# Patient Record
Sex: Male | Born: 1950
Health system: Southern US, Community
[De-identification: ages and names within clinical notes are randomized; demographics above are authoritative.]

## PROBLEM LIST (undated history)

## (undated) DIAGNOSIS — I10 Essential (primary) hypertension: Secondary | ICD-10-CM

## (undated) DIAGNOSIS — N182 Chronic kidney disease, stage 2 (mild): Secondary | ICD-10-CM

## (undated) DIAGNOSIS — I255 Ischemic cardiomyopathy: Secondary | ICD-10-CM

## (undated) DIAGNOSIS — I2510 Atherosclerotic heart disease of native coronary artery without angina pectoris: Secondary | ICD-10-CM

## (undated) DIAGNOSIS — I739 Peripheral vascular disease, unspecified: Secondary | ICD-10-CM

## (undated) DIAGNOSIS — E119 Type 2 diabetes mellitus without complications: Secondary | ICD-10-CM

## (undated) DIAGNOSIS — I714 Abdominal aortic aneurysm, without rupture, unspecified: Secondary | ICD-10-CM

## (undated) DIAGNOSIS — E785 Hyperlipidemia, unspecified: Secondary | ICD-10-CM

## (undated) HISTORY — DX: Hyperlipidemia, unspecified: E78.5

## (undated) HISTORY — DX: Ischemic cardiomyopathy: I25.5

## (undated) HISTORY — DX: Abdominal aortic aneurysm, without rupture, unspecified: I71.40

## (undated) HISTORY — DX: Atherosclerotic heart disease of native coronary artery without angina pectoris: I25.10

## (undated) HISTORY — DX: Peripheral vascular disease, unspecified: I73.9

## (undated) HISTORY — DX: Essential (primary) hypertension: I10

## (undated) HISTORY — DX: Type 2 diabetes mellitus without complications: E11.9

---

## 2011-09-16 ENCOUNTER — Emergency Department: Payer: Self-pay | Admitting: Emergency Medicine

## 2015-07-20 ENCOUNTER — Telehealth: Payer: Self-pay | Admitting: Family Medicine

## 2015-07-20 NOTE — Telephone Encounter (Signed)
Last seen 08-04-14: patient is requesting refill on Metformin, Losartan, and Amlodipine. Requesting that you send to [facility] (he will be home tomorrow). Informed patient that he should schedule an appointment but he said he works out of town.

## 2015-07-21 NOTE — Telephone Encounter (Signed)
Daniel Fuentes spoke with patient and he stated that he need a Friday appointment that he will call back later

## 2015-07-21 NOTE — Telephone Encounter (Signed)
Must be seen

## 2015-08-21 ENCOUNTER — Ambulatory Visit (INDEPENDENT_AMBULATORY_CARE_PROVIDER_SITE_OTHER): Payer: BLUE CROSS/BLUE SHIELD | Admitting: Family Medicine

## 2015-08-21 ENCOUNTER — Encounter: Payer: Self-pay | Admitting: Family Medicine

## 2015-08-21 VITALS — BP 120/70 | HR 92 | Temp 98.0°F | Resp 16 | Ht 68.0 in | Wt 203.4 lb

## 2015-08-21 DIAGNOSIS — E1129 Type 2 diabetes mellitus with other diabetic kidney complication: Secondary | ICD-10-CM | POA: Diagnosis not present

## 2015-08-21 DIAGNOSIS — E1169 Type 2 diabetes mellitus with other specified complication: Secondary | ICD-10-CM | POA: Diagnosis not present

## 2015-08-21 DIAGNOSIS — I10 Essential (primary) hypertension: Secondary | ICD-10-CM

## 2015-08-21 DIAGNOSIS — Z23 Encounter for immunization: Secondary | ICD-10-CM

## 2015-08-21 DIAGNOSIS — E785 Hyperlipidemia, unspecified: Secondary | ICD-10-CM

## 2015-08-21 DIAGNOSIS — H6121 Impacted cerumen, right ear: Secondary | ICD-10-CM

## 2015-08-21 DIAGNOSIS — H918X1 Other specified hearing loss, right ear: Secondary | ICD-10-CM | POA: Diagnosis not present

## 2015-08-21 DIAGNOSIS — R809 Proteinuria, unspecified: Secondary | ICD-10-CM

## 2015-08-21 DIAGNOSIS — E1121 Type 2 diabetes mellitus with diabetic nephropathy: Secondary | ICD-10-CM | POA: Insufficient documentation

## 2015-08-21 LAB — POCT UA - MICROALBUMIN: Microalbumin Ur, POC: 100 mg/L

## 2015-08-21 LAB — POCT GLYCOSYLATED HEMOGLOBIN (HGB A1C): Hemoglobin A1C: 6.7

## 2015-08-21 LAB — GLUCOSE, POCT (MANUAL RESULT ENTRY): POC Glucose: 144 mg/dl — AB (ref 70–99)

## 2015-08-21 MED ORDER — LOSARTAN POTASSIUM-HCTZ 100-25 MG PO TABS: 1.0000 | ORAL_TABLET | Freq: Every day | ORAL | Status: DC

## 2015-08-21 MED ORDER — AMLODIPINE BESYLATE 10 MG PO TABS: 10.0000 mg | ORAL_TABLET | Freq: Every day | ORAL | Status: DC

## 2015-08-21 MED ORDER — METFORMIN HCL 1000 MG PO TABS: 1000.0000 mg | ORAL_TABLET | Freq: Two times a day (BID) | ORAL | Status: DC

## 2015-08-21 NOTE — Progress Notes (Signed)
Name: Daniel Fuentes   MRN: [id number]    DOB: 1951/09/10   Date:08/21/2015       Progress Note  Subjective  Chief Complaint  Chief Complaint  Patient presents with  . Hypertension    6 month recheck  . Diabetes  . Hyperlipidemia    HPI  Diabetes  Patient presents for follow-up of diabetes which is present for over 5 years. Is currently on a regimen of metformin 1000 mg daily . Patient states some compliance with their diet and exercise. There's been no hypoglycemic episodes and there is no polyuria polydipsia polyphagia. His average fasting glucoses been in the low around -- with a high around - . There is no end organ disease.  Last diabetic eye exam was one year ago.   Last visit with dietitian was ordered in 1 year ago. . Last microalbumin was obtained today and is 100 .   Hyperlipidemia  Patient has a history of hyperlipidemia for over 5 years.  Current medical regimen consist of  .  Com simvastatin 20 mg daily at bedtimepliaGood .  Diet and exercise are currently followeintermittently Risk factors for cardiovascular disease include hyperlidiabetes and hypertension  There have been no side effects from the medication.    Hypertension   Patient presents for follow-up of hypertension. It has been present for oveover 5ars.  Patient states that there is compliance with medical regimen which consistlosartan HCT 100-25 once daily and amlodipine 10 mg daily . There is no end organ disease. Cardiac risk factors include hypertension hyperlipidemia and diabetes.  Exercise regimen consistminimal walking low-salt .  Diet consist of some salt restriction  Cerumen impaction  Patient complains of bilateral ear fullness. He is using earwax softener on the left. He has noted a decrease in his hearing. This been present over the past several months.    Past Medical History  Diagnosis Date  . Hyperlipidemia   . Hypertension   . Diabetes mellitus without complication [facility])     Social History   Substance Use Topics  . Smoking status: Current Some Day Smoker  . Smokeless tobacco: Never Used  . Alcohol Use: No     Current outpatient prescriptions:  .  amLODipine (NORVASC) 10 MG tablet, Take 1 tablet (10 mg total) by mouth daily., Disp: 30 tablet, Rfl: 5 .  losartan-hydrochlorothiazide (HYZAAR) 100-25 MG tablet, Take 1 tablet by mouth daily., Disp: 30 tablet, Rfl: 5 .  metFORMIN (GLUCOPHAGE) 1000 MG tablet, Take 1 tablet (1,000 mg total) by mouth 2 (two) times daily with a meal., Disp: 60 tablet, Rfl: 5  No Known Allergies  Review of Systems  Constitutional: Negative for fever, chills and weight loss.  HENT: Positive for hearing loss. Negative for congestion, sore throat and tinnitus.   Eyes: Negative for blurred vision, double vision and redness.  Respiratory: Negative for cough, hemoptysis and shortness of breath.   Cardiovascular: Negative for chest pain, palpitations, orthopnea, claudication and leg swelling.  Gastrointestinal: Negative for heartburn, nausea, vomiting, diarrhea, constipation and blood in stool.  Genitourinary: Negative for dysuria, urgency, frequency and hematuria.  Musculoskeletal: Negative for myalgias, back pain, joint pain, falls and neck pain.  Skin: Negative for itching.  Neurological: Negative for dizziness, tingling, tremors, focal weakness, seizures, loss of consciousness, weakness and headaches.  Endo/Heme/Allergies: Does not bruise/bleed easily.  Psychiatric/Behavioral: Negative for depression and substance abuse. The patient is not nervous/anxious and does not have insomnia.      Objective  Filed Vitals:   08/21/15 [id number]  BP: 120/70  Pulse: 92  Temp: 98 F (36.7 C)  TempSrc: Oral  Resp: 16  Height: 5\' 8"  (1.727 m)  Weight: 203 lb 6.4 oz (92.262 kg)  SpO2: 98%     Physical Exam  Constitutional: He is oriented to person, place, and time.  Obese and in no acute distress  HENT:  Head: Normocephalic.  Bilateral cerumen impaction   Eyes: EOM are normal. Pupils are equal, round, and reactive to light.  Neck: Normal range of motion. Neck supple. No thyromegaly present.  Cardiovascular: Normal rate, regular rhythm, normal heart sounds and intact distal pulses.   No murmur heard. Pulmonary/Chest: Effort normal and breath sounds normal. No respiratory distress. He has no wheezes.  Abdominal: Soft. Bowel sounds are normal.  Musculoskeletal: Normal range of motion. He exhibits no edema.  Lymphadenopathy:    He has no cervical adenopathy.  Neurological: He is alert and oriented to person, place, and time. No cranial nerve deficit. Gait normal. Coordination normal.  Skin: Skin is warm and dry. No rash noted.  Psychiatric: Affect and judgment normal.      Assessment & Plan   1. Type 2 diabetes mellitus with other specified complication (HCC) Well-controlled - POCT HgB A1C - POCT Glucose (CBG) - POCT UA - Microalbumin - PSA  2. Need for influenza vaccination Given today - Flu vaccine HIGH DOSE PF (Fluzone High dose)  3. Diabetes mellitus with microalbuminuria (HCC) Continue on and aggressive hypertension and diabetes control  4. Essential hypertension Well-controlled - Comprehensive metabolic panel  5. Hyperlipidemia Repeat labs - Lipid panel - TSH  6. Hearing loss of right ear due to cerumen impaction Ear lavage

## 2015-08-22 LAB — COMPREHENSIVE METABOLIC PANEL
ALT: 21 IU/L (ref 0–44)
AST: 43 IU/L — AB (ref 0–40)
Albumin/Globulin Ratio: 1.9 (ref 1.1–2.5)
Albumin: 4.7 g/dL (ref 3.6–4.8)
Alkaline Phosphatase: 77 IU/L (ref 39–117)
BUN/Creatinine Ratio: 15 (ref 10–22)
BUN: 18 mg/dL (ref 8–27)
Bilirubin Total: 0.9 mg/dL (ref 0.0–1.2)
CALCIUM: 10.1 mg/dL (ref 8.6–10.2)
CO2: 25 mmol/L (ref 18–29)
Chloride: 97 mmol/L (ref 97–106)
Creatinine, Ser: 1.21 mg/dL (ref 0.76–1.27)
GFR, EST AFRICAN AMERICAN: 72 mL/min/{1.73_m2} (ref >59)
GFR, EST NON AFRICAN AMERICAN: 62 mL/min/{1.73_m2} (ref >59)
GLUCOSE: 133 mg/dL — AB (ref 65–99)
Globulin, Total: 2.5 g/dL (ref 1.5–4.5)
Potassium: 4.1 mmol/L (ref 3.5–5.2)
Sodium: 138 mmol/L (ref 136–144)
TOTAL PROTEIN: 7.2 g/dL (ref 6.0–8.5)

## 2015-08-22 LAB — LIPID PANEL
CHOL/HDL RATIO: 4.4 ratio (ref 0.0–5.0)
Cholesterol, Total: 231 mg/dL — ABNORMAL HIGH (ref 100–199)
HDL: 52 mg/dL (ref >39)
LDL Calculated: 156 mg/dL — ABNORMAL HIGH (ref 0–99)
TRIGLYCERIDES: 117 mg/dL (ref 0–149)
VLDL Cholesterol Cal: 23 mg/dL (ref 5–40)

## 2015-08-22 LAB — PSA: Prostate Specific Ag, Serum: 3.3 ng/mL (ref 0.0–4.0)

## 2015-08-22 LAB — TSH: TSH: 0.983 u[IU]/mL (ref 0.450–4.500)

## 2015-08-28 ENCOUNTER — Other Ambulatory Visit: Payer: Self-pay

## 2015-08-28 MED ORDER — LOSARTAN POTASSIUM-HCTZ 100-25 MG PO TABS: 1.0000 | ORAL_TABLET | Freq: Every day | ORAL | Status: DC

## 2015-08-28 MED ORDER — METFORMIN HCL 1000 MG PO TABS: 1000.0000 mg | ORAL_TABLET | Freq: Two times a day (BID) | ORAL | Status: DC

## 2015-08-30 ENCOUNTER — Telehealth: Payer: Self-pay | Admitting: Emergency Medicine

## 2015-08-30 NOTE — Telephone Encounter (Signed)
Patient notified of lab results. Had been without medication

## 2015-09-07 ENCOUNTER — Telehealth: Payer: Self-pay | Admitting: Family Medicine

## 2015-09-07 NOTE — Telephone Encounter (Signed)
Pt would like a call back

## 2015-09-13 ENCOUNTER — Telehealth: Payer: Self-pay | Admitting: Family Medicine

## 2015-09-13 NOTE — Telephone Encounter (Signed)
Pt would like a call back

## 2015-09-14 MED ORDER — SILDENAFIL CITRATE 100 MG PO TABS: 50.0000 mg | ORAL_TABLET | Freq: Every day | ORAL | Status: DC | PRN

## 2015-09-14 NOTE — Telephone Encounter (Signed)
Patient called and would like a script sent to Pharmacy for Viagra. Script sent per Dr. Rutherford Nail. Patient notified

## 2015-10-01 HISTORY — PX: BRAIN SURGERY: SHX531

## 2015-11-06 ENCOUNTER — Encounter: Payer: Self-pay | Admitting: Family Medicine

## 2015-12-22 ENCOUNTER — Ambulatory Visit: Payer: BLUE CROSS/BLUE SHIELD | Admitting: Family Medicine

## 2016-03-21 ENCOUNTER — Ambulatory Visit (INDEPENDENT_AMBULATORY_CARE_PROVIDER_SITE_OTHER): Payer: BLUE CROSS/BLUE SHIELD | Admitting: Family Medicine

## 2016-03-21 ENCOUNTER — Encounter: Payer: Self-pay | Admitting: Family Medicine

## 2016-03-21 VITALS — BP 128/78 | HR 78 | Temp 98.5°F | Resp 18 | Ht 68.0 in | Wt 197.3 lb

## 2016-03-21 DIAGNOSIS — J309 Allergic rhinitis, unspecified: Secondary | ICD-10-CM | POA: Diagnosis not present

## 2016-03-21 DIAGNOSIS — I10 Essential (primary) hypertension: Secondary | ICD-10-CM | POA: Diagnosis not present

## 2016-03-21 DIAGNOSIS — E66811 Obesity, class 1: Secondary | ICD-10-CM | POA: Insufficient documentation

## 2016-03-21 DIAGNOSIS — Z720 Tobacco use: Secondary | ICD-10-CM | POA: Diagnosis not present

## 2016-03-21 DIAGNOSIS — F17200 Nicotine dependence, unspecified, uncomplicated: Secondary | ICD-10-CM

## 2016-03-21 DIAGNOSIS — E119 Type 2 diabetes mellitus without complications: Secondary | ICD-10-CM | POA: Diagnosis not present

## 2016-03-21 DIAGNOSIS — E669 Obesity, unspecified: Secondary | ICD-10-CM

## 2016-03-21 LAB — LIPID PANEL
CHOL/HDL RATIO: 4.4 ratio (ref <=5.0)
Cholesterol: 200 mg/dL (ref 125–200)
HDL: 45 mg/dL (ref >=40)
LDL CALC: 135 mg/dL — AB (ref <130)
Triglycerides: 102 mg/dL (ref <150)
VLDL: 20 mg/dL (ref <30)

## 2016-03-21 LAB — COMPREHENSIVE METABOLIC PANEL
ALT: 13 U/L (ref 9–46)
AST: 36 U/L — ABNORMAL HIGH (ref 10–35)
Albumin: 4.2 g/dL (ref 3.6–5.1)
Alkaline Phosphatase: 57 U/L (ref 40–115)
BUN: 23 mg/dL (ref 7–25)
CHLORIDE: 104 mmol/L (ref 98–110)
CO2: 25 mmol/L (ref 20–31)
Calcium: 9.2 mg/dL (ref 8.6–10.3)
Creat: 1.42 mg/dL — ABNORMAL HIGH (ref 0.70–1.25)
GLUCOSE: 109 mg/dL — AB (ref 65–99)
POTASSIUM: 4.2 mmol/L (ref 3.5–5.3)
Sodium: 138 mmol/L (ref 135–146)
Total Bilirubin: 0.8 mg/dL (ref 0.2–1.2)
Total Protein: 6.7 g/dL (ref 6.1–8.1)

## 2016-03-21 LAB — GLUCOSE, POCT (MANUAL RESULT ENTRY): POC Glucose: 115 mg/dl — AB (ref 70–99)

## 2016-03-21 LAB — VITAMIN B12: Vitamin B-12: 1059 pg/mL (ref 200–1100)

## 2016-03-21 LAB — POCT GLYCOSYLATED HEMOGLOBIN (HGB A1C): Hemoglobin A1C: 6.7

## 2016-03-21 MED ORDER — AMLODIPINE BESYLATE 10 MG PO TABS: 10.0000 mg | ORAL_TABLET | Freq: Every day | ORAL | Status: DC

## 2016-03-21 MED ORDER — LOSARTAN POTASSIUM-HCTZ 100-25 MG PO TABS
1.0000 | ORAL_TABLET | Freq: Every day | ORAL | Status: DC
Start: 2016-03-21 — End: 2017-04-10

## 2016-03-21 MED ORDER — METFORMIN HCL 1000 MG PO TABS: 1000.0000 mg | ORAL_TABLET | Freq: Two times a day (BID) | ORAL | Status: DC

## 2016-03-22 LAB — MICROALBUMIN / CREATININE URINE RATIO
Creatinine, Urine: 278 mg/dL (ref 20–370)
MICROALB UR: 1.9 mg/dL — AB
MICROALB/CREAT RATIO: 7 ug/mg{creat} (ref <30)

## 2016-03-22 LAB — VITAMIN D 25 HYDROXY (VIT D DEFICIENCY, FRACTURES): Vit D, 25-Hydroxy: 43 ng/mL (ref 30–100)

## 2016-03-23 DIAGNOSIS — F17200 Nicotine dependence, unspecified, uncomplicated: Secondary | ICD-10-CM | POA: Insufficient documentation

## 2016-03-23 NOTE — Progress Notes (Signed)
Date:  03/21/2016   Name:  Daniel Fuentes   DOB:  [birth year]   MRN:  [id number]  PCP:  Ashok Norris, MD    Chief Complaint: Diabetes and Hypertension   History of Present Illness:  This is a 65 y.o. male with chest congestion and NP cough past 3-4 weeks, no other URI sxs, Robitussin helps, improving. HTN on amlodipine and Hyzaar, DM on metformin, ED on Viagra, helps. Declines statin use due to myalgias in past. Smoker, understands need to quit.  Review of Systems:  Review of Systems  Constitutional: Negative for fever.  Respiratory: Negative for shortness of breath.   Cardiovascular: Negative for chest pain and leg swelling.  Endocrine: Negative for polyuria.  Genitourinary: Negative for difficulty urinating.  Neurological: Negative for syncope and light-headedness.    Patient Active Problem List   Diagnosis Date Noted  . Smoker 03/23/2016  . Obesity, Class I, BMI 30-34.9 03/21/2016  . Allergic rhinitis 03/21/2016  . Controlled diabetes mellitus with nephropathy ([place]) 08/21/2015  . Hypertension 08/21/2015    Prior to Admission medications   Medication Sig Start Date End Date Taking? Authorizing Provider  amLODipine (NORVASC) 10 MG tablet Take 1 tablet (10 mg total) by mouth daily. 03/21/16   Adline Potter, MD  losartan-hydrochlorothiazide (HYZAAR) 100-25 MG tablet Take 1 tablet by mouth daily. 03/21/16   Adline Potter, MD  metFORMIN (GLUCOPHAGE) 1000 MG tablet Take 1 tablet (1,000 mg total) by mouth 2 (two) times daily with a meal. 03/21/16   Adline Potter, MD  sildenafil (VIAGRA) 100 MG tablet Take 0.5-1 tablets (50-100 mg total) by mouth daily as needed for erectile dysfunction. 09/14/15   Ashok Norris, MD    No Known Allergies  Past Surgical History  Procedure Laterality Date  . No past surgeries      Social History  Substance Use Topics  . Smoking status: Current Some Day Smoker  . Smokeless tobacco: Never Used  . Alcohol Use: No    No family history on  file.  Medication list has been reviewed and updated.  Physical Examination: BP 128/78 mmHg  Pulse 78  Temp(Src) 98.5 F (36.9 C)  Resp 18  Ht 5\' 8"  (1.727 m)  Wt 197 lb 5 oz (89.5 kg)  BMI 30.01 kg/m2  SpO2 97%  Physical Exam  Constitutional: He appears well-developed and well-nourished.  HENT:  Mouth/Throat: Oropharynx is clear and moist.  Cardiovascular: Normal rate, regular rhythm and normal heart sounds.   Pulmonary/Chest: Effort normal and breath sounds normal.  Musculoskeletal: He exhibits no edema.  Lymphadenopathy:    He has no cervical adenopathy.  Neurological: He is alert.  Skin: Skin is warm and dry.  Psychiatric: He has a normal mood and affect. His behavior is normal.  Nursing note and vitals reviewed.   Assessment and Plan:  1. Controlled type 2 diabetes mellitus without complication, without long-term current use of insulin (HCC) A1c 6.7% today, cont metformin - POCT HgB A1C - POCT Glucose (CBG) - Lipid Profile - B12 - Urine Microalbumin w/creat. ratio  2. Essential hypertension Well controlled on amlodipine/Hyzaar, refill - Comprehensive metabolic panel  3. Allergic rhinitis, unspecified allergic rhinitis type Consider OTC antihistamine  4. Obesity, Class I, BMI 30-34.9 Exercise/weight loss discussed - Vitamin D (25 hydroxy)  5. Smoker Strongly advised cessation  Return in about 3 months (around 06/21/2016).  Satira Anis. [facility]  03/23/2016

## 2016-03-27 ENCOUNTER — Telehealth: Payer: Self-pay | Admitting: Family Medicine

## 2016-03-27 NOTE — Telephone Encounter (Signed)
Pt asking that you call him. Said that you were to set him up for an appt but he has not heard anything yet. Also said he has not heard about labs. Said needs to speak with you. Said that you had called him

## 2016-04-29 ENCOUNTER — Encounter: Payer: Self-pay | Admitting: Emergency Medicine

## 2016-04-29 ENCOUNTER — Emergency Department: Payer: BLUE CROSS/BLUE SHIELD

## 2016-04-29 ENCOUNTER — Emergency Department
Admission: EM | Admit: 2016-04-29 | Discharge: 2016-04-29 | Payer: BLUE CROSS/BLUE SHIELD | Attending: Emergency Medicine | Admitting: Emergency Medicine

## 2016-04-29 DIAGNOSIS — S065X9A Traumatic subdural hemorrhage with loss of consciousness of unspecified duration, initial encounter: Secondary | ICD-10-CM | POA: Diagnosis not present

## 2016-04-29 DIAGNOSIS — E1121 Type 2 diabetes mellitus with diabetic nephropathy: Secondary | ICD-10-CM | POA: Insufficient documentation

## 2016-04-29 DIAGNOSIS — I6202 Nontraumatic subacute subdural hemorrhage: Secondary | ICD-10-CM | POA: Diagnosis not present

## 2016-04-29 DIAGNOSIS — I6200 Nontraumatic subdural hemorrhage, unspecified: Secondary | ICD-10-CM | POA: Diagnosis not present

## 2016-04-29 DIAGNOSIS — E119 Type 2 diabetes mellitus without complications: Secondary | ICD-10-CM | POA: Diagnosis not present

## 2016-04-29 DIAGNOSIS — S065X0A Traumatic subdural hemorrhage without loss of consciousness, initial encounter: Secondary | ICD-10-CM | POA: Diagnosis not present

## 2016-04-29 DIAGNOSIS — Y999 Unspecified external cause status: Secondary | ICD-10-CM | POA: Insufficient documentation

## 2016-04-29 DIAGNOSIS — G8191 Hemiplegia, unspecified affecting right dominant side: Secondary | ICD-10-CM | POA: Diagnosis not present

## 2016-04-29 DIAGNOSIS — G9389 Other specified disorders of brain: Secondary | ICD-10-CM | POA: Diagnosis not present

## 2016-04-29 DIAGNOSIS — Z87891 Personal history of nicotine dependence: Secondary | ICD-10-CM | POA: Diagnosis not present

## 2016-04-29 DIAGNOSIS — R531 Weakness: Secondary | ICD-10-CM | POA: Diagnosis not present

## 2016-04-29 DIAGNOSIS — W228XXA Striking against or struck by other objects, initial encounter: Secondary | ICD-10-CM | POA: Diagnosis not present

## 2016-04-29 DIAGNOSIS — I4581 Long QT syndrome: Secondary | ICD-10-CM | POA: Diagnosis not present

## 2016-04-29 DIAGNOSIS — Z7984 Long term (current) use of oral hypoglycemic drugs: Secondary | ICD-10-CM | POA: Insufficient documentation

## 2016-04-29 DIAGNOSIS — S0990XA Unspecified injury of head, initial encounter: Secondary | ICD-10-CM | POA: Diagnosis not present

## 2016-04-29 DIAGNOSIS — I10 Essential (primary) hypertension: Secondary | ICD-10-CM | POA: Diagnosis not present

## 2016-04-29 DIAGNOSIS — F17200 Nicotine dependence, unspecified, uncomplicated: Secondary | ICD-10-CM | POA: Insufficient documentation

## 2016-04-29 DIAGNOSIS — G935 Compression of brain: Secondary | ICD-10-CM | POA: Diagnosis not present

## 2016-04-29 DIAGNOSIS — Y929 Unspecified place or not applicable: Secondary | ICD-10-CM | POA: Insufficient documentation

## 2016-04-29 DIAGNOSIS — R200 Anesthesia of skin: Secondary | ICD-10-CM | POA: Diagnosis present

## 2016-04-29 DIAGNOSIS — Y939 Activity, unspecified: Secondary | ICD-10-CM | POA: Insufficient documentation

## 2016-04-29 DIAGNOSIS — Z6828 Body mass index (BMI) 28.0-28.9, adult: Secondary | ICD-10-CM | POA: Diagnosis not present

## 2016-04-29 DIAGNOSIS — Z79899 Other long term (current) drug therapy: Secondary | ICD-10-CM | POA: Diagnosis not present

## 2016-04-29 LAB — CBC
HCT: 39.1 % — ABNORMAL LOW (ref 40.0–52.0)
Hemoglobin: 13.8 g/dL (ref 13.0–18.0)
MCH: 30.9 pg (ref 26.0–34.0)
MCHC: 35.2 g/dL (ref 32.0–36.0)
MCV: 87.7 fL (ref 80.0–100.0)
PLATELETS: 305 10*3/uL (ref 150–440)
RBC: 4.46 MIL/uL (ref 4.40–5.90)
RDW: 14.7 % — AB (ref 11.5–14.5)
WBC: 5.8 10*3/uL (ref 3.8–10.6)

## 2016-04-29 LAB — COMPREHENSIVE METABOLIC PANEL
ALK PHOS: 65 U/L (ref 38–126)
ALT: 15 U/L — ABNORMAL LOW (ref 17–63)
ANION GAP: 5 (ref 5–15)
AST: 40 U/L (ref 15–41)
Albumin: 4.3 g/dL (ref 3.5–5.0)
BUN: 17 mg/dL (ref 6–20)
CALCIUM: 9.4 mg/dL (ref 8.9–10.3)
CO2: 25 mmol/L (ref 22–32)
Chloride: 108 mmol/L (ref 101–111)
Creatinine, Ser: 1.11 mg/dL (ref 0.61–1.24)
Glucose, Bld: 103 mg/dL — ABNORMAL HIGH (ref 65–99)
Potassium: 3.6 mmol/L (ref 3.5–5.1)
SODIUM: 138 mmol/L (ref 135–145)
TOTAL PROTEIN: 7.3 g/dL (ref 6.5–8.1)
Total Bilirubin: 1 mg/dL (ref 0.3–1.2)

## 2016-04-29 LAB — DIFFERENTIAL
BASOS PCT: 1 %
Basophils Absolute: 0 10*3/uL (ref 0–0.1)
EOS PCT: 1 %
Eosinophils Absolute: 0 10*3/uL (ref 0–0.7)
LYMPHS PCT: 29 %
Lymphs Abs: 1.7 10*3/uL (ref 1.0–3.6)
MONO ABS: 0.5 10*3/uL (ref 0.2–1.0)
MONOS PCT: 9 %
Neutro Abs: 3.6 10*3/uL (ref 1.4–6.5)
Neutrophils Relative %: 60 %

## 2016-04-29 LAB — PROTIME-INR
INR: 0.99
PROTHROMBIN TIME: 13.1 s (ref 11.4–15.2)

## 2016-04-29 LAB — TROPONIN I

## 2016-04-29 LAB — APTT: aPTT: 42 seconds — ABNORMAL HIGH (ref 24–36)

## 2016-04-29 NOTE — ED Notes (Signed)
Waiting on transport to [facility]. Alert and oriented. Reminded again cannot eat. Moving all extremities.

## 2016-04-29 NOTE — ED Triage Notes (Signed)
States difficulty with speech, speech noted slightly slow, R leg weakness x 1 week. Smile symmetrical. Grips equal and denies sensation of weakness when gripping. Denies headache.

## 2016-04-29 NOTE — ED Provider Notes (Signed)
[facility] Emergency Department Provider Note    ____________________________________________   I have reviewed the triage vital signs and the nursing notes.   HISTORY  Chief Complaint Weakness   History limited by: Not Limited   HPI Daniel Fuentes is a 65 y.o. male who presents to the emergency department today because of one week of increasing right-sided numbness. The patient thinks that he might of hit his head against a car door frame roughly 1 week ago when the symptoms started. He did not lose consciousness at that time. He has had some dizziness associated with the numbness for the past week.He is also had some slowness of his speech. He denies any headaches.    Past Medical History:  Diagnosis Date  . Diabetes mellitus without complication ([place])   . Hyperlipidemia   . Hypertension     Patient Active Problem List   Diagnosis Date Noted  . Smoker 03/23/2016  . Obesity, Class I, BMI 30-34.9 03/21/2016  . Allergic rhinitis 03/21/2016  . Controlled diabetes mellitus with nephropathy ([place]) 08/21/2015  . Hypertension 08/21/2015    Past Surgical History:  Procedure Laterality Date  . NO PAST SURGERIES      Prior to Admission medications   Medication Sig Start Date End Date Taking? Authorizing Provider  amLODipine (NORVASC) 10 MG tablet Take 1 tablet (10 mg total) by mouth daily. 03/21/16   Adline Potter, MD  losartan-hydrochlorothiazide (HYZAAR) 100-25 MG tablet Take 1 tablet by mouth daily. 03/21/16   Adline Potter, MD  metFORMIN (GLUCOPHAGE) 1000 MG tablet Take 1 tablet (1,000 mg total) by mouth 2 (two) times daily with a meal. 03/21/16   Adline Potter, MD  sildenafil (VIAGRA) 100 MG tablet Take 0.5-1 tablets (50-100 mg total) by mouth daily as needed for erectile dysfunction. 09/14/15   Ashok Norris, MD    Allergies Review of patient's allergies indicates no known allergies.  No family history on file.  Social History Social  History  Substance Use Topics  . Smoking status: Current Some Day Smoker  . Smokeless tobacco: Never Used  . Alcohol use No    Review of Systems  Constitutional: Negative for fever. Cardiovascular: Negative for chest pain. Respiratory: Negative for shortness of breath. Gastrointestinal: Negative for abdominal pain, vomiting and diarrhea. Neurological: Negative for headaches. Positive for numbness of the right side and slow speech.  10-point ROS otherwise negative.  ____________________________________________   PHYSICAL EXAM:  VITAL SIGNS: ED Triage Vitals [04/29/16 1346]  Enc Vitals Group     BP 139/81     Pulse Rate 69     Resp 20     Temp 98.6 F (37 C)     Temp Source Oral     SpO2 94 %     Weight 205 lb (93 kg)     Height 5\' 9"  (1.753 m)   Constitutional: Alert and oriented. Well appearing and in no distress. Eyes: Conjunctivae are normal. PERRL. Normal extraocular movements. ENT   Head: Normocephalic and atraumatic.   Nose: No congestion/rhinnorhea.   Mouth/Throat: Mucous membranes are moist.   Neck: No stridor. Hematological/Lymphatic/Immunilogical: No cervical lymphadenopathy. Cardiovascular: Normal rate, regular rhythm.  No murmurs, rubs, or gallops. Respiratory: Normal respiratory effort without tachypnea nor retractions. Breath sounds are clear and equal bilaterally. No wheezes/rales/rhonchi. Gastrointestinal: Soft and nontender. No distention.  Genitourinary: Deferred Musculoskeletal: Normal range of motion in all extremities. No joint effusions.  No lower extremity tenderness nor edema. Neurologic:  Speech appears slightly slow. Face symmetric. Strength  5/5 in upper and lower extremities. Subjective numbness to right side. No gross focal neurologic deficits are appreciated.  Skin:  Skin is warm, dry and intact. No rash noted. Psychiatric: Mood and affect are normal. Speech and behavior are normal. Patient exhibits appropriate insight and  judgment.  ____________________________________________    LABS (pertinent positives/negatives)  Labs Reviewed  APTT - Abnormal; Notable for the following:       Result Value   aPTT 42 (*)    All other components within normal limits  CBC - Abnormal; Notable for the following:    HCT 39.1 (*)    RDW 14.7 (*)    All other components within normal limits  COMPREHENSIVE METABOLIC PANEL - Abnormal; Notable for the following:    Glucose, Bld 103 (*)    ALT 15 (*)    All other components within normal limits  PROTIME-INR  DIFFERENTIAL  TROPONIN I     ____________________________________________   EKG  None  ____________________________________________    RADIOLOGY  CT head IMPRESSION: Large acute on subacute left subdural hemorrhage with associated 0.9 cm left right midline shift.  I, Nance Pear, personally discussed these images and results by phone with the on-call radiologist and used this discussion as part of my medical decision making.   ____________________________________________   PROCEDURES  .Critical Care Performed by: Nance Pear Authorized by: Nance Pear   Critical care provider statement:    Critical care time (minutes):  30   Critical care time was exclusive of:  Separately billable procedures and treating other patients   Critical care was necessary to treat or prevent imminent or life-threatening deterioration of the following conditions:  CNS failure or compromise   Critical care was time spent personally by me on the following activities:  Development of treatment plan with patient or surrogate, discussions with consultants, examination of patient, obtaining history from patient or surrogate, ordering and review of radiographic studies and re-evaluation of patient's condition     ____________________________________________   INITIAL IMPRESSION / ASSESSMENT AND PLAN / ED COURSE  Pertinent labs & imaging results that  were available during my care of the patient were reviewed by me and considered in my medical decision making (see chart for details).  Patient was admitted to the emergency department today because of concerns for some right-sided weakness, difficulty with speech. Head CT was concerning for subdural hemorrhage. At this point think it is possible is related to when the patient hit his head on the door. Given that the patient does have midline shift and septal hematoma will need to be transferred to a facility with neurosurgery capability. The patient requested [facility]. I did discuss with [facility] neurosurgery to is willing to accept the patient in transfer. ____________________________________________   FINAL CLINICAL IMPRESSION(S) / ED DIAGNOSES  Final diagnoses:  Subdural hemorrhage ([place])  Weakness     Note: This dictation was prepared with Dragon dictation. Any transcriptional errors that result from this process are unintentional    Nance Pear, MD 04/29/16 2126

## 2016-04-29 NOTE — ED Notes (Signed)
Pt hit call bell.  RN at bedside. Pt has pulled monitor off and urinated on floor/self despite urinal at bedside. Instructed pt not to get up and if he needs something to use call bell and wait for nurse. Urinal remains at bedside

## 2016-04-29 NOTE — ED Notes (Signed)
Pt remains alert and oriented. NAD. No new sx. No changes in current sx. Will continue to monitor.

## 2016-04-29 NOTE — ED Notes (Signed)
Remains to wait for transfer bed.

## 2016-04-29 NOTE — ED Notes (Signed)
Informed pt still cannot eat. No new symptoms. Still moving all extremities

## 2016-04-29 NOTE — ED Notes (Signed)
[facility] here for transport

## 2016-04-29 NOTE — ED Notes (Addendum)
Report given to [place] RN in neuro ICU at [facility]. Pt will go to [phone]

## 2016-04-30 DIAGNOSIS — I6202 Nontraumatic subacute subdural hemorrhage: Secondary | ICD-10-CM | POA: Diagnosis not present

## 2016-04-30 DIAGNOSIS — S065X0A Traumatic subdural hemorrhage without loss of consciousness, initial encounter: Secondary | ICD-10-CM | POA: Diagnosis not present

## 2016-04-30 DIAGNOSIS — Z87828 Personal history of other (healed) physical injury and trauma: Secondary | ICD-10-CM | POA: Insufficient documentation

## 2016-05-01 DIAGNOSIS — S065X0A Traumatic subdural hemorrhage without loss of consciousness, initial encounter: Secondary | ICD-10-CM | POA: Diagnosis not present

## 2016-05-01 DIAGNOSIS — I6200 Nontraumatic subdural hemorrhage, unspecified: Secondary | ICD-10-CM | POA: Diagnosis not present

## 2016-05-01 DIAGNOSIS — Z6828 Body mass index (BMI) 28.0-28.9, adult: Secondary | ICD-10-CM | POA: Diagnosis not present

## 2016-05-02 DIAGNOSIS — G935 Compression of brain: Secondary | ICD-10-CM | POA: Diagnosis not present

## 2016-05-02 DIAGNOSIS — I6200 Nontraumatic subdural hemorrhage, unspecified: Secondary | ICD-10-CM | POA: Diagnosis not present

## 2016-05-02 DIAGNOSIS — E119 Type 2 diabetes mellitus without complications: Secondary | ICD-10-CM | POA: Diagnosis not present

## 2016-05-02 DIAGNOSIS — S065X0A Traumatic subdural hemorrhage without loss of consciousness, initial encounter: Secondary | ICD-10-CM | POA: Diagnosis not present

## 2016-05-02 DIAGNOSIS — I10 Essential (primary) hypertension: Secondary | ICD-10-CM | POA: Diagnosis not present

## 2016-05-02 DIAGNOSIS — G9389 Other specified disorders of brain: Secondary | ICD-10-CM | POA: Diagnosis not present

## 2016-05-10 DIAGNOSIS — R29898 Other symptoms and signs involving the musculoskeletal system: Secondary | ICD-10-CM | POA: Diagnosis not present

## 2016-05-13 DIAGNOSIS — I6200 Nontraumatic subdural hemorrhage, unspecified: Secondary | ICD-10-CM | POA: Diagnosis not present

## 2016-06-24 DIAGNOSIS — I6203 Nontraumatic chronic subdural hemorrhage: Secondary | ICD-10-CM | POA: Diagnosis not present

## 2016-06-24 DIAGNOSIS — I6200 Nontraumatic subdural hemorrhage, unspecified: Secondary | ICD-10-CM | POA: Diagnosis not present

## 2016-08-01 ENCOUNTER — Telehealth: Payer: Self-pay | Admitting: Family Medicine

## 2016-08-01 NOTE — Telephone Encounter (Signed)
Dr Rutherford Nail pt has scheduled appointment for December. He is asking for a 90 day supply on losartan and amlodipine and metformin. Asking that you send to [email or web address]

## 2016-08-02 ENCOUNTER — Other Ambulatory Visit: Payer: Self-pay | Admitting: Family Medicine

## 2016-08-02 NOTE — Telephone Encounter (Signed)
Dr. Vicente Masson sent all three rx to his pharmacy in 06 for 90 days and 3 refills

## 2016-08-02 NOTE — Telephone Encounter (Signed)
Patient notified and called [facility] to go ahead and fill the prescribes he had one file.

## 2016-09-02 ENCOUNTER — Ambulatory Visit: Payer: BLUE CROSS/BLUE SHIELD | Admitting: Family Medicine

## 2016-09-04 ENCOUNTER — Encounter: Payer: Self-pay | Admitting: Family Medicine

## 2016-09-04 ENCOUNTER — Ambulatory Visit (INDEPENDENT_AMBULATORY_CARE_PROVIDER_SITE_OTHER): Payer: BLUE CROSS/BLUE SHIELD | Admitting: Family Medicine

## 2016-09-04 VITALS — BP 137/84 | HR 74 | Temp 98.1°F | Resp 16 | Ht 69.0 in | Wt 201.2 lb

## 2016-09-04 DIAGNOSIS — B353 Tinea pedis: Secondary | ICD-10-CM

## 2016-09-04 DIAGNOSIS — H6123 Impacted cerumen, bilateral: Secondary | ICD-10-CM | POA: Diagnosis not present

## 2016-09-04 DIAGNOSIS — I10 Essential (primary) hypertension: Secondary | ICD-10-CM

## 2016-09-04 DIAGNOSIS — E1169 Type 2 diabetes mellitus with other specified complication: Secondary | ICD-10-CM

## 2016-09-04 DIAGNOSIS — Z23 Encounter for immunization: Secondary | ICD-10-CM

## 2016-09-04 DIAGNOSIS — Z87820 Personal history of traumatic brain injury: Secondary | ICD-10-CM

## 2016-09-04 DIAGNOSIS — E1121 Type 2 diabetes mellitus with diabetic nephropathy: Secondary | ICD-10-CM

## 2016-09-04 DIAGNOSIS — E785 Hyperlipidemia, unspecified: Secondary | ICD-10-CM | POA: Diagnosis not present

## 2016-09-04 LAB — POCT GLYCOSYLATED HEMOGLOBIN (HGB A1C): HEMOGLOBIN A1C: 7.2

## 2016-09-04 MED ORDER — ASPIRIN EC 81 MG PO TBEC: 81.0000 mg | DELAYED_RELEASE_TABLET | Freq: Every day | ORAL | 0 refills | Status: DC

## 2016-09-04 MED ORDER — TERBINAFINE HCL 1 % EX CREA: 1.0000 "application " | TOPICAL_CREAM | Freq: Two times a day (BID) | CUTANEOUS | 0 refills | Status: DC

## 2016-09-04 NOTE — Progress Notes (Signed)
Name: Daniel Fuentes   MRN: [id number]    DOB: Dec 08, 1950   Date:09/04/2016       Progress Note  Subjective  Chief Complaint  Chief Complaint  Patient presents with  . Medication Refill    6 month F/U  . Diabetes    Patient does not check his sugars at home  . Hypertension    Denies any symptoms  . Ear Fullness    Bilateral ear fullness but worst on the left side.     HPI   DMII: he has microalbuminuria and also dyslipidemia, can't tolerate statin therapy and wants to hold off on starting Zetia. He is on ARB. He denies polyphagia but has polydipsia or polyuria. He is not checking glucose at home. He states he has been splurging on sweets during the holidays. He states eye exam is up to date  HTN: taking bp medication, denies chest pain or palpitation. He is not taking aspirin daily   History of sub-dural hematoma: hit his head on the car door frame in August 2017, developed slurred speech a few days later went to [facility] and was found to have a subdural hematoma that had to be drained. He had physical therapy ( one session ) to improve his balance and he has been doing well since.   Ear fullness: he states he has recurrent wax build up and has noticed some hearing loss from the left side and would like to have an ear lavage.   Patient Active Problem List   Diagnosis Date Noted  . History of recent traumatic injury of head 04/30/2016  . Smoker 03/23/2016  . Obesity, Class I, BMI 30-34.9 03/21/2016  . Allergic rhinitis 03/21/2016  . Controlled diabetes mellitus with nephropathy ([place]) 08/21/2015  . Hypertension 08/21/2015    Past Surgical History:  Procedure Laterality Date  . BRAIN SURGERY  2017   Subdural Hematoma  . NO PAST SURGERIES      Family History  Problem Relation Age of Onset  . Heart failure Mother   . Diabetes Mother   . Hypertension Mother   . Hyperlipidemia Mother   . Cancer Father     Brain  . Diabetes Sister   . Heart disease Brother     Heart Attack     Social History   Social History  . Marital status: Married    Spouse name: N/A  . Number of children: N/A  . Years of education: N/A   Occupational History  . Not on file.   Social History Main Topics  . Smoking status: Current Some Day Smoker    Years: 50.00    Types: Pipe    Start date: 09/04/1966  . Smokeless tobacco: Never Used  . Alcohol use 1.8 oz/week    3 Shots of liquor per week  . Drug use: No  . Sexual activity: Yes    Partners: Female   Other Topics Concern  . 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  amLODipine (NORVASC) 10 MG tablet, Take 1 tablet (10 mg total) by mouth daily., Disp: 90 tablet, Rfl: 3 .  B Complex Vitamins (VITAMIN-B COMPLEX) TABS, Take by mouth., Disp: , Rfl:  .  Cholecalciferol (VITAMIN D3) 2000 units capsule, Take by  mouth., Disp: , Rfl:  .  DOCOSAHEXAENOIC ACID PO, Take by mouth., Disp: , Rfl:  .  losartan-hydrochlorothiazide (HYZAAR) 100-25 MG tablet, Take 1 tablet by mouth daily., Disp: 90 tablet, Rfl: 3 .  metFORMIN (GLUCOPHAGE) 1000 MG tablet, Take 1 tablet (1,000 mg total) by mouth 2 (two) times daily with a meal., Disp: 180 tablet, Rfl: 3 .  Multiple Vitamins-Minerals (MULTIVITAMIN WITH MINERALS) tablet, Take by mouth., Disp: , Rfl:  .  sildenafil (VIAGRA) 100 MG tablet, Take 0.5-1 tablets (50-100 mg total) by mouth daily as needed for erectile dysfunction., Disp: 6 tablet, Rfl: 11 .  vitamin C (ASCORBIC ACID) 500 MG tablet, Take 500 mg by mouth., Disp: , Rfl:  .  terbinafine (LAMISIL AT) 1 % cream, Apply 1 application topically 2 (two) times daily., Disp: 30 g, Rfl: 0  No Known Allergies   ROS  Constitutional: Negative for fever or significant  weight change.  Respiratory: Negative for cough and shortness of breath.   Cardiovascular:  Negative for chest pain or palpitations.  Gastrointestinal: Negative for abdominal pain, no bowel changes.  Musculoskeletal: Negative for gait problem or joint swelling.  Skin: Negative for rash.  Neurological: Negative for dizziness or headache.  No other specific complaints in a complete review of systems (except as listed in HPI above).  Objective  Vitals:   09/04/16 1457  BP: 137/84  Pulse: 74  Resp: 16  Temp: 98.1 F (36.7 C)  TempSrc: Oral  SpO2: 97%  Weight: 201 lb 3.2 oz (91.3 kg)  Height: 5\' 9"  (1.753 m)    Body mass index is 29.71 kg/m.  Physical Exam  Constitutional: Patient appears well-developed and well-nourished. Obese  No distress.  HEENT: head atraumatic, normocephalic, pupils equal and reactive to light,  neck supple, throat within normal limits, ear wax bilaterally, left worse than right  Cardiovascular: Normal rate, regular rhythm and normal heart sounds.  No murmur heard. No BLE edema. Pulmonary/Chest: Effort normal and breath sounds normal. No respiratory distress. Abdominal: Soft.  There is no tenderness. Psychiatric: Patient has a normal mood and affect. behavior is normal. Judgment and thought content normal.  Recent Results (from the past 2160 hour(s))  POCT HgB A1C     Status: Abnormal   Collection Time: 09/04/16  3:08 PM  Result Value Ref Range   Hemoglobin A1C 7.2     Diabetic Foot Exam: Diabetic Foot Exam - Simple   Simple Foot Form Diabetic Foot exam was performed with the following findings:  Yes 09/04/2016  3:53 PM  Visual Inspection See comments:  Yes Sensation Testing Intact to touch and monofilament testing bilaterally:  Yes Pulse Check Posterior Tibialis and Dorsalis pulse intact bilaterally:  Yes Comments Thick and brittle toenails, maceration on toe webs      PHQ2/9: Depression screen [facility] 2/9 09/04/2016 08/21/2015  Decreased Interest 0 0  Down, Depressed, Hopeless 0 0  PHQ - 2 Score 0 0     Fall Risk: Fall Risk   09/04/2016 08/21/2015  Falls in the past year? Yes No  Number falls in past yr: 1 -  Injury with Fall? Yes -    Functional Status Survey: Is the patient deaf or have difficulty hearing?: No Does the patient have difficulty seeing, even when wearing glasses/contacts?: No Does the patient have difficulty concentrating, remembering, or making decisions?: No Does the patient have difficulty walking or climbing stairs?: No Does the patient have difficulty dressing or bathing?: No Does the patient have difficulty doing errands alone  such as visiting a doctor's office or shopping?: No    Assessment & Plan  1. Controlled diabetes mellitus with nephropathy (HCC)  - POCT HgB A1C  2. Needs flu shot  - Flu vaccine HIGH DOSE PF (Fluzone High dose)  3. Essential hypertension  Continue medication, bp is at goal   4. Dyslipidemia associated with type 2 diabetes mellitus (HCC)  LDL is not at goal - but he refuses to take statin  Discussed Zetia, but he wants to continue oatmeal shakes and recheck level  5. History of recent traumatic injury of head  Sub-dural hematoma - doing well   6. Tinea pedis of both feet  - terbinafine (LAMISIL AT) 1 % cream; Apply 1 application topically 2 (two) times daily.  Dispense: 30 g; Refill: 0  7. Need for pneumococcal vaccination  - Pneumococcal polysaccharide vaccine 23-valent greater than or equal to 2yo subcutaneous/IM   8. Bilateral impacted cerumen  - Ear Lavage Verbal consent given Possible side effects discussed with patient Ears were  lavaged with warm water and peroxide  Patient tolerated procedure well No complications

## 2016-09-04 NOTE — Progress Notes (Signed)
7

## 2016-09-05 ENCOUNTER — Other Ambulatory Visit: Payer: Self-pay

## 2016-09-05 NOTE — Telephone Encounter (Signed)
Pt stated that he forgot to ask for refills during his visit yesterday

## 2016-11-11 ENCOUNTER — Other Ambulatory Visit: Payer: Self-pay

## 2016-11-11 MED ORDER — SILDENAFIL CITRATE 100 MG PO TABS: 50.0000 mg | ORAL_TABLET | Freq: Every day | ORAL | 11 refills | Status: DC | PRN

## 2016-11-11 NOTE — Telephone Encounter (Signed)
Patient requesting refill of Viagra to [facility].

## 2016-11-19 ENCOUNTER — Other Ambulatory Visit: Payer: Self-pay

## 2016-11-19 NOTE — Progress Notes (Unsigned)
viagra

## 2016-11-21 ENCOUNTER — Other Ambulatory Visit: Payer: Self-pay | Admitting: Family Medicine

## 2016-11-21 MED ORDER — SILDENAFIL CITRATE 100 MG PO TABS: 50.0000 mg | ORAL_TABLET | Freq: Every day | ORAL | 3 refills | Status: DC | PRN

## 2016-11-21 NOTE — Telephone Encounter (Signed)
Pt needs refill on Viagra to be sent to [facility].

## 2016-12-11 ENCOUNTER — Other Ambulatory Visit: Payer: Self-pay

## 2016-12-12 MED ORDER — SILDENAFIL CITRATE 100 MG PO TABS: 100.0000 mg | ORAL_TABLET | Freq: Every day | ORAL | 0 refills | Status: DC | PRN

## 2017-01-06 ENCOUNTER — Ambulatory Visit (INDEPENDENT_AMBULATORY_CARE_PROVIDER_SITE_OTHER): Payer: BLUE CROSS/BLUE SHIELD | Admitting: Family Medicine

## 2017-01-06 ENCOUNTER — Encounter: Payer: Self-pay | Admitting: Family Medicine

## 2017-01-06 VITALS — BP 130/80 | HR 88 | Temp 98.2°F | Resp 16 | Ht 69.0 in | Wt 199.4 lb

## 2017-01-06 DIAGNOSIS — E1121 Type 2 diabetes mellitus with diabetic nephropathy: Secondary | ICD-10-CM

## 2017-01-06 DIAGNOSIS — I10 Essential (primary) hypertension: Secondary | ICD-10-CM

## 2017-01-06 DIAGNOSIS — E785 Hyperlipidemia, unspecified: Secondary | ICD-10-CM

## 2017-01-06 DIAGNOSIS — E1169 Type 2 diabetes mellitus with other specified complication: Secondary | ICD-10-CM

## 2017-01-06 DIAGNOSIS — Z87828 Personal history of other (healed) physical injury and trauma: Secondary | ICD-10-CM

## 2017-01-06 LAB — POCT GLYCOSYLATED HEMOGLOBIN (HGB A1C): Hemoglobin A1C: 6.8

## 2017-01-06 NOTE — Progress Notes (Signed)
Name: Daniel Fuentes   MRN: [id number]    DOB: Apr 05, 1951   Date:01/06/2017       Progress Note  Subjective  Chief Complaint  Chief Complaint  Patient presents with  . Diabetes    4 month follow up pt not checking blood sugar at home  . Hypertension    HPI  DMII: he has microalbuminuria and also dyslipidemia, can't tolerate statin therapy and wants to hold off on starting Zetia. He is on ARB. He denies polyphagia but has polydipsia or polyuria. He has nocturia ( seen by Urologist in the past - does not want to take medication for it)  He is not checking glucose at home. He is due for an eye exam. He is drinking green/oatmeal smoothies for breakfast. He has lost a couple of lbs since last visit. He is no longer obese, overweight now.   HTN: taking bp medication, denies chest pain or palpitation. He is not taking aspirin daily   History of sub-dural hematoma: hit his head on the car door frame in August 2017, developed slurred speech a few days later went to [facility] and was found to have a subdural hematoma that had to be drained. He had physical therapy ( one session ) to improve his balance and he has been doing well since. He states he is 100% back to normal now.   Smoking: he is smoking one cigarette a few times a week, explained importance of quitting.     Patient Active Problem List   Diagnosis Date Noted  . History of traumatic head injury 04/30/2016  . Smoker 03/23/2016  . Obesity, Class I, BMI 30-34.9 03/21/2016  . Allergic rhinitis 03/21/2016  . Controlled diabetes mellitus with nephropathy ([place]) 08/21/2015  . Hypertension 08/21/2015    Past Surgical History:  Procedure Laterality Date  . BRAIN SURGERY  2017   Subdural Hematoma  . NO PAST SURGERIES      Family History  Problem Relation Age of Onset  . Heart failure Mother   . Diabetes Mother   . Hypertension Mother   . Hyperlipidemia Mother   . Cancer Father     Brain  . Diabetes Sister   . Heart disease Brother      Heart Attack    Social History   Social History  . Marital status: Married    Spouse name: N/A  . Number of children: N/A  . Years of education: N/A   Occupational History  . Not on file.   Social History Main Topics  . Smoking status: Current Some Day Smoker    Years: 50.00    Types: Pipe, Cigarettes    Start date: 09/04/1966  . Smokeless tobacco: Never Used  . Alcohol use 1.8 oz/week    3 Shots of liquor per week  . Drug use: No  . Sexual activity: Yes    Partners: Female   Other Topics Concern  . 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  amLODipine (NORVASC) 10 MG tablet, Take 1 tablet (10 mg total) by mouth daily., Disp: 90 tablet, Rfl: 3 .  aspirin EC 81 MG tablet, Take 1 tablet (81 mg total) by mouth daily., Disp: 30 tablet, Rfl: 0 .  B Complex Vitamins (  VITAMIN-B COMPLEX) TABS, Take by mouth., Disp: , Rfl:  .  Cholecalciferol (VITAMIN D3) 2000 units capsule, Take by mouth., Disp: , Rfl:  .  DOCOSAHEXAENOIC ACID PO, Take by mouth., Disp: , Rfl:  .  losartan-hydrochlorothiazide (HYZAAR) 100-25 MG tablet, Take 1 tablet by mouth daily., Disp: 90 tablet, Rfl: 3 .  metFORMIN (GLUCOPHAGE) 1000 MG tablet, Take 1 tablet (1,000 mg total) by mouth 2 (two) times daily with a meal., Disp: 180 tablet, Rfl: 3 .  Multiple Vitamins-Minerals (MULTIVITAMIN WITH MINERALS) tablet, Take by mouth., Disp: , Rfl:  .  sildenafil (VIAGRA) 100 MG tablet, Take 1 tablet (100 mg total) by mouth daily as needed for erectile dysfunction., Disp: 8 tablet, Rfl: 0 .  vitamin C (ASCORBIC ACID) 500 MG tablet, Take 500 mg by mouth., Disp: , Rfl:   Allergies  Allergen Reactions  . Simvastatin Other (See Comments)    Muscle aches     ROS  Constitutional: Negative for fever or weight change.  Respiratory:  Negative for cough and shortness of breath.   Cardiovascular: Negative for chest pain or palpitations.  Gastrointestinal: Negative for abdominal pain, no bowel changes.  Musculoskeletal: Negative for gait problem or joint swelling.  Skin: Negative for rash.  Neurological: Negative for dizziness or headache.  No other specific complaints in a complete review of systems (except as listed in HPI above).  Objective  Vitals:   01/06/17 0809  BP: 130/80  Pulse: 88  Resp: 16  Temp: 98.2 F (36.8 C)  SpO2: 98%  Weight: 199 lb 7 oz (90.5 kg)  Height: 5\' 9"  (1.753 m)    Body mass index is 29.45 kg/m.  Physical Exam  Constitutional: Patient appears well-developed and well-nourished.  No distress.  HEENT: head atraumatic, normocephalic, pupils equal and reactive to light, ears mild wax on left side, neck supple, throat within normal limits Cardiovascular: Normal rate, regular rhythm and normal heart sounds.  No murmur heard. No BLE edema. Pulmonary/Chest: Effort normal and breath sounds normal. No respiratory distress. Abdominal: Soft.  There is no tenderness. Psychiatric: Patient has a normal mood and affect. behavior is normal. Judgment and thought content normal.  Recent Results (from the past 2160 hour(s))  POCT HgB A1C     Status: Normal   Collection Time: 01/06/17  8:14 AM  Result Value Ref Range   Hemoglobin A1C 6.8      PHQ2/9: Depression screen [facility] 2/9 01/06/2017 09/04/2016 08/21/2015  Decreased Interest 0 0 0  Down, Depressed, Hopeless 0 0 0  PHQ - 2 Score 0 0 0     Fall Risk: Fall Risk  01/06/2017 09/04/2016 08/21/2015  Falls in the past year? Yes Yes No  Number falls in past yr: 1 1 -  Injury with Fall? Yes Yes -  Follow up Falls evaluation completed - -     Functional Status Survey: Is the patient deaf or have difficulty hearing?: No Does the patient have difficulty seeing, even when wearing glasses/contacts?: No Does the patient have difficulty concentrating,  remembering, or making decisions?: No Does the patient have difficulty walking or climbing stairs?: No Does the patient have difficulty dressing or bathing?: No Does the patient have difficulty doing errands alone such as visiting a doctor's office or shopping?: No    Assessment & Plan  1. Controlled diabetes mellitus with nephropathy (HCC)  - POCT HgB A1C  2. Essential hypertension  Well controlled with medication  3. Dyslipidemia associated with type 2 diabetes mellitus ([place])  He  refuses medication, but has changed diet, we will recheck labs on his next visit  4. History of traumatic head injury  Doing well.

## 2017-04-04 ENCOUNTER — Telehealth: Payer: Self-pay | Admitting: Family Medicine

## 2017-04-09 ENCOUNTER — Other Ambulatory Visit: Payer: Self-pay | Admitting: Family Medicine

## 2017-04-10 ENCOUNTER — Telehealth: Payer: Self-pay

## 2017-04-10 NOTE — Telephone Encounter (Signed)
Patient requesting refill of Losartan/HCTZ to [facility].

## 2017-04-11 MED ORDER — LOSARTAN POTASSIUM-HCTZ 100-25 MG PO TABS: 1.0000 | ORAL_TABLET | Freq: Every day | ORAL | 0 refills | Status: DC

## 2017-04-11 MED ORDER — AMLODIPINE BESYLATE 10 MG PO TABS: 10.0000 mg | ORAL_TABLET | Freq: Every day | ORAL | 0 refills | Status: DC

## 2017-04-11 MED ORDER — METFORMIN HCL 1000 MG PO TABS: 1000.0000 mg | ORAL_TABLET | Freq: Two times a day (BID) | ORAL | 0 refills | Status: DC

## 2017-04-14 NOTE — Telephone Encounter (Signed)
Already have appt scheduled for 05/26/17

## 2017-05-26 ENCOUNTER — Other Ambulatory Visit: Payer: Self-pay

## 2017-05-26 ENCOUNTER — Ambulatory Visit (INDEPENDENT_AMBULATORY_CARE_PROVIDER_SITE_OTHER): Payer: BLUE CROSS/BLUE SHIELD | Admitting: Family Medicine

## 2017-05-26 ENCOUNTER — Encounter: Payer: Self-pay | Admitting: Family Medicine

## 2017-05-26 VITALS — BP 132/68 | HR 70 | Temp 98.4°F | Resp 16 | Ht 69.0 in | Wt 195.3 lb

## 2017-05-26 DIAGNOSIS — E1169 Type 2 diabetes mellitus with other specified complication: Secondary | ICD-10-CM

## 2017-05-26 DIAGNOSIS — F17200 Nicotine dependence, unspecified, uncomplicated: Secondary | ICD-10-CM | POA: Diagnosis not present

## 2017-05-26 DIAGNOSIS — I10 Essential (primary) hypertension: Secondary | ICD-10-CM

## 2017-05-26 DIAGNOSIS — N529 Male erectile dysfunction, unspecified: Secondary | ICD-10-CM

## 2017-05-26 DIAGNOSIS — E1121 Type 2 diabetes mellitus with diabetic nephropathy: Secondary | ICD-10-CM | POA: Diagnosis not present

## 2017-05-26 DIAGNOSIS — Z23 Encounter for immunization: Secondary | ICD-10-CM

## 2017-05-26 DIAGNOSIS — Z87828 Personal history of other (healed) physical injury and trauma: Secondary | ICD-10-CM | POA: Diagnosis not present

## 2017-05-26 DIAGNOSIS — R351 Nocturia: Secondary | ICD-10-CM

## 2017-05-26 DIAGNOSIS — E669 Obesity, unspecified: Secondary | ICD-10-CM | POA: Diagnosis not present

## 2017-05-26 DIAGNOSIS — N401 Enlarged prostate with lower urinary tract symptoms: Secondary | ICD-10-CM

## 2017-05-26 DIAGNOSIS — E785 Hyperlipidemia, unspecified: Secondary | ICD-10-CM

## 2017-05-26 DIAGNOSIS — Z125 Encounter for screening for malignant neoplasm of prostate: Secondary | ICD-10-CM

## 2017-05-26 LAB — POCT GLYCOSYLATED HEMOGLOBIN (HGB A1C): Hemoglobin A1C: 6.3

## 2017-05-26 LAB — POCT UA - MICROALBUMIN: Microalbumin Ur, POC: 50 mg/L

## 2017-05-26 MED ORDER — SILDENAFIL CITRATE 100 MG PO TABS: 100.0000 mg | ORAL_TABLET | Freq: Every day | ORAL | 5 refills | Status: DC | PRN

## 2017-05-26 MED ORDER — METFORMIN HCL 1000 MG PO TABS: 1000.0000 mg | ORAL_TABLET | Freq: Every day | ORAL | 1 refills | Status: DC

## 2017-05-26 MED ORDER — LOSARTAN POTASSIUM-HCTZ 100-25 MG PO TABS: 1.0000 | ORAL_TABLET | Freq: Every day | ORAL | 1 refills | Status: DC

## 2017-05-26 MED ORDER — TAMSULOSIN HCL 0.4 MG PO CAPS: 0.4000 mg | ORAL_CAPSULE | Freq: Every day | ORAL | 1 refills | Status: DC

## 2017-05-26 MED ORDER — AMLODIPINE BESYLATE 10 MG PO TABS: 10.0000 mg | ORAL_TABLET | Freq: Every day | ORAL | 1 refills | Status: DC

## 2017-05-26 NOTE — Patient Instructions (Signed)

## 2017-05-26 NOTE — Progress Notes (Signed)
Name: Daniel Fuentes   MRN: [id number]    DOB: [birth year]   Date:05/26/2017       Progress Note  Subjective  Chief Complaint  Chief Complaint  Patient presents with  . Medication Refill    4 month F/U, would like his prescribes for 3 month quantities  . Diabetes    Wife keeps check of his BS and told him his sugar has been good  . Hypertension    Denies any symptoms  . Erectile Dysfunction    Would like to try [place], since he is able to get the 100 mg for $5 a pill at this pharmacy.     HPI  DMII: he has microalbuminuria, ED  and also dyslipidemia, can't tolerate statin therapy and wants to hold off on starting Zetia. He is on ARB. He denies polyphagia but has polydipsia or polyuria. He has nocturia ( seen by Urologist in the past , he wants to try medication now.  He is not checking glucose at home. He is due for an eye exam.  He has lost another 4 lbs since last visit. He is no longer obese, overweight now.  IPSS Questionnaire (AUA-7): Over the past month.   1)  How often have you had a sensation of not emptying your bladder completely after you finish urinating?  0 - Not at all  2)  How often have you had to urinate again less than two hours after you finished urinating? 1 - Less than 1 time in 5  3)  How often have you found you stopped and started again several times when you urinated?  0 - Not at all  4) How difficult have you found it to postpone urination?  4 - More than half the time  5) How often have you had a weak urinary stream?  1 - Less than 1 time in 5  6) How often have you had to push or strain to begin urination?  0 - Not at all  7) How many times did you most typically get up to urinate from the time you went to bed until the time you got up in the morning?  5 - 5+ times  Total score:  0-7 mildly symptomatic   8-19 moderately symptomatic   20-35 severely symptomatic      HTN: taking bp medication,bp is at goal,  denies chest pain or palpitation. He is  not taking aspirin daily   History of sub-dural hematoma: hit his head on the car door frame in August 2017, developed slurred speech a few days later went to [facility] and was found to have a subdural hematoma that had to be drained. He had physical therapy ( one session ) to improve his balance and he has been doing well since. He states he is 100% back to normal now. However his speech seems slurred.   Smoking: he is smoking a few cigarette a few times a week, explained importance of quitting.  Discussed low dose CT scan and he would like to have it done   Patient Active Problem List   Diagnosis Date Noted  . History of traumatic head injury 04/30/2016  . Smoker 03/23/2016  . Allergic rhinitis 03/21/2016  . Controlled diabetes mellitus with nephropathy ([place]) 08/21/2015  . Hypertension 08/21/2015    Past Surgical History:  Procedure Laterality Date  . BRAIN SURGERY  2017   Subdural Hematoma    Family History  Problem Relation Age of Onset  . Heart  failure Mother   . Diabetes Mother   . Hypertension Mother   . Hyperlipidemia Mother   . Cancer Father        Brain  . Diabetes Sister   . Heart disease Brother        Heart Attack    Social History   Social History  . Marital status: Married    Spouse name: N/A  . Number of children: N/A  . Years of education: N/A   Occupational History  . Not on file.   Social History Main Topics  . Smoking status: Current Some Day Smoker    Years: 50.00    Types: Pipe, Cigarettes    Start date: 09/04/1966  . Smokeless tobacco: Never Used     Comment: smoking a few ciagrettes a day now   . Alcohol use 1.8 oz/week    3 Shots of liquor per week  . Drug use: No  . Sexual activity: Yes    Partners: Female   Other Topics Concern  . 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  amLODipine (NORVASC) 10 MG tablet, Take 1 tablet (10 mg total) by mouth daily., Disp: 90 tablet, Rfl: 1 .  Cholecalciferol (VITAMIN D3) 2000 units capsule, Take by mouth., Disp: , Rfl:  .  DOCOSAHEXAENOIC ACID PO, Take by mouth., Disp: , Rfl:  .  losartan-hydrochlorothiazide (HYZAAR) 100-25 MG tablet, Take 1 tablet by mouth daily., Disp: 90 tablet, Rfl: 1 .  metFORMIN (GLUCOPHAGE) 1000 MG tablet, Take 1 tablet (1,000 mg total) by mouth daily with breakfast., Disp: 90 tablet, Rfl: 1 .  Multiple Vitamins-Minerals (MULTIVITAMIN WITH MINERALS) tablet, Take by mouth., Disp: , Rfl:  .  sildenafil (VIAGRA) 100 MG tablet, Take 1 tablet (100 mg total) by mouth daily as needed for erectile dysfunction., Disp: 10 tablet, Rfl: 5 .  vitamin C (ASCORBIC ACID) 500 MG tablet, Take 500 mg by mouth., Disp: , Rfl:  .  aspirin EC 81 MG tablet, Take 1 tablet (81 mg total) by mouth daily. (Patient not taking: Reported on 05/26/2017), Disp: 30 tablet, Rfl: 0 .  B Complex Vitamins (VITAMIN-B COMPLEX) TABS, Take by mouth., Disp: , Rfl:   Allergies  Allergen Reactions  . Simvastatin Other (See Comments)    Muscle aches     ROS  Constitutional: Negative for fever or significant weight change.  Respiratory: Negative for cough and shortness of breath.   Cardiovascular: Negative for chest pain or palpitations.  Gastrointestinal: Negative for abdominal pain, no bowel changes.  Musculoskeletal: Negative for gait problem or joint swelling.  Skin: Negative for rash.  Neurological: Negative for dizziness or headache.  No other specific complaints in a complete review of systems (except as listed in HPI above).  Objective  Vitals:   05/26/17 1422  BP: 132/68  Pulse: 70  Resp: 16  Temp: 98.4 F (36.9 C)  TempSrc: Oral  SpO2: 99%  Weight: 195 lb 4.8 oz (88.6 kg)  Height: 5\' 9"  (1.753 m)    Body mass index is 28.84 kg/m.  Physical Exam  Constitutional: Patient appears  well-developed and well-nourished. Overweight.  No distress.  HEENT: head atraumatic, normocephalic, pupils equal and reactive to light,neck supple, throat within normal limits Cardiovascular: Normal rate,  regular rhythm and normal heart sounds.  No murmur heard. No BLE edema. Pulmonary/Chest: Effort normal and breath sounds normal. No respiratory distress. Abdominal: Soft.  There is no tenderness. Psychiatric: Patient has a normal mood and affect. behavior is normal. Judgment and thought content normal.   Recent Results (from the past 2160 hour(s))  POCT UA - Microalbumin     Status: None   Collection Time: 05/26/17  2:27 PM  Result Value Ref Range   Microalbumin Ur, POC 50 mg/L   Creatinine, POC  mg/dL   Albumin/Creatinine Ratio, Urine, POC    POCT HgB A1C     Status: None   Collection Time: 05/26/17  2:28 PM  Result Value Ref Range   Hemoglobin A1C 6.3      PHQ2/9: Depression screen [facility] 2/9 01/06/2017 09/04/2016 08/21/2015  Decreased Interest 0 0 0  Down, Depressed, Hopeless 0 0 0  PHQ - 2 Score 0 0 0     Fall Risk: Fall Risk  05/26/2017 01/06/2017 09/04/2016 08/21/2015  Falls in the past year? No Yes Yes No  Number falls in past yr: - 1 1 -  Injury with Fall? - Yes Yes -  Follow up - Falls evaluation completed - -     Functional Status Survey: Is the patient deaf or have difficulty hearing?: No Does the patient have difficulty seeing, even when wearing glasses/contacts?: No Does the patient have difficulty concentrating, remembering, or making decisions?: No Does the patient have difficulty walking or climbing stairs?: No Does the patient have difficulty dressing or bathing?: No Does the patient have difficulty doing errands alone such as visiting a doctor's office or shopping?: No   Assessment & Plan  1. Controlled diabetes mellitus with nephropathy (HCC)  - POCT HgB A1C - POCT UA - Microalbumin - metFORMIN (GLUCOPHAGE) 1000 MG tablet; Take 1 tablet (1,000 mg  total) by mouth daily with breakfast.  Dispense: 90 tablet; Refill: 1  2. Essential hypertension  - amLODipine (NORVASC) 10 MG tablet; Take 1 tablet (10 mg total) by mouth daily.  Dispense: 90 tablet; Refill: 1 - losartan-hydrochlorothiazide (HYZAAR) 100-25 MG tablet; Take 1 tablet by mouth daily.  Dispense: 90 tablet; Refill: 1 - COMPLETE METABOLIC PANEL WITH GFR  3. Dyslipidemia associated with type 2 diabetes mellitus (HCC)  - Lipid panel  4. History of traumatic head injury  Still has some slurred speech   5. Needs flu shot  Refused flu shot  6. Obesity, Class I, BMI 30-34.9  Discussed with the patient the risk posed by an increased BMI. Discussed importance of portion control, calorie counting and at least 150 minutes of physical activity weekly. Avoid sweet beverages and drink more water. Eat at least 6 servings of fruit and vegetables daily   7. Prostate cancer screening  - PSA  8. ED (erectile dysfunction) of organic origin  - sildenafil (VIAGRA) 100 MG tablet; Take 1 tablet (100 mg total) by mouth daily as needed for erectile dysfunction.  Dispense: 10 tablet; Refill: 5  9. Smoker  - CT CHEST LUNG CA SCREEN LOW DOSE W/O CM; Future  10. Benign prostatic hyperplasia with nocturia  - tamsulosin (FLOMAX) 0.4 MG CAPS capsule; Take 1 capsule (0.4 mg total) by mouth daily.  Dispense: 90 capsule; Refill: 1

## 2017-05-27 ENCOUNTER — Encounter: Payer: Self-pay | Admitting: Family Medicine

## 2017-05-27 ENCOUNTER — Ambulatory Visit (INDEPENDENT_AMBULATORY_CARE_PROVIDER_SITE_OTHER): Payer: BLUE CROSS/BLUE SHIELD | Admitting: Family Medicine

## 2017-05-27 VITALS — BP 118/82 | HR 94 | Temp 98.8°F | Resp 16 | Ht 68.0 in | Wt 193.2 lb

## 2017-05-27 DIAGNOSIS — Z1211 Encounter for screening for malignant neoplasm of colon: Secondary | ICD-10-CM

## 2017-05-27 DIAGNOSIS — H6123 Impacted cerumen, bilateral: Secondary | ICD-10-CM

## 2017-05-27 DIAGNOSIS — I10 Essential (primary) hypertension: Secondary | ICD-10-CM | POA: Diagnosis not present

## 2017-05-27 DIAGNOSIS — E785 Hyperlipidemia, unspecified: Secondary | ICD-10-CM | POA: Diagnosis not present

## 2017-05-27 DIAGNOSIS — Z125 Encounter for screening for malignant neoplasm of prostate: Secondary | ICD-10-CM | POA: Diagnosis not present

## 2017-05-27 DIAGNOSIS — F17200 Nicotine dependence, unspecified, uncomplicated: Secondary | ICD-10-CM

## 2017-05-27 DIAGNOSIS — Z1212 Encounter for screening for malignant neoplasm of rectum: Secondary | ICD-10-CM

## 2017-05-27 DIAGNOSIS — Z0000 Encounter for general adult medical examination without abnormal findings: Secondary | ICD-10-CM

## 2017-05-27 DIAGNOSIS — Z136 Encounter for screening for cardiovascular disorders: Secondary | ICD-10-CM

## 2017-05-27 DIAGNOSIS — E1169 Type 2 diabetes mellitus with other specified complication: Secondary | ICD-10-CM | POA: Diagnosis not present

## 2017-05-27 LAB — COMPLETE METABOLIC PANEL WITH GFR
ALBUMIN: 4.3 g/dL (ref 3.6–5.1)
ALK PHOS: 64 U/L (ref 40–115)
ALT: 17 U/L (ref 9–46)
AST: 49 U/L — AB (ref 10–35)
BILIRUBIN TOTAL: 0.8 mg/dL (ref 0.2–1.2)
BUN: 14 mg/dL (ref 7–25)
CO2: 24 mmol/L (ref 20–32)
Calcium: 8.8 mg/dL (ref 8.6–10.3)
Chloride: 110 mmol/L (ref 98–110)
Creat: 1.14 mg/dL (ref 0.70–1.25)
GFR, Est African American: 76 mL/min (ref >=60)
GFR, Est Non African American: 66 mL/min (ref >=60)
GLUCOSE: 150 mg/dL — AB (ref 65–99)
Potassium: 3.9 mmol/L (ref 3.5–5.3)
SODIUM: 143 mmol/L (ref 135–146)
TOTAL PROTEIN: 6.5 g/dL (ref 6.1–8.1)

## 2017-05-27 LAB — LIPID PANEL
Cholesterol: 187 mg/dL (ref <200)
HDL: 40 mg/dL — ABNORMAL LOW (ref >40)
LDL Cholesterol: 125 mg/dL — ABNORMAL HIGH (ref <100)
Total CHOL/HDL Ratio: 4.7 Ratio (ref <5.0)
Triglycerides: 112 mg/dL (ref <150)
VLDL: 22 mg/dL (ref <30)

## 2017-05-27 NOTE — Patient Instructions (Signed)
Preventive Care 68 Years and Older, Male Preventive care refers to lifestyle choices and visits with your health care provider that can promote health and wellness. What does preventive care include?  A yearly physical exam. This is also called an annual well check.  Dental exams once or twice a year.  Routine eye exams. Ask your health care provider how often you should have your eyes checked.  Personal lifestyle choices, including: ? Daily care of your teeth and gums. ? Regular physical activity. ? Eating a healthy diet. ? Avoiding tobacco and drug use. ? Limiting alcohol use. ? Practicing safe sex. ? Taking low doses of aspirin every day. ? Taking vitamin and mineral supplements as recommended by your health care provider. What happens during an annual well check? The services and screenings done by your health care provider during your annual well check will depend on your age, overall health, lifestyle risk factors, and family history of disease. Counseling Your health care provider may ask you questions about your:  Alcohol use.  Tobacco use.  Drug use.  Emotional well-being.  Home and relationship well-being.  Sexual activity.  Eating habits.  History of falls.  Memory and ability to understand (cognition).  Work and work environment.  Screening You may have the following tests or measurements:  Height, weight, and BMI.  Blood pressure.  Lipid and cholesterol levels. These may be checked every 5 years, or more frequently if you are over 50 years old.  Skin check.  Lung cancer screening. You may have this screening every year starting at age 55 if you have a 30-pack-year history of smoking and currently smoke or have quit within the past 15 years.  Fecal occult blood test (FOBT) of the stool. You may have this test every year starting at age 50.  Flexible sigmoidoscopy or colonoscopy. You may have a sigmoidoscopy every 5 years or a colonoscopy every 10  years starting at age 50.  Prostate cancer screening. Recommendations will vary depending on your family history and other risks.  Hepatitis C blood test.  Hepatitis B blood test.  Sexually transmitted disease (STD) testing.  Diabetes screening. This is done by checking your blood sugar (glucose) after you have not eaten for a while (fasting). You may have this done every 1-3 years.  Abdominal aortic aneurysm (AAA) screening. You may need this if you are a current or former smoker.  Osteoporosis. You may be screened starting at age 70 if you are at high risk.  Talk with your health care provider about your test results, treatment options, and if necessary, the need for more tests. Vaccines Your health care provider may recommend certain vaccines, such as:  Influenza vaccine. This is recommended every year.  Tetanus, diphtheria, and acellular pertussis (Tdap, Td) vaccine. You may need a Td booster every 10 years.  Varicella vaccine. You may need this if you have not been vaccinated.  Zoster vaccine. You may need this after age 60.  Measles, mumps, and rubella (MMR) vaccine. You may need at least one dose of MMR if you were born in 1957 or later. You may also need a second dose.  Pneumococcal 13-valent conjugate (PCV13) vaccine. One dose is recommended after age 65.  Pneumococcal polysaccharide (PPSV23) vaccine. One dose is recommended after age 65.  Meningococcal vaccine. You may need this if you have certain conditions.  Hepatitis A vaccine. You may need this if you have certain conditions or if you travel or work in places where you   may be exposed to hepatitis A.  Hepatitis B vaccine. You may need this if you have certain conditions or if you travel or work in places where you may be exposed to hepatitis B.  Haemophilus influenzae type b (Hib) vaccine. You may need this if you have certain risk factors.  Talk to your health care provider about which screenings and vaccines  you need and how often you need them. This information is not intended to replace advice given to you by your health care provider. Make sure you discuss any questions you have with your health care provider. Document Released: 10/13/2015 Document Revised: 06/05/2016 Document Reviewed: 07/18/2015 Elsevier Interactive Patient Education  2017 [employer].

## 2017-05-27 NOTE — Progress Notes (Signed)
Patient: Daniel Fuentes, Male    DOB: [birth year], 66 y.o.   MRN: [id number]  Visit Date: 05/27/2017  Today's Provider: Loistine Chance, MD   Chief Complaint  Patient presents with  . Annual Exam    Subjective:   Daniel Fuentes is a 66 y.o. male who presents today for his Subsequent Annual Wellness Visit.  Patient/Caregiver input:  Patient provides history himself, no family present.  HPI  Ear pressure - patient requests to have ears flushed because they have been feeling full; has had cerumen impaction in the past.  Review of Systems Constitutional: Negative for fever or weight change.  Respiratory: Negative for cough and shortness of breath.   Cardiovascular: Negative for chest pain or palpitations.  Gastrointestinal: Negative for abdominal pain, no bowel changes.  Musculoskeletal: Negative for gait problem or joint swelling.  Skin: Negative for rash or concerning lesions.   Neurological: Negative for dizziness or headache.  No other specific complaints in a complete review of systems (except as listed in HPI above).  Past Medical History:  Diagnosis Date  . Diabetes mellitus without complication ([place])   . Hyperlipidemia   . Hypertension     Past Surgical History:  Procedure Laterality Date  . BRAIN SURGERY  2017   Subdural Hematoma    Family History  Problem Relation Age of Onset  . Heart failure Mother   . Diabetes Mother   . Hypertension Mother   . Hyperlipidemia Mother   . Cancer Father        Brain  . Diabetes Sister   . Heart disease Brother        Heart Attack    Social History   Social History  . Marital status: Married    Spouse name: N/A  . Number of children: N/A  . Years of education: N/A   Occupational History  . lawyer    Social History Main Topics  . Smoking status: Current Some Day Smoker    Packs/day: 0.25    Years: 51.00    Types: Pipe, Cigarettes    Start date: 09/04/1966  . Smokeless tobacco: Never Used     Comment: smoking a  few ciagrettes a day now   . Alcohol use 3.0 oz/week    5 Shots of liquor per week  . Drug use: No  . Sexual activity: Yes    Partners: Female   Other Topics Concern  . 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 amLODipine (NORVASC) 10 MG tablet Take 1 tablet (10 mg total) by mouth daily.   . B Complex Vitamins (VITAMIN-B COMPLEX) TABS Take by mouth. 09/04/2016: Received from: [facility]: Take 1 tablet by mouth daily.  . Cholecalciferol (VITAMIN D3) 2000 units capsule Take by mouth. 09/04/2016: Received from: [facility]: Take by mouth.  . DOCOSAHEXAENOIC ACID PO Take by mouth. 09/04/2016: Received from: [facility]: Take 1 g by mouth daily.  [place] losartan-hydrochlorothiazide (HYZAAR) 100-25 MG tablet Take 1 tablet by mouth daily.   . metFORMIN (GLUCOPHAGE) 1000 MG tablet Take 1 tablet (1,000 mg total) by mouth daily with breakfast.   . Multiple Vitamins-Minerals (MULTIVITAMIN WITH MINERALS) tablet Take by  mouth. 09/04/2016: Received from: [facility]: Take 1 tablet by mouth daily.  . sildenafil (VIAGRA) 100 MG tablet Take 1 tablet (100 mg total) by mouth daily as needed for erectile dysfunction.   . tamsulosin (FLOMAX) 0.4 MG CAPS capsule Take 1 capsule (0.4 mg total) by mouth daily.   . vitamin C (ASCORBIC ACID) 500 MG tablet Take 500 mg by mouth. 09/04/2016: Received from: [facility]: Take 500 mg by mouth daily.  . [DISCONTINUED] aspirin EC 81 MG tablet Take 1 tablet (81 mg total) by mouth daily. (Patient not taking: Reported on 05/27/2017)    No facility-administered encounter medications on file as of 05/27/2017.     Allergies  Allergen Reactions  . Simvastatin Other (See Comments)     Muscle aches    Care Team Updated in EHR: Yes - Only sees Dr. Ancil Boozer  Last Vision Exam: Needs exam - plans to schedule soon. Wears corrective lenses: Yes - glasses only Last Dental Exam: Sees the dentist twice a year; wears partial dentures Last Hearing Exam: Has never had; Wears Hearing Aids: No  Functional Ability / Safety Screening 1.  Was the timed Get Up and Go test longer than 30 seconds?  no 2.  Does the patient need help with the phone, transportation, shopping,      preparing meals, housework, laundry, medications, or managing money?  no 3.  Does the patient's home have:  loose throw rugs in the hallway?   no      Grab bars in the bathroom? yes      Handrails on the stairs?   yes      Poor lighting?   no 4.  Has the patient noticed any hearing difficulties?   no  Diet Recall and Exercise Regimen: Gardens mostly, does not do any other regular outdoor activities or exercise. Wife does most of the cooking - mostly organic, fries some foods but only when he asks for fried things.   Fall Risk: No recent falls. See screening under Objective Information  Depression Screen: Mother died last weekend - has not gone back to work yet, but plans to go back in a day or two. See screening under Objective Information.  Advanced Directives: A voluntary discussion about advance care planning including the explanation and discussion of advance directives was extensively discussed with the patient. Explanation about the health care proxy and Living will was reviewed. During this discussion, the patient was able to identify a health care proxy as Cathy A. Freda Munro and plans to fill out the paperwork required.  Does patient have a HCPOA?    no If yes, name and contact information:  Does patient have a living will or MOST form?  no  Cancer Screenings:  Skin: No new or concerning lesions on the skin Lung: Low Dose CT Chest recommended if Age 14-80 years, 30 pack-year currently smoking OR have quit  w/in 15years. Patient does qualify.  Lifestyle risk factor issued reviewed: Diet, exercise, weight management, advised patient smoking is not healthy, nutrition/diet.  This was ordered for patient yesterday Prostate: No prostate cancer family history or personal history; pt believes he has an enlarged prostate - PSA ordered yesterday and pt was prescribed flomax Colon: Due now for re-screening - we will order referral to GI today. He was placed on a Q3 year plan in 2014 by GI.  Additional Screenings:  Hepatitis B/HIV/Syphillis: Declines Hepatitis C Screening: Has had performed, negative in 2015 AAA Screen: Men  age 44 to 66 years if ever smoked recommended to get a one time AAA ultrasound screening exam. Patient does qualify.  He will call his insurance to inquire about coverage, and we will order today  Objective:   Vitals: BP 118/82 (BP Location: Right Arm, Patient Position: Sitting, Cuff Size: Large)   Pulse 94   Temp 98.8 F (37.1 C) (Oral)   Resp 16   Ht 5\' 8"  (1.727 m)   Wt 193 lb 3.2 oz (87.6 kg)   SpO2 95%   BMI 29.38 kg/m  Body mass index is 29.38 kg/m.  Lab Results  Component Value Date   CHOL 200 03/21/2016   CHOL 231 (H) 08/21/2015   Lab Results  Component Value Date   HDL 45 03/21/2016   HDL 52 08/21/2015   Lab Results  Component Value Date   LDLCALC 135 (H) 03/21/2016   LDLCALC 156 (H) 08/21/2015   Lab Results  Component Value Date   TRIG 102 03/21/2016   TRIG 117 08/21/2015   Lab Results  Component Value Date   CHOLHDL 4.4 03/21/2016   CHOLHDL 4.4 08/21/2015   No results found for: LDLDIRECT  No exam data present  Physical Exam Constitutional: Patient appears well-developed and well-nourished. No distress.  HENT: Head: Normocephalic and atraumatic. Ears: B TMs ok, no erythema or effusion; Nose: Nose normal. Mouth/Throat: Oropharynx is clear and moist. No oropharyngeal exudate.  Eyes: Conjunctivae and EOM are normal. Pupils are equal, round, and  reactive to light. No scleral icterus.  Neck: Normal range of motion. Neck supple. No JVD present. No thyromegaly present.  Cardiovascular: Normal rate, regular rhythm and normal heart sounds.  No murmur heard. No BLE edema. Pulmonary/Chest: Effort normal and breath sounds normal. No respiratory distress. Abdominal: Soft. Bowel sounds are normal, no distension. There is no tenderness. no masses MALE GENITALIA: Normal descended testes bilaterally, no masses palpated, no hernias, no lesions, no discharge RECTAL: Prostate symmetrically enlarge (hx BPH)  - no nodules palpated, no tenderness or bogginess. No rectal masses or hemorrhoids.  Musculoskeletal: Normal range of motion, no joint effusions. No gross deformities Neurological: he is alert and oriented to person, place, and time. No cranial nerve deficit. Coordination, balance, strength, speech and gait are normal.  Skin: Skin is warm and dry. No rash noted. No erythema.  Psychiatric: Patient has a normal mood and affect. behavior is normal. Judgment and thought content normal.  Cognitive Testing - 6-CIT  Correct? Score   What year is it? yes 0 Yes = 0    No = 4  What month is it? yes 0 Yes = 0    No = 3  Remember:     Pia Mau, [place], [place]     What time is it? yes 0 Yes = 0    No = 3  Count backwards from 20 to 1 yes 0 Correct = 0    1 error = 2   More than 1 error = 4  Say the months of the year in reverse. yes 0 Correct = 0    1 error = 2   More than 1 error = 4  What address did I ask you to remember? yes 0 Correct = 0  1 error = 2    2 error = 4    3 error = 6    4 error = 8    All wrong = 10       TOTAL SCORE  0/28   Interpretation:  Normal  Normal (0-7) Abnormal (8-28)    Fall Risk: Fall Risk  05/26/2017 01/06/2017 09/04/2016 08/21/2015  Falls in the past year? No Yes Yes No  Number falls in past yr: - 1 1 -  Injury with Fall? - Yes Yes -  Follow up - Falls evaluation completed - -    Depression Screen Depression  screen [facility] 2/9 01/06/2017 09/04/2016 08/21/2015  Decreased Interest 0 0 0  Down, Depressed, Hopeless 0 0 0  PHQ - 2 Score 0 0 0    Recent Results (from the past 2160 hour(s))  POCT UA - Microalbumin     Status: None   Collection Time: 05/26/17  2:27 PM  Result Value Ref Range   Microalbumin Ur, POC 50 mg/L   Creatinine, POC  mg/dL   Albumin/Creatinine Ratio, Urine, POC    POCT HgB A1C     Status: None   Collection Time: 05/26/17  2:28 PM  Result Value Ref Range   Hemoglobin A1C 6.3    Assessment & Plan:     1. Medicare annual wellness visit, subsequent Stable - goal is to increase exercise over the next year - will try to walk more.  2. Screening for colorectal cancer - Ambulatory referral to Gastroenterology  3. Smoker - Not ready to quit yet - US ABDOMINAL AORTA SCREENING AAA; Future  4. Screening for AAA (abdominal aortic aneurysm) - US ABDOMINAL AORTA SCREENING AAA; Future - Recommend patient take 81mg  ASA daily, but he has stopped and does not want to start taking again.  5. Bilateral impacted cerumen - Ear Lavage  Exercise Activities and Dietary recommendations Goals    . Increase physical activity          Goal is to increase exercise to walk 20-30 minutes every other day.     - Patient is not ready to quit smoking at this time. - Discussed health benefits of physical activity, and encouraged him to engage in regular exercise appropriate for his age and condition.   Immunization History  Administered Date(s) Administered  . Influenza, High Dose Seasonal PF 08/21/2015, 09/04/2016  . Pneumococcal Conjugate-13 08/04/2014  . Pneumococcal Polysaccharide-23 08/06/2012, 09/04/2016  . Tdap 12/07/2012    Health Maintenance  Topic Date Due  . OPHTHALMOLOGY EXAM  03/02/1959  . COLONOSCOPY  04/01/2016  . INFLUENZA VACCINE  04/30/2017  . FOOT EXAM  09/04/2017  . HEMOGLOBIN A1C  11/26/2017  . TETANUS/TDAP  12/08/2022  . Hepatitis C Screening  Completed  . PNA  vac Low Risk Adult  Completed    No orders of the defined types were placed in this encounter.   Current Outpatient Prescriptions:  .  amLODipine (NORVASC) 10 MG tablet, Take 1 tablet (10 mg total) by mouth daily., Disp: 90 tablet, Rfl: 1 .  B Complex Vitamins (VITAMIN-B COMPLEX) TABS, Take by mouth., Disp: , Rfl:  .  Cholecalciferol (VITAMIN D3) 2000 units capsule, Take by mouth., Disp: , Rfl:  .  DOCOSAHEXAENOIC ACID PO, Take by mouth., Disp: , Rfl:  .  losartan-hydrochlorothiazide (HYZAAR) 100-25 MG tablet, Take 1 tablet by mouth daily., Disp: 90 tablet, Rfl: 1 .  metFORMIN (GLUCOPHAGE) 1000 MG tablet, Take 1 tablet (1,000 mg total) by mouth daily with breakfast., Disp: 90 tablet, Rfl: 1 .  Multiple Vitamins-Minerals (MULTIVITAMIN WITH MINERALS) tablet, Take by mouth., Disp: , Rfl:  .  sildenafil (VIAGRA) 100 MG tablet, Take 1 tablet (100 mg total) by mouth daily as needed for  erectile dysfunction., Disp: 10 tablet, Rfl: 5 .  tamsulosin (FLOMAX) 0.4 MG CAPS capsule, Take 1 capsule (0.4 mg total) by mouth daily., Disp: 90 capsule, Rfl: 1 .  vitamin C (ASCORBIC ACID) 500 MG tablet, Take 500 mg by mouth., Disp: , Rfl:  Medications Discontinued During This Encounter  Medication Reason  . aspirin EC 81 MG tablet Patient Discharge    I have personally reviewed and addressed the Medicare Annual Wellness health risk assessment questionnaire and have noted the following in the patient's chart:  A.         Medical and social history & family history B.         Use of alcohol, tobacco or illicit drugs  C.         Current medications and supplements D.         Functional and Cognitive ability and status E.         Nutritional status F.         Physical activity G.        Advance directives H.         List of other physicians I.          Hospitalizations, surgeries, and ER visits in previous 12 months J.         [place] such as hearing and vision if needed, cognitive and  depression L.         Referrals and appointments - none  In addition, I have reviewed and discussed with patient certain preventive protocols, quality metrics, and best practice recommendations. A written personalized care plan for preventive services as well as general preventive health recommendations were provided to patient.  Return in about 1 year (around 05/27/2018) for [employer].

## 2017-05-28 LAB — PSA: PSA: 3.5 ng/mL (ref <=4.0)

## 2017-06-05 ENCOUNTER — Telehealth: Payer: Self-pay

## 2017-06-05 ENCOUNTER — Telehealth: Payer: Self-pay | Admitting: *Deleted

## 2017-06-05 DIAGNOSIS — Z122 Encounter for screening for malignant neoplasm of respiratory organs: Secondary | ICD-10-CM

## 2017-06-05 DIAGNOSIS — Z87891 Personal history of nicotine dependence: Secondary | ICD-10-CM

## 2017-06-05 NOTE — Telephone Encounter (Signed)
Received referral for low dose lung cancer screening CT scan. Message left at phone number listed in EMR for patient to call me back to facilitate scheduling scan.  

## 2017-06-05 NOTE — Telephone Encounter (Signed)
Patient was informed via voicemail that he has been scheduled to have his [place] on Tuesday, September 11, 208 @ 8:00am at the [facility]. Patient was instructed to arrive 15 mins early and to give [place] a call if he had any questions.

## 2017-06-05 NOTE — Telephone Encounter (Signed)
Received referral for initial lung cancer screening scan. Contacted patient and obtained smoking history,(current, 37.5 pack year) as well as answering questions related to screening process. Patient denies signs of lung cancer such as weight loss or hemoptysis. Patient denies comorbidity that would prevent curative treatment if lung cancer were found. Patient is scheduled for shared decision making visit and CT scan on 06/11/17.

## 2017-06-10 ENCOUNTER — Other Ambulatory Visit: Payer: Self-pay | Admitting: Family Medicine

## 2017-06-10 ENCOUNTER — Ambulatory Visit
Admission: RE | Admit: 2017-06-10 | Discharge: 2017-06-10 | Disposition: A | Discharge status: Home / Self Care | Payer: BLUE CROSS/BLUE SHIELD | Source: Ambulatory Visit | Attending: Family Medicine | Admitting: Family Medicine

## 2017-06-10 DIAGNOSIS — I714 Abdominal aortic aneurysm, without rupture, unspecified: Secondary | ICD-10-CM

## 2017-06-10 DIAGNOSIS — F17200 Nicotine dependence, unspecified, uncomplicated: Secondary | ICD-10-CM | POA: Diagnosis not present

## 2017-06-10 DIAGNOSIS — Z136 Encounter for screening for cardiovascular disorders: Secondary | ICD-10-CM | POA: Diagnosis not present

## 2017-06-10 DIAGNOSIS — I513 Intracardiac thrombosis, not elsewhere classified: Secondary | ICD-10-CM | POA: Insufficient documentation

## 2017-06-10 DIAGNOSIS — Z87891 Personal history of nicotine dependence: Secondary | ICD-10-CM | POA: Diagnosis not present

## 2017-06-10 MED ORDER — ASPIRIN EC 81 MG PO TBEC: 81.0000 mg | DELAYED_RELEASE_TABLET | Freq: Every day | ORAL | 0 refills | Status: AC

## 2017-06-10 MED ORDER — PITAVASTATIN CALCIUM 2 MG PO TABS: 2.0000 mg | ORAL_TABLET | Freq: Every day | ORAL | 1 refills | Status: DC

## 2017-06-11 ENCOUNTER — Encounter: Payer: Self-pay | Admitting: Nurse Practitioner

## 2017-06-11 ENCOUNTER — Inpatient Hospital Stay: Payer: BLUE CROSS/BLUE SHIELD | Attending: Nurse Practitioner | Admitting: Nurse Practitioner

## 2017-06-11 ENCOUNTER — Ambulatory Visit
Admission: RE | Admit: 2017-06-11 | Discharge: 2017-06-11 | Disposition: A | Discharge status: Home / Self Care | Payer: BLUE CROSS/BLUE SHIELD | Source: Ambulatory Visit | Attending: Nurse Practitioner | Admitting: Nurse Practitioner

## 2017-06-11 DIAGNOSIS — F17210 Nicotine dependence, cigarettes, uncomplicated: Secondary | ICD-10-CM | POA: Diagnosis not present

## 2017-06-11 DIAGNOSIS — Z87891 Personal history of nicotine dependence: Secondary | ICD-10-CM

## 2017-06-11 DIAGNOSIS — J439 Emphysema, unspecified: Secondary | ICD-10-CM | POA: Insufficient documentation

## 2017-06-11 DIAGNOSIS — I2510 Atherosclerotic heart disease of native coronary artery without angina pectoris: Secondary | ICD-10-CM | POA: Insufficient documentation

## 2017-06-11 DIAGNOSIS — Z122 Encounter for screening for malignant neoplasm of respiratory organs: Secondary | ICD-10-CM

## 2017-06-12 DIAGNOSIS — Z87891 Personal history of nicotine dependence: Secondary | ICD-10-CM | POA: Insufficient documentation

## 2017-06-12 NOTE — Progress Notes (Signed)
In accordance with CMS guidelines, patient has met eligibility criteria including age, absence of signs or symptoms of lung cancer.  Social History  Substance Use Topics  . Smoking status: Current Some Day Smoker    Packs/day: 0.75    Years: 50.00    Types: Pipe, Cigarettes    Start date: 09/04/1966  . Smokeless tobacco: Never Used     Comment: smoking a few ciagrettes a day now   . Alcohol use 3.0 oz/week    5 Shots of liquor per week     A shared decision-making session was conducted prior to the performance of CT scan. This includes one or more decision aids, includes benefits and harms of screening, follow-up diagnostic testing, over-diagnosis, false positive rate, and total radiation exposure.  Counseling on the importance of adherence to annual lung cancer LDCT screening, impact of co-morbidities, and ability or willingness to undergo diagnosis and treatment is imperative for compliance of the program.  Counseling on the importance of continued smoking cessation for former smokers; the importance of smoking cessation for current smokers, and information about tobacco cessation interventions have been given to patient including [place] and 1800 quit [place] programs.  Written order for lung cancer screening with LDCT has been given to the patient and any and all questions have been answered to the best of my abilities.   Yearly follow up will be coordinated by Burgess Estelle, Thoracic Navigator.  Beckey Rutter, NP 06/12/17 8:49 AM

## 2017-06-13 ENCOUNTER — Encounter: Payer: Self-pay | Admitting: *Deleted

## 2017-06-13 ENCOUNTER — Encounter: Payer: Self-pay | Admitting: Family Medicine

## 2017-06-13 DIAGNOSIS — I2584 Coronary atherosclerosis due to calcified coronary lesion: Secondary | ICD-10-CM

## 2017-06-13 DIAGNOSIS — I2510 Atherosclerotic heart disease of native coronary artery without angina pectoris: Secondary | ICD-10-CM | POA: Insufficient documentation

## 2017-06-13 DIAGNOSIS — J439 Emphysema, unspecified: Secondary | ICD-10-CM | POA: Insufficient documentation

## 2017-06-16 ENCOUNTER — Telehealth: Payer: Self-pay | Admitting: Gastroenterology

## 2017-06-16 ENCOUNTER — Ambulatory Visit: Payer: BLUE CROSS/BLUE SHIELD

## 2017-06-16 NOTE — Telephone Encounter (Signed)
Patient left voice message that we call him to schedule.

## 2017-06-17 NOTE — Telephone Encounter (Signed)
Returned patient's call to schedule colonoscopy.   Unable to leave voicemail. Box full.

## 2017-06-20 ENCOUNTER — Encounter: Payer: Self-pay | Admitting: Family Medicine

## 2017-06-20 ENCOUNTER — Ambulatory Visit (INDEPENDENT_AMBULATORY_CARE_PROVIDER_SITE_OTHER): Payer: BLUE CROSS/BLUE SHIELD | Admitting: Family Medicine

## 2017-06-20 VITALS — BP 112/62 | HR 106 | Temp 98.1°F | Resp 18 | Ht 69.0 in | Wt 199.0 lb

## 2017-06-20 DIAGNOSIS — Z23 Encounter for immunization: Secondary | ICD-10-CM

## 2017-06-20 DIAGNOSIS — I2510 Atherosclerotic heart disease of native coronary artery without angina pectoris: Secondary | ICD-10-CM

## 2017-06-20 DIAGNOSIS — I2584 Coronary atherosclerosis due to calcified coronary lesion: Secondary | ICD-10-CM | POA: Diagnosis not present

## 2017-06-20 DIAGNOSIS — J438 Other emphysema: Secondary | ICD-10-CM | POA: Diagnosis not present

## 2017-06-20 NOTE — Progress Notes (Signed)
Name: Daniel Fuentes   MRN: [id number]    DOB: June 25, 1951   Date:06/20/2017       Progress Note  Subjective  Chief Complaint  Chief Complaint  Patient presents with  . Results    CT Scan    HPI  Mr. Rajan went for a CT lung for lung cancer screening and results were positive for emphysema, but he also was found to have coronary artery calcification. He has occasional cough, a few times a week, no wheezing or SOB, he also denies decrease in exercise tolerance ( goes up a flight of stairs daily) without problems, and never experienced chest pain. We discussed results and he agrees in having a spirometry and we will refer him to cardiologist for further evaluation  Patient Active Problem List   Diagnosis Date Noted  . Coronary artery calcification of native artery 06/13/2017  . Emphysema lung ([place]) 06/13/2017  . Personal history of tobacco use, presenting hazards to health 06/12/2017  . Aneurysm, abdominal aortic ([place]) 06/10/2017  . History of traumatic head injury 04/30/2016  . Smoker 03/23/2016  . Allergic rhinitis 03/21/2016  . Controlled diabetes mellitus with nephropathy ([place]) 08/21/2015  . Hypertension 08/21/2015    Past Surgical History:  Procedure Laterality Date  . BRAIN SURGERY  2017   Subdural Hematoma    Family History  Problem Relation Age of Onset  . Heart failure Mother   . Diabetes Mother   . Hypertension Mother   . Hyperlipidemia Mother   . Cancer Father        Brain  . Diabetes Sister   . Heart disease Brother        Heart Attack    Social History   Social History  . Marital status: Married    Spouse name: N/A  . Number of children: N/A  . Years of education: N/A   Occupational History  . lawyer    Social History Main Topics  . Smoking status: Current Some Day Smoker    Packs/day: 0.75    Years: 50.00    Types: Pipe, Cigarettes    Start date: 09/04/1966  . Smokeless tobacco: Never Used     Comment: smoking a few ciagrettes a day now   .  Alcohol use 3.0 oz/week    5 Shots of liquor per week  . Drug use: No  . Sexual activity: Yes    Partners: Female   Other Topics Concern  . 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  amLODipine (NORVASC) 10 MG tablet, Take 1 tablet (10 mg total) by mouth daily., Disp: 90 tablet, Rfl: 1 .  aspirin EC 81 MG tablet, Take 1 tablet (81 mg total) by mouth daily., Disp: 30 tablet, Rfl: 0 .  B Complex Vitamins (VITAMIN-B COMPLEX) TABS, Take by mouth., Disp: , Rfl:  .  Cholecalciferol (VITAMIN D3) 2000 units capsule, Take by mouth., Disp: , Rfl:  .  DOCOSAHEXAENOIC ACID PO, Take by mouth., Disp: , Rfl:  .  losartan-hydrochlorothiazide (HYZAAR) 100-25 MG tablet, Take 1 tablet by mouth daily., Disp: 90 tablet, Rfl: 1 .  metFORMIN (GLUCOPHAGE) 1000 MG tablet, Take 1 tablet (1,000 mg total) by mouth daily with breakfast., Disp: 90 tablet, Rfl: 1 .  Multiple Vitamins-Minerals (  MULTIVITAMIN WITH MINERALS) tablet, Take by mouth., Disp: , Rfl:  .  Pitavastatin Calcium (LIVALO) 2 MG TABS, Take 1 tablet (2 mg total) by mouth daily., Disp: 90 tablet, Rfl: 1 .  sildenafil (VIAGRA) 100 MG tablet, Take 1 tablet (100 mg total) by mouth daily as needed for erectile dysfunction., Disp: 10 tablet, Rfl: 5 .  tamsulosin (FLOMAX) 0.4 MG CAPS capsule, Take 1 capsule (0.4 mg total) by mouth daily., Disp: 90 capsule, Rfl: 1 .  vitamin C (ASCORBIC ACID) 500 MG tablet, Take 500 mg by mouth., Disp: , Rfl:   Allergies  Allergen Reactions  . Simvastatin Other (See Comments)    Muscle aches     ROS  Constitutional: Negative for fever or weight change.  Respiratory: Positive  for  Intermittent cough ( at most a few times a week)  Denies  shortness of breath.   Cardiovascular: Negative for chest pain or palpitations.  Gastrointestinal:  Negative for abdominal pain, no bowel changes.  Musculoskeletal: Negative for gait problem or joint swelling.  Skin: Negative for rash.  Neurological: Negative for dizziness or headache.  No other specific complaints in a complete review of systems (except as listed in HPI above).   Objective  Vitals:   06/20/17 1450  BP: 112/62  Pulse: (!) 106  Resp: 18  Temp: 98.1 F (36.7 C)  TempSrc: Oral  SpO2: 98%  Weight: 199 lb (90.3 kg)  Height: 5\' 9"  (1.753 m)    Body mass index is 29.39 kg/m.  Physical Exam  Constitutional: Patient appears well-developed and well-nourished. No distress.  HEENT: head atraumatic, normocephalic, pupils equal and reactive to light,  neck supple, throat within normal limits Cardiovascular: Normal rate, regular rhythm and normal heart sounds.  No murmur heard. No BLE edema. Pulmonary/Chest: Effort normal and breath sounds normal. No respiratory distress. Abdominal: Soft.  There is no tenderness. Psychiatric: Patient has a normal mood and affect. behavior is normal. Judgment and thought content normal.  Recent Results (from the past 2160 hour(s))  POCT UA - Microalbumin     Status: None   Collection Time: 05/26/17  2:27 PM  Result Value Ref Range   Microalbumin Ur, POC 50 mg/L   Creatinine, POC  mg/dL   Albumin/Creatinine Ratio, Urine, POC    POCT HgB A1C     Status: None   Collection Time: 05/26/17  2:28 PM  Result Value Ref Range   Hemoglobin A1C 6.3   COMPLETE METABOLIC PANEL WITH GFR     Status: Abnormal   Collection Time: 05/27/17  8:19 AM  Result Value Ref Range   Sodium 143 135 - 146 mmol/L   Potassium 3.9 3.5 - 5.3 mmol/L   Chloride 110 98 - 110 mmol/L   CO2 24 20 - 32 mmol/L    Comment: ** Please note change in reference range(s). **      Glucose, Bld 150 (H) 65 - 99 mg/dL   BUN 14 7 - 25 mg/dL   Creat 1.14 0.70 - 1.25 mg/dL    Comment:   For patients > or = 68 years of age: The upper reference limit for Creatinine is  approximately 13% higher for people identified as African-American.      Total Bilirubin 0.8 0.2 - 1.2 mg/dL   Alkaline Phosphatase 64 40 - 115 U/L   AST 49 (H) 10 - 35 U/L   ALT 17 9 - 46 U/L   Total Protein 6.5 6.1 - 8.1 g/dL   Albumin 4.3  3.6 - 5.1 g/dL   Calcium 8.8 8.6 - 10.3 mg/dL   GFR, Est African American 76 >=60 mL/min   GFR, Est Non African American 66 >=60 mL/min  Lipid panel     Status: Abnormal   Collection Time: 05/27/17  8:19 AM  Result Value Ref Range   Cholesterol 187 <200 mg/dL   Triglycerides 112 <150 mg/dL   HDL 40 (L) >40 mg/dL   Total CHOL/HDL Ratio 4.7 <5.0 Ratio   VLDL 22 <30 mg/dL   LDL Cholesterol 125 (H) <100 mg/dL  PSA     Status: None   Collection Time: 05/27/17  8:19 AM  Result Value Ref Range   PSA 3.5 <=4.0 ng/mL    Comment:   The total PSA value from this assay system is standardized against the WHO standard. The test result will be approximately 20% lower when compared to the equimolar-standardized total PSA (Beckman Coulter). Comparison of serial PSA results should be interpreted with this fact in mind.   This test was performed using the Siemens chemiluminescent method. Values obtained from different assay methods cannot be used interchangeably. PSA levels, regardless of value, should not be interpreted as absolute evidence of the presence or absence of disease.         PHQ2/9: Depression screen [facility] 2/9 01/06/2017 09/04/2016 08/21/2015  Decreased Interest 0 0 0  Down, Depressed, Hopeless 0 0 0  PHQ - 2 Score 0 0 0     Fall Risk: Fall Risk  05/26/2017 01/06/2017 09/04/2016 08/21/2015  Falls in the past year? No Yes Yes No  Number falls in past yr: - 1 1 -  Injury with Fall? - Yes Yes -  Follow up - Falls evaluation completed - -     Assessment & Plan  1. Other emphysema ([place])  - Spirometry: Pre & Post Eval - normal and patient has very mild symptoms we decided not to start medication but explained importance of quitting  smoking  2. Needs flu shot  - Flu vaccine HIGH DOSE PF  3. Calcification of coronary artery  Explained importance of taking aspirin and statin therapy daily to decrease risk of cardiovascular death and also refer to cardiologist.  He prefers not starting statin therapy at this time because of myalgia caused by simvastatin  -referral cardiologist

## 2017-06-23 ENCOUNTER — Telehealth: Payer: Self-pay | Admitting: Gastroenterology

## 2017-06-23 NOTE — Telephone Encounter (Signed)
Patient called and ready to schedule a colonoscopy.

## 2017-07-02 ENCOUNTER — Telehealth: Payer: Self-pay | Admitting: Family Medicine

## 2017-07-02 NOTE — Telephone Encounter (Signed)
Documentation revewied

## 2017-07-02 NOTE — Telephone Encounter (Signed)
Patient stated he did not get any cholesterol medication. Was having joint pain from medication. Will try to it on his own. HIs wife is a Marine scientist and she did not recommend for him to take it.

## 2017-07-02 NOTE — Telephone Encounter (Signed)
Spoke to [place], Statin was denied.

## 2017-07-02 NOTE — Telephone Encounter (Signed)
I received a denial of a prior Auth for this patient for [place]. In the denial, it states that he may be approved if he has had a side effect to statin therapy in the past.  I thought that I had submitted documentation that he had significant muscle aches with Simvastatin in the past.  Could you please contact [employer] to gather additional information/dispute this denial?  Thanks!

## 2017-07-07 ENCOUNTER — Encounter: Payer: BLUE CROSS/BLUE SHIELD | Admitting: Family Medicine

## 2017-09-08 ENCOUNTER — Ambulatory Visit: Payer: BLUE CROSS/BLUE SHIELD | Admitting: Cardiovascular Disease

## 2017-09-10 ENCOUNTER — Encounter: Payer: Self-pay | Admitting: Internal Medicine

## 2017-09-10 ENCOUNTER — Ambulatory Visit (INDEPENDENT_AMBULATORY_CARE_PROVIDER_SITE_OTHER): Payer: BLUE CROSS/BLUE SHIELD | Admitting: Internal Medicine

## 2017-09-10 VITALS — BP 124/74 | HR 65 | Ht 69.0 in | Wt 201.2 lb

## 2017-09-10 DIAGNOSIS — I2510 Atherosclerotic heart disease of native coronary artery without angina pectoris: Secondary | ICD-10-CM

## 2017-09-10 DIAGNOSIS — I517 Cardiomegaly: Secondary | ICD-10-CM | POA: Diagnosis not present

## 2017-09-10 DIAGNOSIS — I2584 Coronary atherosclerosis due to calcified coronary lesion: Secondary | ICD-10-CM | POA: Diagnosis not present

## 2017-09-10 DIAGNOSIS — E782 Mixed hyperlipidemia: Secondary | ICD-10-CM | POA: Diagnosis not present

## 2017-09-10 DIAGNOSIS — I313 Pericardial effusion (noninflammatory): Secondary | ICD-10-CM

## 2017-09-10 DIAGNOSIS — I10 Essential (primary) hypertension: Secondary | ICD-10-CM

## 2017-09-10 DIAGNOSIS — I3139 Other pericardial effusion (noninflammatory): Secondary | ICD-10-CM

## 2017-09-10 NOTE — Patient Instructions (Addendum)
Medication Instructions:  Your physician recommends that you continue on your current medications as directed. Please refer to the Current Medication list given to you today.   Labwork: none  Testing/Procedures: Your physician has requested that you have an echocardiogram. Echocardiography is a painless test that uses sound waves to create images of your heart. It provides your doctor with information about the size and shape of your heart and how well your heart's chambers and valves are working. This procedure takes approximately one hour. There are no restrictions for this procedure.    [place]  Your caregiver has ordered a Stress Test with nuclear imaging. The purpose of this test is to evaluate the blood supply to your heart muscle. This procedure is referred to as a "Non-Invasive Stress Test." This is because other than having an IV started in your vein, nothing is inserted or "invades" your body. Cardiac stress tests are done to find areas of poor blood flow to the heart by determining the extent of coronary artery disease (CAD). Some patients exercise on a treadmill, which naturally increases the blood flow to your heart, while others who are  unable to walk on a treadmill due to physical limitations have a pharmacologic/chemical stress agent called Lexiscan . This medicine will mimic walking on a treadmill by temporarily increasing your coronary blood flow.   Please note: these test may take anywhere between 2-4 hours to complete  PLEASE REPORT TO [facility] AT THE FIRST DESK WILL DIRECT YOU WHERE TO GO  Date of Procedure:___12/18/18_________  Arrival Time for Procedure:_______08:45 am_________  Instructions regarding medication:   _XX_ : Hold diabetes medication (METFORMIN) morning of procedure    PLEASE NOTIFY THE OFFICE AT LEAST 24 HOURS IN ADVANCE IF YOU ARE UNABLE TO KEEP YOUR APPOINTMENT.  [phone] AND  PLEASE NOTIFY  NUCLEAR MEDICINE AT [facility] AT LEAST 24 HOURS IN ADVANCE IF YOU ARE UNABLE TO KEEP YOUR APPOINTMENT. [phone]  How to prepare for your Myoview test:  1. Do not eat or drink after midnight 2. No caffeine for 24 hours prior to test 3. No smoking 24 hours prior to test. 4. Your medication may be taken with water.  If your doctor stopped a medication because of this test, do not take that medication. 5. Ladies, please do not wear dresses.  Skirts or pants are appropriate. Please wear a short sleeve shirt. 6. No perfume, cologne or lotion. 7. Wear comfortable walking shoes. No heels!    Follow-Up: Your physician recommends that you schedule a follow-up appointment in: AS NEEDED BASIS.    If you need a refill on your cardiac medications before your next appointment, please call your pharmacy.   Echocardiogram An echocardiogram, or echocardiography, uses sound waves (ultrasound) to produce an image of your heart. The echocardiogram is simple, painless, obtained within a short period of time, and offers valuable information to your health care provider. The images from an echocardiogram can provide information such as:  Evidence of coronary artery disease (CAD).  Heart size.  Heart muscle function.  Heart valve function.  Aneurysm detection.  Evidence of a past heart attack.  Fluid buildup around the heart.  Heart muscle thickening.  Assess heart valve function.  Tell a health care provider about:  Any allergies you have.  All medicines you are taking, including vitamins, herbs, eye drops, creams, and over-the-counter medicines.  Any problems you or family members have had with anesthetic medicines.  Any blood disorders  you have.  Any surgeries you have had.  Any medical conditions you have.  Whether you are pregnant or may be pregnant. What happens before the procedure? No special preparation is needed. Eat and drink normally. What happens during the  procedure?  In order to produce an image of your heart, gel will be applied to your chest and a wand-like tool (transducer) will be moved over your chest. The gel will help transmit the sound waves from the transducer. The sound waves will harmlessly bounce off your heart to allow the heart images to be captured in real-time motion. These images will then be recorded.  You may need an IV to receive a medicine that improves the quality of the pictures. What happens after the procedure? You may return to your normal schedule including diet, activities, and medicines, unless your health care provider tells you otherwise. This information is not intended to replace advice given to you by your health care provider. Make sure you discuss any questions you have with your health care provider. Document Released: 09/13/2000 Document Revised: 05/04/2016 Document Reviewed: 05/24/2013 Elsevier Interactive Patient Education  2017 [employer].    Cardiac Nuclear Scan A cardiac nuclear scan is a test that measures blood flow to the heart when a person is resting and when he or she is exercising. The test looks for problems such as:  Not enough blood reaching a portion of the heart.  The heart muscle not working normally.  You may need this test if:  You have heart disease.  You have had abnormal lab results.  You have had heart surgery or angioplasty.  You have chest pain.  You have shortness of breath.  In this test, a radioactive dye (tracer) is injected into your bloodstream. After the tracer has traveled to your heart, an imaging device is used to measure how much of the tracer is absorbed by or distributed to various areas of your heart. This procedure is usually done at a hospital and takes 2-4 hours. Tell a health care provider about:  Any allergies you have.  All medicines you are taking, including vitamins, herbs, eye drops, creams, and over-the-counter medicines.  Any problems you  or family members have had with the use of anesthetic medicines.  Any blood disorders you have.  Any surgeries you have had.  Any medical conditions you have.  Whether you are pregnant or may be pregnant. What are the risks? Generally, this is a safe procedure. However, problems may occur, including:  Serious chest pain and heart attack. This is only a risk if the stress portion of the test is done.  Rapid heartbeat.  Sensation of warmth in your chest. This usually passes quickly.  What happens before the procedure?  Ask your health care provider about changing or stopping your regular medicines. This is especially important if you are taking diabetes medicines or blood thinners.  Remove your jewelry on the day of the procedure. What happens during the procedure?  An IV tube will be inserted into one of your veins.  Your health care provider will inject a small amount of radioactive tracer through the tube.  You will wait for 20-40 minutes while the tracer travels through your bloodstream.  Your heart activity will be monitored with an electrocardiogram (ECG).  You will lie down on an exam table.  Images of your heart will be taken for about 15-20 minutes.  You may be asked to exercise on a treadmill or stationary bike. While  you exercise, your heart's activity will be monitored with an ECG, and your blood pressure will be checked. If you are unable to exercise, you may be given a medicine to increase blood flow to parts of your heart.  When blood flow to your heart has peaked, a tracer will again be injected through the IV tube.  After 20-40 minutes, you will get back on the exam table and have more images taken of your heart.  When the procedure is over, your IV tube will be removed. The procedure may vary among health care providers and hospitals. Depending on the type of tracer used, scans may need to be repeated 3-4 hours later. What happens after the  procedure?  Unless your health care provider tells you otherwise, you may return to your normal schedule, including diet, activities, and medicines.  Unless your health care provider tells you otherwise, you may increase your fluid intake. This will help flush the contrast dye from your body. Drink enough fluid to keep your urine clear or pale yellow.  It is up to you to get your test results. Ask your health care provider, or the department that is doing the test, when your results will be ready. Summary  A cardiac nuclear scan measures the blood flow to the heart when a person is resting and when he or she is exercising.  You may need this test if you are at risk for heart disease.  Tell your health care provider if you are pregnant.  Unless your health care provider tells you otherwise, increase your fluid intake. This will help flush the contrast dye from your body. Drink enough fluid to keep your urine clear or pale yellow. This information is not intended to replace advice given to you by your health care provider. Make sure you discuss any questions you have with your health care provider. Document Released: 10/11/2004 Document Revised: 09/18/2016 Document Reviewed: 08/25/2013 Elsevier Interactive Patient Education  2017 [employer].

## 2017-09-10 NOTE — Progress Notes (Signed)
New Outpatient Visit Date: 09/10/2017  Referring Provider: Steele Sizer, MD [address] [place], [place] [address]  Chief Complaint: Coronary artery calcification  HPI:  Mr. Scott is a 66 y.o. male who is being seen today for the evaluation of incidentally noted coronary artery calcification at the request of Dr. Ancil Boozer. He has a history of hypertension, hyperlipidemia, type 2 diabetes mellitus, and subdural hematoma following head trauma. Mr. Schewe underwent lung cancer screening CT of the chest in September, which revealed coronary artery calcification as well as possible focal pericardial fluid collection versus pericardial thickening. Mr. Walston denies a history of previous cardiac disease and trauma to the chest. He has not experienced any chest pain, shortness of breath, palpitations, lightheadedness, orthopnea, PND, and edema. 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  Mr. Skibinski has been intolerant of several statins due to myalgias. He notes that his cholesterol has improved with drinking "oatmeal smoothies" on a regular basis.  --------------------------------------------------------------------------------------------------  Cardiovascular History & Procedures: Cardiovascular Problems:  Coronary artery calcification  Loculated pericardial effusion versus focal pericardial thickening  Risk Factors:  Hypertension, hyperlipidemia, diabetes mellitus, tobacco use, male gender, and age > 26  Cath/PCI:  None  CV Surgery:  None  EP Procedures and Devices:  None  Non-Invasive Evaluation(s):  CT chest (06/11/17): Scant coronary artery calcification is noted. There is also pericardial thickening versus loculated pericardial effusion anterior to the heart.  Recent CV Pertinent Labs: Lab Results  Component Value Date   CHOL 187 05/27/2017   CHOL 231 (H) 08/21/2015   HDL 40 (L) 05/27/2017   HDL 52 08/21/2015   LDLCALC 125 (H) 05/27/2017   LDLCALC 156 (H) 08/21/2015   TRIG 112 05/27/2017   CHOLHDL 4.7 05/27/2017   INR 0.99 04/29/2016   K 3.9 05/27/2017   BUN 14 05/27/2017   BUN 18 08/21/2015   CREATININE 1.14 05/27/2017    --------------------------------------------------------------------------------------------------  Past Medical History:  Diagnosis Date  . Diabetes mellitus without complication ([employer])   . Hyperlipidemia   . Hypertension     Past Surgical History:  Procedure Laterality Date  . BRAIN SURGERY  2017   Subdural Hematoma    Current Meds  Medication Sig  . amLODipine (NORVASC) 10 MG tablet Take 1 tablet (10 mg total) by mouth daily.  Marland Kitchen aspirin EC 81 MG tablet Take 1 tablet (81 mg total) by mouth daily.  . B Complex Vitamins (VITAMIN-B COMPLEX) TABS Take by mouth.  . Cholecalciferol (VITAMIN D3) 2000 units capsule Take by mouth.  . DOCOSAHEXAENOIC ACID PO Take by mouth.  . losartan-hydrochlorothiazide (HYZAAR) 100-25 MG tablet Take 1 tablet by mouth daily.  . metFORMIN (GLUCOPHAGE) 1000 MG tablet Take 1 tablet (1,000 mg total) by mouth daily with breakfast.  . Multiple Vitamins-Minerals (MULTIVITAMIN WITH MINERALS) tablet Take by mouth.  . Pitavastatin Calcium (LIVALO) 2 MG TABS Take 1 tablet (2 mg total) by mouth daily.  . tamsulosin (FLOMAX) 0.4 MG CAPS capsule Take 1 capsule (0.4 mg total) by mouth daily.  . vitamin C (ASCORBIC ACID) 500 MG tablet Take 500 mg by mouth.    Allergies: Simvastatin  Social History   Socioeconomic History  . Marital status: Married    Spouse name: Not on file  . Number of children: Not on file  . Years of education: Not on file  . Highest education level: Not on file  Social Needs  . Financial resource strain: Not on file  .  Food insecurity - worry: Not on file  . Food insecurity - inability: Not on file  . Transportation needs - medical: Not on file  . Transportation needs - non-medical: Not on file  Occupational History    . Occupation: Chief Executive Officer  Tobacco Use  . Smoking status: Current Some Day Smoker    Packs/day: 0.75    Years: 50.00    Pack years: 37.50    Types: Pipe, Cigarettes    Start date: 09/04/1966  . Smokeless tobacco: Never Used  . Tobacco comment: smoking a few ciagrettes a day now   Substance and Sexual Activity  . Alcohol use: Yes    Alcohol/week: 3.0 oz    Types: 5 Shots of liquor per week  . Drug use: No  . Sexual activity: Yes    Partners: Female  Other Topics Concern  . 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 Heart failure Mother   . Diabetes Mother   . Hypertension Mother   . Hyperlipidemia Mother   . Cancer Father        Brain  . Diabetes Sister   . Heart disease Brother        Heart Attack    Review of Systems: A 12-system review of systems was performed and was negative except as noted in the HPI.  --------------------------------------------------------------------------------------------------  Physical Exam: BP 124/74 (BP Location: Right Arm, Patient Position: Sitting, Cuff Size: Normal)   Pulse 65   Ht 5\' 9"  (1.753 m)   Wt 201 lb 4 oz (91.3 kg)   BMI 29.72 kg/m   General:  Overweight man, seated comfortably in the exam room. HEENT: No conjunctival pallor or scleral icterus. Moist mucous membranes. OP clear. Neck: Supple without lymphadenopathy, thyromegaly, JVD, or HJR. No carotid bruit. Lungs: Normal work of breathing. Clear to auscultation bilaterally without wheezes or crackles. Heart: Regular rate and rhythm without murmurs, rubs, or gallops. Non-displaced PMI. Abd: Bowel sounds present. Soft, NT/ND without hepatosplenomegaly Ext: No lower extremity edema. Radial, PT, and DP pulses are 2+ bilaterally Skin: Warm and dry without rash. Neuro: CNIII-XII intact.  Strength and fine-touch sensation intact in upper and lower extremities bilaterally. Psych: Normal mood and affect.  EKG:  Normal sinus rhythm, left atrial enlargement, LVH, and non-specific ST/T changes.  Lab Results  Component Value Date   WBC 5.8 04/29/2016   HGB 13.8 04/29/2016   HCT 39.1 (L) 04/29/2016   MCV 87.7 04/29/2016   PLT 305 04/29/2016    Lab Results  Component Value Date   NA 143 05/27/2017   K 3.9 05/27/2017   CL 110 05/27/2017   CO2 24 05/27/2017   BUN 14 05/27/2017   CREATININE 1.14 05/27/2017   GLUCOSE 150 (H) 05/27/2017   ALT 17 05/27/2017    Lab Results  Component Value Date   CHOL 187 05/27/2017   HDL 40 (L) 05/27/2017   LDLCALC 125 (H) 05/27/2017   TRIG 112 05/27/2017   CHOLHDL 4.7 05/27/2017    --------------------------------------------------------------------------------------------------  ASSESSMENT AND PLAN: Coronary artery calcification Incidental finding on recent lung cancer screening chest CT. I have personally reviewed the CT scan, which demonstrates minimal coronary artery calcification predominantly involving the RCA. Though he has been asymptomatic, Mr. Uemura  has multiple cardiac risk factors as well as nonspecific EKG abnormalities. We have discussed the risks and benefits of further evaluation for obstructive coronary artery disease and have agreed to proceed with an exercise myocardial perfusion stress test. I recommend that Mr. Gruver continue taking low-dose aspirin. Further recommendations regarding lipid therapy will be made after completion of the stress test.  Pericardial effusion Loculated pericardial effusion versus focal pericardial thickening noted on chest CT. Mr. Ackers has been asymptomatic and denies any history of chest trauma or prior cardiac disease. We will obtain an echocardiogram for further assessment.  Left ventricular hypertrophy EKG demonstrates LVH with left atrial enlargement. We will obtain an  echocardiogram for further assessment.  Hypertension Blood pressure is adequately controlled today. No medication changes at this time.  Hyperlipidemia Given history of diabetes and multiple other cardiac risk factors, Mr. Renteria would benefit from being on at least moderate intensity statin. He notes intolerance to several statins in the past due to myalgias. Ultimately, we will need to readdress this, though we will defer retrial of statin until after completion of stress test.  Follow-up: To be determined based on the results of stress test and echocardiogram.  Nelva Bush, MD 09/10/2017 3:40 PM

## 2017-09-11 ENCOUNTER — Encounter: Payer: Self-pay | Admitting: Internal Medicine

## 2017-09-11 DIAGNOSIS — I313 Pericardial effusion (noninflammatory): Secondary | ICD-10-CM | POA: Insufficient documentation

## 2017-09-11 DIAGNOSIS — E782 Mixed hyperlipidemia: Secondary | ICD-10-CM | POA: Insufficient documentation

## 2017-09-11 DIAGNOSIS — I517 Cardiomegaly: Secondary | ICD-10-CM | POA: Insufficient documentation

## 2017-09-11 DIAGNOSIS — I3139 Other pericardial effusion (noninflammatory): Secondary | ICD-10-CM | POA: Insufficient documentation

## 2017-09-15 ENCOUNTER — Telehealth: Payer: Self-pay | Admitting: Internal Medicine

## 2017-09-15 NOTE — Telephone Encounter (Signed)
Patient needs to cancel myoview for 12/18 Please call to reschedule

## 2017-09-16 ENCOUNTER — Ambulatory Visit: Payer: BLUE CROSS/BLUE SHIELD

## 2017-09-16 NOTE — Telephone Encounter (Signed)
I left a message for the patient to call. 

## 2017-09-18 NOTE — Telephone Encounter (Signed)
Spoke with patient and advised him the test is approx 5,000.00. Gave him cpt code and advised him to call his insurance company with info so they can break down better how much he would be responsible for.  Patient thanked me and stated he would call them

## 2017-09-18 NOTE — Telephone Encounter (Signed)
S/w patient. Exercise Myoview rescheduled for 09/25/17, arrival of 0845 for 0900 procedure. He verbalized understanding of new appointment date and time and to follow instructions as listed on AVS.  He wanted to know how much this will cost him as well. Depending on cost, will depend on if he can have the test or not. Routing to billing for advice.

## 2017-09-25 ENCOUNTER — Ambulatory Visit: Payer: BLUE CROSS/BLUE SHIELD

## 2017-10-01 ENCOUNTER — Other Ambulatory Visit: Payer: BLUE CROSS/BLUE SHIELD

## 2017-11-06 ENCOUNTER — Encounter: Payer: Self-pay | Admitting: Internal Medicine

## 2017-11-11 NOTE — Telephone Encounter (Signed)
Appt resolved

## 2017-12-18 IMAGING — CT CT HEAD W/O CM
3 series · 14 of 47 positions shown, 16 images · non-contrast
Comparison: None.

CLINICAL DATA: Right leg weakness for 1 week. Difficulty with
speech today.

EXAM:
CT HEAD WITHOUT CONTRAST
TECHNIQUE: Contiguous axial images were obtained from the base of the skull
through the vertex without intravenous contrast.

[Series 2: head wo · axial · 0.43mm/px · z∈[+273,+398]mm · 8 of 30 slices shown, 10 images]
[im 3/30  brain]
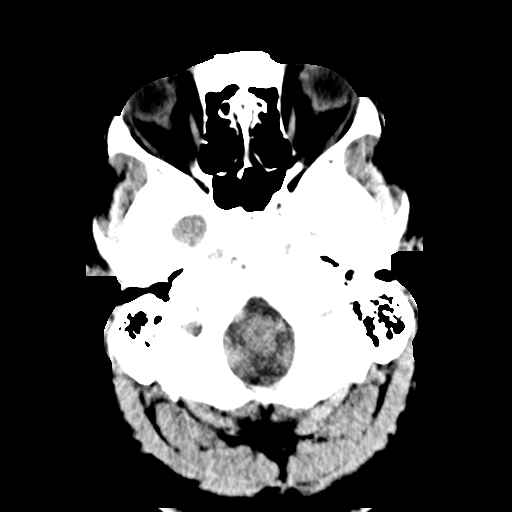
[im 3/30  bone]
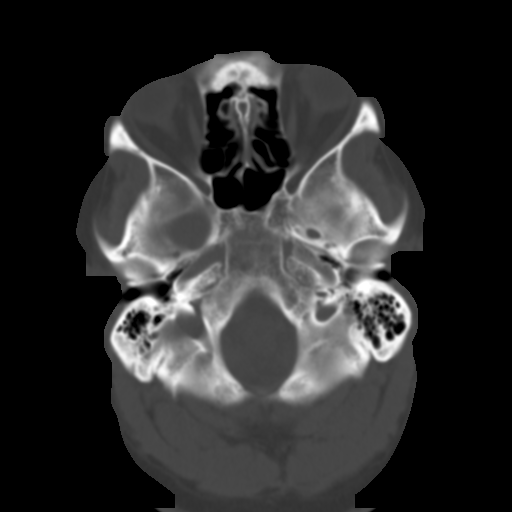
[im 7/30  brain]
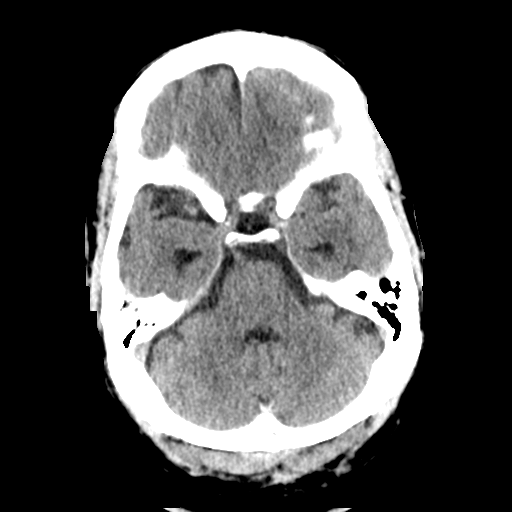
[im 10/30  brain]
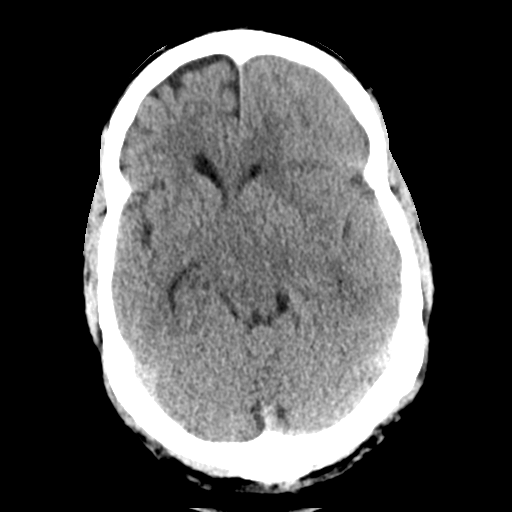
[im 14/30  brain]
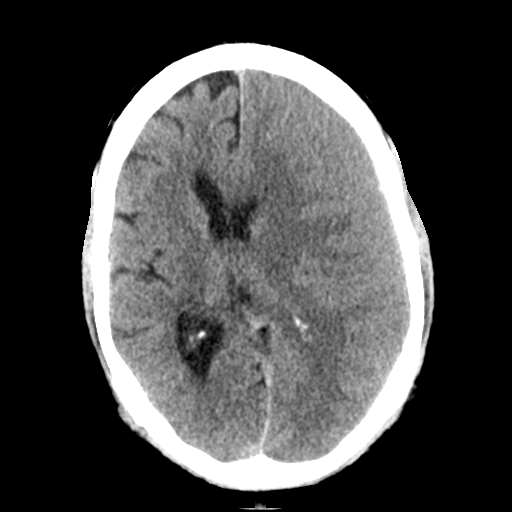
[im 17/30  brain]
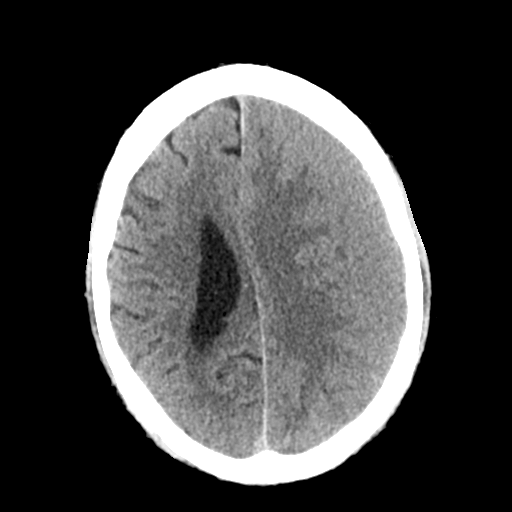
[im 17/30  bone]
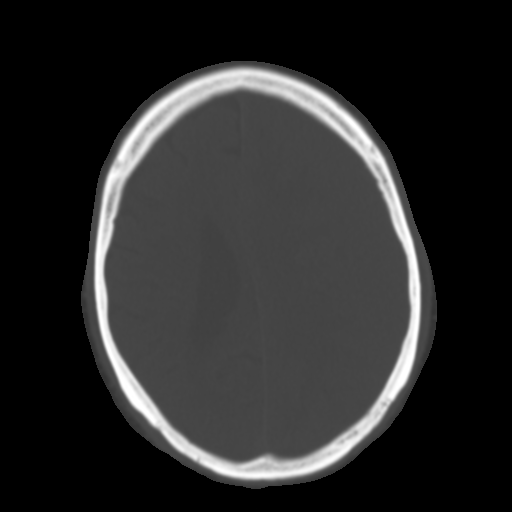
[im 21/30  brain]
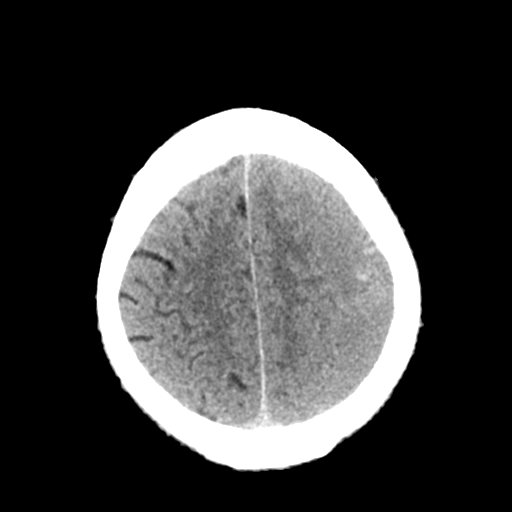
[im 24/30  brain]
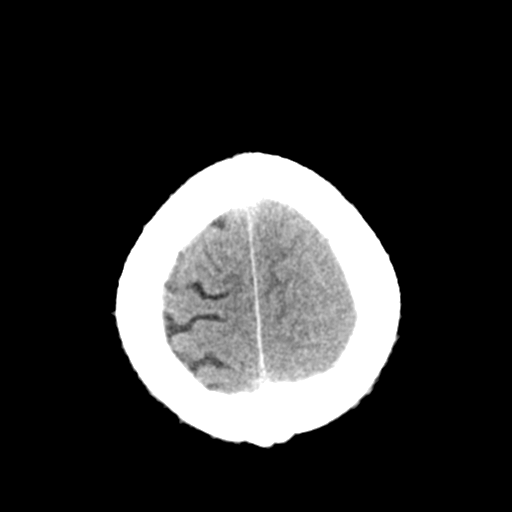
[im 28/30  brain]
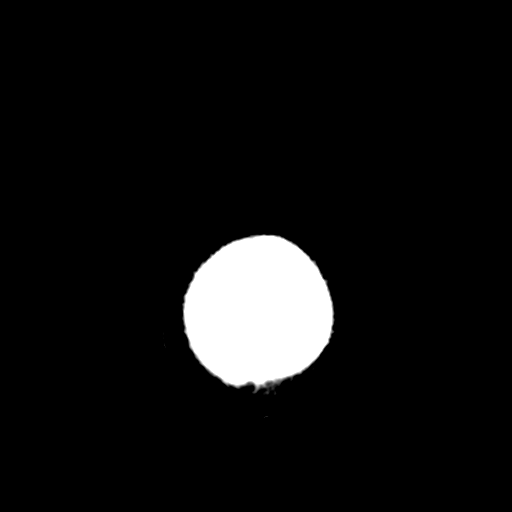

[Series 4: coronal soft tissue · coronal · 0.29mm/px · 3 of 66 slices shown]
[im 22/66  brain]
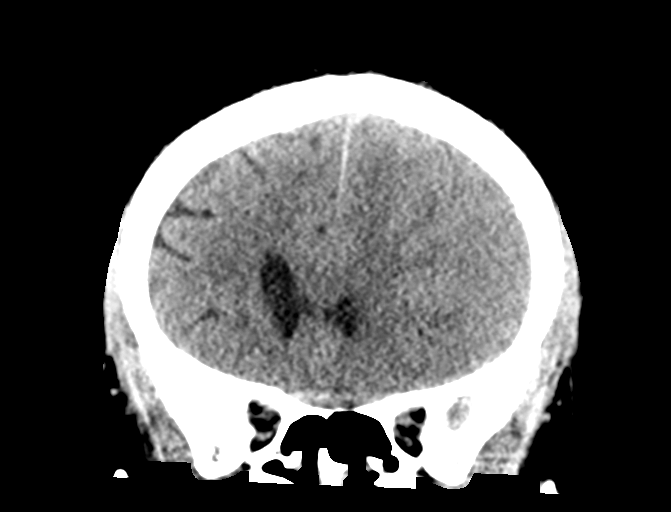
[im 29/66  brain]
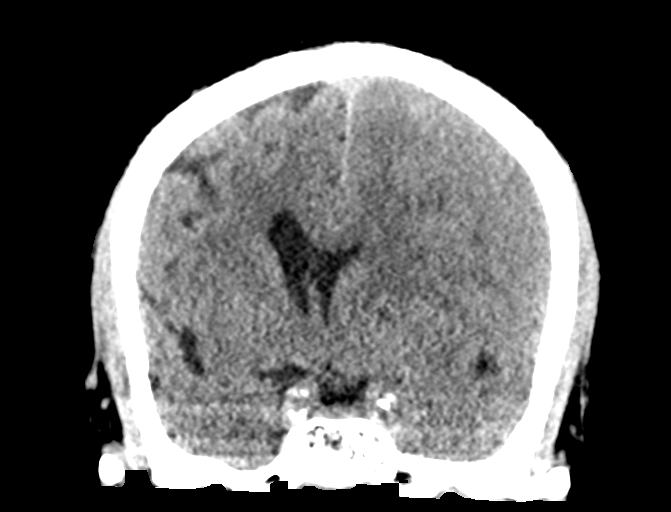
[im 37/66  brain]
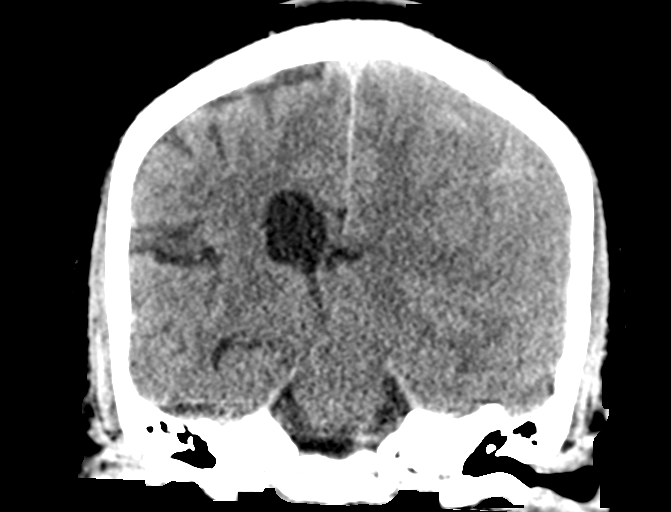

[Series 5: sagittal soft tissue · sagittal · 0.29mm/px · 3 of 53 slices shown]
[im 18/53  brain]
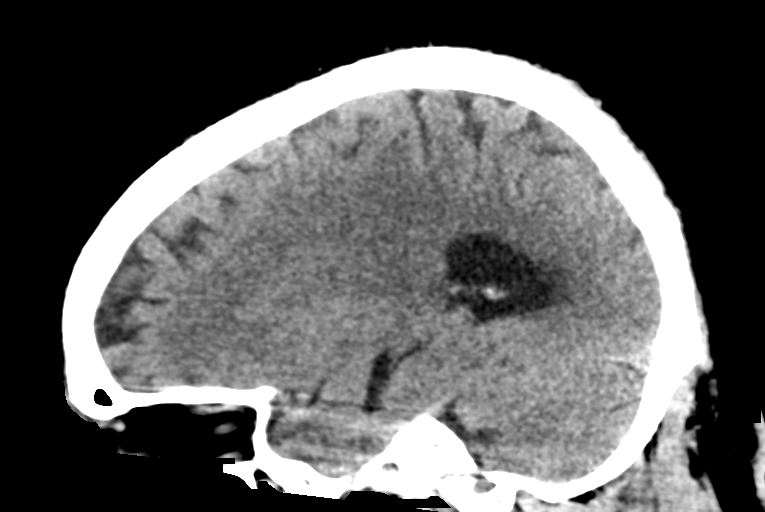
[im 27/53  brain]
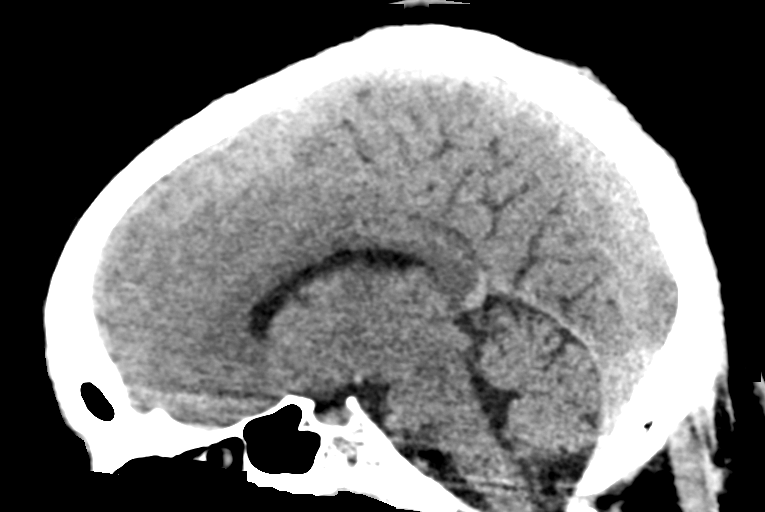
[im 35/53  brain]
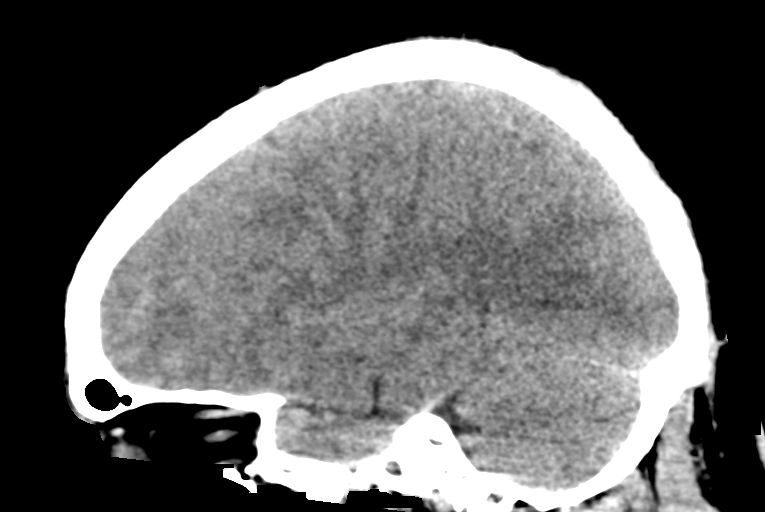

[14 of 47 positions shown; findings below may reference images not displayed]

FINDINGS: The patient has a large left subdural hematoma with both acute and
subacute components. There is left to right midline shift of
approximately 0.9 cm. The subdural collection measures approximately
2.5 cm in thickness. No mass lesion or evidence of acute infarction
is identified. The calvarium is intact. Imaged paranasal sinuses and
mastoid air cells are clear.
IMPRESSION: Large acute on subacute left subdural hemorrhage with associated
cm left right midline shift.

Critical Value/emergent results were called by telephone at the time
of interpretation on 04/29/2016 at [DATE] to Dr. DILEK SILOS ,
who verbally acknowledged these results.

## 2017-12-22 ENCOUNTER — Other Ambulatory Visit: Payer: Self-pay

## 2017-12-22 MED ORDER — HYDROCHLOROTHIAZIDE 25 MG PO TABS: 25.0000 mg | ORAL_TABLET | Freq: Every day | ORAL | 0 refills | Status: DC

## 2017-12-22 MED ORDER — LOSARTAN POTASSIUM 100 MG PO TABS: 100.0000 mg | ORAL_TABLET | Freq: Every day | ORAL | 0 refills | Status: DC

## 2017-12-22 NOTE — Telephone Encounter (Signed)
[facility] stated Losartan-HCTZ is on Backorder. They wanted to know if they could separate the order until the medication becomes in supply again. Patient was notified and aware of change. Patient states please only send in a short time supply until they receive the combination. Patient was also made aware he needs a medication follow up and transferred to [facility] to schedule.

## 2017-12-26 ENCOUNTER — Ambulatory Visit (INDEPENDENT_AMBULATORY_CARE_PROVIDER_SITE_OTHER): Payer: BLUE CROSS/BLUE SHIELD | Admitting: Nurse Practitioner

## 2017-12-26 ENCOUNTER — Encounter: Payer: Self-pay | Admitting: Nurse Practitioner

## 2017-12-26 VITALS — BP 130/70 | HR 91 | Temp 98.0°F | Resp 18 | Ht 69.0 in | Wt 204.9 lb

## 2017-12-26 DIAGNOSIS — E669 Obesity, unspecified: Secondary | ICD-10-CM | POA: Diagnosis not present

## 2017-12-26 DIAGNOSIS — E782 Mixed hyperlipidemia: Secondary | ICD-10-CM

## 2017-12-26 DIAGNOSIS — J438 Other emphysema: Secondary | ICD-10-CM | POA: Diagnosis not present

## 2017-12-26 DIAGNOSIS — I10 Essential (primary) hypertension: Secondary | ICD-10-CM

## 2017-12-26 DIAGNOSIS — I714 Abdominal aortic aneurysm, without rupture, unspecified: Secondary | ICD-10-CM

## 2017-12-26 DIAGNOSIS — K635 Polyp of colon: Secondary | ICD-10-CM | POA: Diagnosis not present

## 2017-12-26 DIAGNOSIS — E1121 Type 2 diabetes mellitus with diabetic nephropathy: Secondary | ICD-10-CM

## 2017-12-26 DIAGNOSIS — Z1211 Encounter for screening for malignant neoplasm of colon: Secondary | ICD-10-CM

## 2017-12-26 DIAGNOSIS — H6122 Impacted cerumen, left ear: Secondary | ICD-10-CM

## 2017-12-26 DIAGNOSIS — Z79899 Other long term (current) drug therapy: Secondary | ICD-10-CM

## 2017-12-26 DIAGNOSIS — N521 Erectile dysfunction due to diseases classified elsewhere: Secondary | ICD-10-CM | POA: Diagnosis not present

## 2017-12-26 MED ORDER — HYDROCHLOROTHIAZIDE 25 MG PO TABS: 25.0000 mg | ORAL_TABLET | Freq: Every day | ORAL | 0 refills | Status: DC

## 2017-12-26 MED ORDER — SILDENAFIL CITRATE 100 MG PO TABS: 100.0000 mg | ORAL_TABLET | Freq: Every day | ORAL | 0 refills | Status: DC | PRN

## 2017-12-26 MED ORDER — METFORMIN HCL 1000 MG PO TABS: 1000.0000 mg | ORAL_TABLET | Freq: Every day | ORAL | 1 refills | Status: DC

## 2017-12-26 MED ORDER — AMLODIPINE BESYLATE 10 MG PO TABS: 10.0000 mg | ORAL_TABLET | Freq: Every day | ORAL | 1 refills | Status: DC

## 2017-12-26 MED ORDER — LOSARTAN POTASSIUM 100 MG PO TABS: 100.0000 mg | ORAL_TABLET | Freq: Every day | ORAL | 0 refills | Status: DC

## 2017-12-26 NOTE — Progress Notes (Addendum)
Name: Daniel Fuentes   MRN: [id number]    DOB: 11-Aug-1951   Date:12/26/2017       Progress Note  Subjective  Chief Complaint  Chief Complaint  Patient presents with  . Hypertension    medication refills  . Diabetes  . Ear Pain    HPI  Hypertension Taking amlodipine 10mg  daily and HCTZ 25 ms daily, losartan 100 mg daily - Taking it every day; no SE. Denies headaches, blurry vision, chest pain, weakness. Does not check BP at home; well-controlled today. States he likes salty things but has cut down, uses low-sodium flavoring sometimes.   DM Patient take metformin daily; no SE. Denies polyphagia, polydipsia. Pt endorses polyuria- sts pees a lot at night - drinks a lot of water, has enlarged prostate, HCTZ   Hyperlipidemia - refusing Statin, states wife is a Programmer, multimedia so he typically eats healthy. Sts had recent death in the family at the end of last year and had been eating more due to sadness.  He ate a roast beef sandwich at noon but cant come back for fasting labs. Exercise: hasn't been exercising but plans to do zumba and join basketball.   Tobacco Use Does a pack a week or less now Has been smoking for 50 years, sts has always done less than one pack a day. Does not want to quit- discussed benefits of smoking cessation   Ear fullness Has been ongoing for years- states MD previously told him he produces a lot of ear wax so he gets them flushed every 6 months. Denies hearing issues, pus or pain  Colon Cancer Screening Patient states he had a colonscopy in 2014 does not recall any findings or reason why he needed another scope in three years. Pt denies blood in stool.   Patient Active Problem List   Diagnosis Date Noted  . Pericardial effusion 09/11/2017  . LVH (left ventricular hypertrophy) 09/11/2017  . Mixed hyperlipidemia 09/11/2017  . Coronary artery calcification of native artery 06/13/2017  . Emphysema lung ([place]) 06/13/2017  . Personal history of tobacco use,  presenting hazards to health 06/12/2017  . Aneurysm, abdominal aortic ([place]) 06/10/2017  . History of traumatic head injury 04/30/2016  . Smoker 03/23/2016  . Allergic rhinitis 03/21/2016  . Controlled diabetes mellitus with nephropathy ([place]) 08/21/2015  . Hypertension 08/21/2015    Social History   Tobacco Use  . Smoking status: Current Some Day Smoker    Packs/day: 0.25    Years: 50.00    Pack years: 12.50    Types: Pipe, Cigarettes    Start date: 09/04/1966  . Smokeless tobacco: Never Used  . Tobacco comment: smoking a few ciagrettes a day now   Substance Use Topics  . Alcohol use: Yes    Alcohol/week: 1.8 oz    Types: 3 Shots of liquor per week     Current Outpatient Medications:  .  amLODipine (NORVASC) 10 MG tablet, Take 1 tablet (10 mg total) by mouth daily., Disp: 90 tablet, Rfl: 1 .  aspirin EC 81 MG tablet, Take 1 tablet (81 mg total) by mouth daily., Disp: 30 tablet, Rfl: 0 .  B Complex Vitamins (VITAMIN-B COMPLEX) TABS, Take by mouth., Disp: , Rfl:  .  Cholecalciferol (VITAMIN D3) 2000 units capsule, Take by mouth., Disp: , Rfl:  .  DOCOSAHEXAENOIC ACID PO, Take by mouth., Disp: , Rfl:  .  hydrochlorothiazide (HYDRODIURIL) 25 MG tablet, Take 1 tablet (25 mg total) by mouth daily., Disp: 30 tablet,  Rfl: 0 .  losartan (COZAAR) 100 MG tablet, Take 1 tablet (100 mg total) by mouth daily., Disp: 30 tablet, Rfl: 0 .  metFORMIN (GLUCOPHAGE) 1000 MG tablet, Take 1 tablet (1,000 mg total) by mouth daily with breakfast., Disp: 90 tablet, Rfl: 1 .  Multiple Vitamins-Minerals (MULTIVITAMIN WITH MINERALS) tablet, Take by mouth., Disp: , Rfl:  .  vitamin C (ASCORBIC ACID) 500 MG tablet, Take 500 mg by mouth., Disp: , Rfl:   Allergies  Allergen Reactions  . Simvastatin Other (See Comments)    Muscle aches    ROS  Constitutional: Negative for fever Positive weight gain.  Respiratory: Negative for cough and shortness of breath.   Cardiovascular: Negative for chest pain or  palpitations.  Gastrointestinal: Negative for abdominal pain, no bowel changes.  Musculoskeletal: Negative for gait problem or joint swelling.  Skin: Negative for rash.  Neurological: Negative for dizziness or headache.  No other specific complaints in a complete review of systems (except as listed in HPI above).  Objective  Vitals:   12/26/17 1419  BP: 130/70  Pulse: 91  Resp: 18  Temp: 98 F (36.7 C)  TempSrc: Oral  SpO2: 96%  Weight: 204 lb 14.4 oz (92.9 kg)  Height: 5\' 9"  (1.753 m)   Body mass index is 30.26 kg/m.  Nursing Note and Vital Signs reviewed.  Physical Exam  Constitutional: Patient appears well-developed and well-nourished. Obese. No distress.  HEENT: head atraumatic, normocephalic, pupils equal and reactive to light, TM's without erythema unable to visualize in left ear intially due to cerumen impaction- after irrigation able to visualize partially, right ear no cerumen- but dry ear canal, no maxillary or frontal sinus tenderness , neck supple without thyromegaly-  No nodules palpated Cardiovascular: Normal rate, regular rhythm, S1/S2 present.  Pulmonary/Chest: Effort normal and breath sounds clear. No respiratory distress or retractions. Abdominal: Soft and non-tender, bowel sounds present  Psychiatric: Patient has a normal mood and affect. behavior is normal. Judgment and thought content normal.  No results found for this or any previous visit (from the past 72 hour(s)).  Assessment & Plan  1.Essential hypertension -Stable continue therapies  - COMPLETE METABOLIC PANEL WITH GFR - amLODipine (NORVASC) 10 MG tablet; Take 1 tablet (10 mg total) by mouth daily.  Dispense: 90 tablet; Refill: 1 - losartan (COZAAR) 100 MG tablet; Take 1 tablet (100 mg total) by mouth daily.  Dispense: 30 tablet; Refill: 0 - metFORMIN (GLUCOPHAGE) 1000 MG tablet; Take 1 tablet (1,000 mg total) by mouth daily with breakfast.  Dispense: 90 tablet; Refill: 1 - hydrochlorothiazide  (HYDRODIURIL) 25 MG tablet; Take 1 tablet (25 mg total) by mouth daily.  Dispense: 30 tablet; Refill: 0  2. Mixed hyperlipidemia - diet, refused statin  - COMPLETE METABOLIC PANEL WITH GFR - Lipid Profile  3. Controlled diabetes mellitus with nephropathy (H[facility]) - diet continue,  Add exercise  - HgB A1c - COMPLETE METABOLIC PANEL WITH GFR - losartan (COZAAR) 100 MG tablet; Take 1 tablet (100 mg total) by mouth daily.  Dispense: 30 tablet; Refill: 0 - metFORMIN (GLUCOPHAGE) 1000 MG tablet; Take 1 tablet (1,000 mg total) by mouth daily with breakfast.  Dispense: 90 tablet; Refill: 1  4. Medication management  - COMPLETE METABOLIC PANEL WITH GFR  5. Erectile dysfunction due to diseases classified elsewhere - sildenafil (VIAGRA) 100 MG tablet; Take 1 tablet (100 mg total) by mouth daily as needed for erectile dysfunction.  Dispense: 10 tablet; Refill: 0  6. Impacted cerumen of left ear -  irrigated, do not use cotton swabs in ear  7. Other emphysema ([place]) - still smoking- cut down but not willing to quit, denies chronic cough, exertional shob, wheezing  8. Abdominal aortic aneurysm (AAA) without rupture ([place]) - [place] completed in 05/2017 noted dilation of distal abdominal aorta diamete 3cm rec follow up US in 3 years- 05/2020 - bp well controlled, continue current therapies - patient refusing statin  -Red flags and when to present for emergency care or RTC including fever >101.42F, chest pain, shortness of breath, new/worsening/un-resolving symptoms, reviewed with patient at time of visit. Follow up and care instructions discussed and provided in AVS. -Reviewed Health Maintenance: states has appointment next month for eye exam,  Referral placed for colonoscopy last one completed in 2014 with 3 year follow-up recommendation due to polyp finding  I have reviewed this encounter including the documentation in this note and/or discussed this patient with the provider, Suezanne Cheshire DNP AGNP-C.  I am certifying that I agree with the content of this note as supervising physician. Steele Sizer, MD [facility] 12/28/2017, 6:38 PM

## 2017-12-26 NOTE — Patient Instructions (Addendum)
- Try taking a baby aspirin that is enteric coated (81mg  EC) with food daily; if it causes stomach upset let us know.  - I would like you to get the following labs drawn: Complete Metabolic Panel, Hemoglobin A1C, Lipid Panel We use [facility] here you are welcome to contact your insurance company to check cost and let me know how I can adjust location or lab company to best serve you  Cholesterol Cholesterol is a fat. Your body needs a small amount of cholesterol. Cholesterol (plaque) may build up in your blood vessels (arteries). That makes you more likely to have a heart attack or stroke. You cannot feel your cholesterol level. Having a blood test is the only way to find out if your level is high. Keep your test results. Work with your doctor to keep your cholesterol at a good level. What do the results mean?  Total cholesterol is how much cholesterol is in your blood.  LDL is bad cholesterol. This is the type that can build up. Try to have low LDL.  HDL is good cholesterol. It cleans your blood vessels and carries LDL away. Try to have high HDL.  Triglycerides are fat that the body can store or burn for energy. What are good levels of cholesterol?  Total cholesterol below 200.  LDL below 100 is good for people who have health risks. LDL below 70 is good for people who have very high risks.  HDL above 40 is good. It is best to have HDL of 60 or higher.  Triglycerides below 150. How can I lower my cholesterol? Diet Follow your diet program as told by your doctor.  Choose fish, white meat chicken, or [place] that is roasted or baked. Try not to eat red meat, fried foods, sausage, or lunch meats.  Eat lots of fresh fruits and vegetables.  Choose whole grains, beans, pasta, potatoes, and cereals.  Choose olive oil, corn oil, or canola oil. Only use small amounts.  Try not to eat butter, mayonnaise, shortening, or palm kernel oils.  Try not to eat foods with trans fats.  Choose  low-fat or nonfat dairy foods. ? Drink skim or nonfat milk. ? Eat low-fat or nonfat yogurt and cheeses. ? Try not to drink whole milk or cream. ? Try not to eat ice cream, egg yolks, or full-fat cheeses.  Healthy desserts include angel food cake, ginger snaps, animal crackers, hard candy, popsicles, and low-fat or nonfat frozen yogurt. Try not to eat pastries, cakes, pies, and cookies.  Exercise Follow your exercise program as told by your doctor.  Be more active. Try gardening, walking, and taking the stairs.  Ask your doctor about ways that you can be more active.  Medicine  Take over-the-counter and prescription medicines only as told by your doctor. This information is not intended to replace advice given to you by your health care provider. Make sure you discuss any questions you have with your health care provider.   Steps to Quit Smoking Smoking tobacco can be bad for your health. It can also affect almost every organ in your body. Smoking puts you and people around you at risk for many serious long-lasting (chronic) diseases. Quitting smoking is hard, but it is one of the best things that you can do for your health. It is never too late to quit. What are the benefits of quitting smoking? When you quit smoking, you lower your risk for getting serious diseases and conditions. They can include:  Lung cancer or lung disease.  Heart disease.  Stroke.  Heart attack.  Not being able to have children (infertility).  Weak bones (osteoporosis) and broken bones (fractures).  If you have coughing, wheezing, and shortness of breath, those symptoms may get better when you quit. You may also get sick less often. If you are pregnant, quitting smoking can help to lower your chances of having a baby of low birth weight. What can I do to help me quit smoking? Talk with your doctor about what can help you quit smoking. Some things you can do (strategies) include:  Quitting smoking  totally, instead of slowly cutting back how much you smoke over a period of time.  Going to in-person counseling. You are more likely to quit if you go to many counseling sessions.  Using resources and support systems, such as: ? Database administrator with a Social worker. ? Phone quitlines. ? Careers information officer. ? Support groups or group counseling. ? Text messaging programs. ? Mobile phone apps or applications.  Taking medicines. Some of these medicines may have nicotine in them. If you are pregnant or breastfeeding, do not take any medicines to quit smoking unless your doctor says it is okay. Talk with your doctor about counseling or other things that can help you.  Talk with your doctor about using more than one strategy at the same time, such as taking medicines while you are also going to in-person counseling. This can help make quitting easier. What things can I do to make it easier to quit? Quitting smoking might feel very hard at first, but there is a lot that you can do to make it easier. Take these steps:  Talk to your family and friends. Ask them to support and encourage you.  Call phone quitlines, reach out to support groups, or work with a Social worker.  Ask people who smoke to not smoke around you.  Avoid places that make you want (trigger) to smoke, such as: ? Bars. ? Parties. ? Smoke-break areas at work.  Spend time with people who do not smoke.  Lower the stress in your life. Stress can make you want to smoke. Try these things to help your stress: ? Getting regular exercise. ? Deep-breathing exercises. ? Yoga. ? Meditating. ? Doing a body scan. To do this, close your eyes, focus on one area of your body at a time from head to toe, and notice which parts of your body are tense. Try to relax the muscles in those areas.  Download or buy apps on your mobile phone or tablet that can help you stick to your quit plan. There are many free apps, such as QuitGuide from the [employer]  Office manager for Disease Control and Prevention). You can find more support from smokefree.gov and other websites.  This information is not intended to replace advice given to you by your health care provider. Make sure you discuss any questions you have with your health care provider. Document Released: 07/13/2009 Document Revised: 05/14/2016 Document Reviewed: 01/31/2015 Elsevier Interactive Patient Education  2018 [place] Released: 12/13/2008 Document Revised: 04/17/2016 Document Reviewed: 03/28/2016 Elsevier Interactive Patient Education  2018 [employer].

## 2017-12-26 NOTE — Addendum Note (Signed)
Addended by: Fredderick Severance on: 12/26/2017 03:55 PM   Modules accepted: Orders

## 2017-12-27 LAB — HEMOGLOBIN A1C
Hgb A1c MFr Bld: 8.4 % of total Hgb — ABNORMAL HIGH (ref <5.7)
MEAN PLASMA GLUCOSE: 194 (calc)
eAG (mmol/L): 10.8 (calc)

## 2017-12-27 LAB — COMPLETE METABOLIC PANEL WITH GFR
AG RATIO: 1.7 (calc) (ref 1.0–2.5)
ALBUMIN MSPROF: 4.3 g/dL (ref 3.6–5.1)
ALT: 13 U/L (ref 9–46)
AST: 28 U/L (ref 10–35)
Alkaline phosphatase (APISO): 69 U/L (ref 40–115)
BUN: 23 mg/dL (ref 7–25)
CALCIUM: 9.7 mg/dL (ref 8.6–10.3)
CO2: 27 mmol/L (ref 20–32)
CREATININE: 1.16 mg/dL (ref 0.70–1.25)
Chloride: 105 mmol/L (ref 98–110)
GFR, EST AFRICAN AMERICAN: 75 mL/min/{1.73_m2} (ref >=60)
GFR, EST NON AFRICAN AMERICAN: 64 mL/min/{1.73_m2} (ref >=60)
GLOBULIN: 2.6 g/dL (ref 1.9–3.7)
Glucose, Bld: 160 mg/dL — ABNORMAL HIGH (ref 65–139)
Potassium: 4 mmol/L (ref 3.5–5.3)
SODIUM: 139 mmol/L (ref 135–146)
Total Bilirubin: 0.7 mg/dL (ref 0.2–1.2)
Total Protein: 6.9 g/dL (ref 6.1–8.1)

## 2017-12-27 LAB — LIPID PANEL
Cholesterol: 195 mg/dL (ref <200)
HDL: 37 mg/dL — ABNORMAL LOW (ref >40)
LDL Cholesterol (Calc): 130 mg/dL (calc) — ABNORMAL HIGH
NON-HDL CHOLESTEROL (CALC): 158 mg/dL — AB (ref <130)
Total CHOL/HDL Ratio: 5.3 (calc) — ABNORMAL HIGH (ref <5.0)
Triglycerides: 158 mg/dL — ABNORMAL HIGH (ref <150)

## 2017-12-29 ENCOUNTER — Other Ambulatory Visit: Payer: Self-pay

## 2017-12-29 ENCOUNTER — Other Ambulatory Visit: Payer: Self-pay | Admitting: Nurse Practitioner

## 2017-12-29 DIAGNOSIS — E1121 Type 2 diabetes mellitus with diabetic nephropathy: Secondary | ICD-10-CM

## 2017-12-29 DIAGNOSIS — Z86010 Personal history of colonic polyps: Secondary | ICD-10-CM

## 2017-12-29 MED ORDER — EMPAGLIFLOZIN-METFORMIN HCL ER 25-1000 MG PO TB24: 1.0000 | ORAL_TABLET | Freq: Every morning | ORAL | 0 refills | Status: DC

## 2017-12-31 ENCOUNTER — Telehealth: Payer: Self-pay

## 2017-12-31 NOTE — Telephone Encounter (Signed)
Gastroenterology Pre-Procedure Review  Request Date:  Requesting Physician: Dr.   PATIENT REVIEW QUESTIONS: The patient responded to the following health history questions as indicated:    1. Are you having any GI issues? no 2. Do you have a personal history of Polyps? yes (Hx of polyps) 3. Do you have a family history of Colon Cancer or Polyps? no 4. Diabetes Mellitus? no 5. Joint replacements in the past 12 months?no 6. Major health problems in the past 3 months?no 7. Any artificial heart valves, MVP, or defibrillator?no    MEDICATIONS & ALLERGIES:    Patient reports the following regarding taking any anticoagulation/antiplatelet therapy:   Plavix, Coumadin, Eliquis, Xarelto, Lovenox, Pradaxa, Brilinta, or Effient? no Aspirin? yes (ASA 81mg )  Patient confirms/reports the following medications:  Current Outpatient Medications  Medication Sig Dispense Refill  . amLODipine (NORVASC) 10 MG tablet Take 1 tablet (10 mg total) by mouth daily. 90 tablet 1  . aspirin EC 81 MG tablet Take 1 tablet (81 mg total) by mouth daily. 30 tablet 0  . B Complex Vitamins (VITAMIN-B COMPLEX) TABS Take by mouth.    . Cholecalciferol (VITAMIN D3) 2000 units capsule Take by mouth.    . DOCOSAHEXAENOIC ACID PO Take by mouth.    . Empagliflozin-metFORMIN HCl ER (SYNJARDY XR) 25-1000 MG TB24 Take 1 tablet by mouth every morning. 90 tablet 0  . hydrochlorothiazide (HYDRODIURIL) 25 MG tablet Take 1 tablet (25 mg total) by mouth daily. 90 tablet 0  . losartan (COZAAR) 100 MG tablet Take 1 tablet (100 mg total) by mouth daily. 90 tablet 0  . metFORMIN (GLUCOPHAGE) 1000 MG tablet Take 1 tablet (1,000 mg total) by mouth daily with breakfast. 90 tablet 1  . Multiple Vitamins-Minerals (MULTIVITAMIN WITH MINERALS) tablet Take by mouth.    . sildenafil (VIAGRA) 100 MG tablet Take 1 tablet (100 mg total) by mouth daily as needed for erectile dysfunction. 10 tablet 0  . vitamin C (ASCORBIC ACID) 500 MG tablet Take 500  mg by mouth.     No current facility-administered medications for this visit.     Patient confirms/reports the following allergies:  Allergies  Allergen Reactions  . Simvastatin Other (See Comments)    Muscle aches    No orders of the defined types were placed in this encounter.   AUTHORIZATION INFORMATION Primary Insurance: 1D#: Group #:  Secondary Insurance: 1D#: Group #:  SCHEDULE INFORMATION: Date: 01/23/18 Time: Location: [place]

## 2018-01-01 ENCOUNTER — Telehealth: Payer: Self-pay | Admitting: Family Medicine

## 2018-01-01 NOTE — Telephone Encounter (Signed)
Copied from [place] [id number]. Topic: Quick Communication - See Telephone Encounter >> Jan 01, 2018  4:52 PM Ivar Drape wrote: CRM for notification. See Telephone encounter for: 01/01/18. Patient would like the provider to know that he does not need another medication.  He has been eating the wrong things and his mom died and he got off the straight and narrow.  Spouse has him back on.

## 2018-01-13 ENCOUNTER — Telehealth: Payer: Self-pay | Admitting: Gastroenterology

## 2018-01-13 NOTE — Telephone Encounter (Signed)
Wife called and needs to cancel procedure. She stated he just had one two years ago and insurance will not pay for it.

## 2018-01-15 DIAGNOSIS — E119 Type 2 diabetes mellitus without complications: Secondary | ICD-10-CM | POA: Diagnosis not present

## 2018-01-15 DIAGNOSIS — H2513 Age-related nuclear cataract, bilateral: Secondary | ICD-10-CM | POA: Diagnosis not present

## 2018-01-15 LAB — HM DIABETES EYE EXAM

## 2018-01-19 ENCOUNTER — Encounter: Payer: Self-pay | Admitting: Family Medicine

## 2018-01-19 DIAGNOSIS — H6122 Impacted cerumen, left ear: Secondary | ICD-10-CM | POA: Diagnosis not present

## 2018-01-19 DIAGNOSIS — H60332 Swimmer's ear, left ear: Secondary | ICD-10-CM | POA: Diagnosis not present

## 2018-01-21 DIAGNOSIS — H60332 Swimmer's ear, left ear: Secondary | ICD-10-CM | POA: Diagnosis not present

## 2018-01-23 ENCOUNTER — Other Ambulatory Visit: Payer: Self-pay | Admitting: Family Medicine

## 2018-01-23 ENCOUNTER — Ambulatory Visit
Admission: RE | Admit: 2018-01-23 | Payer: BLUE CROSS/BLUE SHIELD | Source: Ambulatory Visit | Admitting: Gastroenterology

## 2018-01-23 ENCOUNTER — Encounter: Admission: RE | Payer: Self-pay | Source: Ambulatory Visit

## 2018-01-23 DIAGNOSIS — I10 Essential (primary) hypertension: Secondary | ICD-10-CM

## 2018-01-23 SURGERY — COLONOSCOPY WITH PROPOFOL
Anesthesia: Choice

## 2018-01-28 ENCOUNTER — Other Ambulatory Visit: Payer: Self-pay | Admitting: Family Medicine

## 2018-01-28 DIAGNOSIS — I10 Essential (primary) hypertension: Secondary | ICD-10-CM

## 2018-01-28 DIAGNOSIS — E1121 Type 2 diabetes mellitus with diabetic nephropathy: Secondary | ICD-10-CM

## 2018-01-29 ENCOUNTER — Other Ambulatory Visit: Payer: Self-pay | Admitting: Nurse Practitioner

## 2018-01-29 NOTE — Telephone Encounter (Signed)
Hypertension medication request: HCTZ, Losartan, and Amlodipine to [facility].  Last office visit pertaining to hypertension: 12/26/2017   BP Readings from Last 3 Encounters:  12/26/17 130/70  09/10/17 124/74  06/20/17 112/62    Lab Results  Component Value Date   CREATININE 1.16 12/26/2017   BUN 23 12/26/2017   NA 139 12/26/2017   K 4.0 12/26/2017   CL 105 12/26/2017   CO2 27 12/26/2017   Follow up 04/10/2018

## 2018-03-09 ENCOUNTER — Other Ambulatory Visit: Payer: Self-pay | Admitting: Family Medicine

## 2018-03-10 ENCOUNTER — Encounter: Payer: Self-pay | Admitting: Family Medicine

## 2018-04-10 ENCOUNTER — Ambulatory Visit: Payer: BLUE CROSS/BLUE SHIELD | Admitting: Family Medicine

## 2018-05-14 ENCOUNTER — Ambulatory Visit (INDEPENDENT_AMBULATORY_CARE_PROVIDER_SITE_OTHER): Payer: BLUE CROSS/BLUE SHIELD | Admitting: Family Medicine

## 2018-05-14 ENCOUNTER — Encounter: Payer: Self-pay | Admitting: Family Medicine

## 2018-05-14 VITALS — BP 128/72 | HR 80 | Temp 98.2°F | Resp 14 | Ht 69.0 in | Wt 194.7 lb

## 2018-05-14 DIAGNOSIS — I10 Essential (primary) hypertension: Secondary | ICD-10-CM | POA: Diagnosis not present

## 2018-05-14 DIAGNOSIS — J438 Other emphysema: Secondary | ICD-10-CM

## 2018-05-14 DIAGNOSIS — N521 Erectile dysfunction due to diseases classified elsewhere: Secondary | ICD-10-CM | POA: Diagnosis not present

## 2018-05-14 DIAGNOSIS — I714 Abdominal aortic aneurysm, without rupture, unspecified: Secondary | ICD-10-CM

## 2018-05-14 DIAGNOSIS — E782 Mixed hyperlipidemia: Secondary | ICD-10-CM

## 2018-05-14 DIAGNOSIS — E1121 Type 2 diabetes mellitus with diabetic nephropathy: Secondary | ICD-10-CM

## 2018-05-14 DIAGNOSIS — E663 Overweight: Secondary | ICD-10-CM

## 2018-05-14 DIAGNOSIS — Z720 Tobacco use: Secondary | ICD-10-CM

## 2018-05-14 LAB — POCT UA - MICROALBUMIN: Microalbumin Ur, POC: 100 mg/L

## 2018-05-14 LAB — POCT GLYCOSYLATED HEMOGLOBIN (HGB A1C): Hemoglobin A1C: 7.1 % — AB (ref 4.0–5.6)

## 2018-05-14 MED ORDER — SILDENAFIL CITRATE 100 MG PO TABS: 100.0000 mg | ORAL_TABLET | Freq: Every day | ORAL | 2 refills | Status: DC | PRN

## 2018-05-14 MED ORDER — VALSARTAN-HYDROCHLOROTHIAZIDE 160-25 MG PO TABS
1.0000 | ORAL_TABLET | Freq: Every day | ORAL | 1 refills | Status: DC
Start: 2018-05-14 — End: 2022-02-12

## 2018-05-14 MED ORDER — AMLODIPINE BESYLATE 10 MG PO TABS: 10.0000 mg | ORAL_TABLET | Freq: Every day | ORAL | 1 refills | Status: DC

## 2018-05-14 MED ORDER — EZETIMIBE 10 MG PO TABS: 10.0000 mg | ORAL_TABLET | Freq: Every day | ORAL | 1 refills | Status: DC

## 2018-05-14 MED ORDER — METFORMIN HCL ER 750 MG PO TB24: 750.0000 mg | ORAL_TABLET | Freq: Every day | ORAL | 1 refills | Status: DC

## 2018-05-14 NOTE — Progress Notes (Signed)
Name: Daniel Fuentes   MRN: [id number]    DOB: 1950/11/20   Date:05/14/2018       Progress Note  Subjective  Chief Complaint  Chief Complaint  Patient presents with  . Follow-up    3 mth f/u  . Hypertension  . Hyperlipidemia  . Nicotine Dependence    HPI  DMII: he has a history of  microalbuminuria and also dyslipidemia, can't tolerate statin therapy and wants to hold off on starting Zetia. He is on ARB. He states he is only taking Metformin, but states has been walking more and hgbA1C is down from 8.4% to 7.1% today. He states he is not taking synjardi as prescribed on his last visit.  He has nocturia ( seen by Urologist in the past - does not want to take medication for it)  He is not checking glucose at home. He is due for an eye exam, reminded him again today. We will check foot and urine micro today   HTN: taking bp medication, denies chest pain or palpitation. He is not taking aspirin daily. He is not on statin because of intolerance  History of sub-dural hematoma: hit his head on the car door frame in August 2017, developed slurred speech a few days later went to [facility] and was found to have a subdural hematoma that had to be drained. He had physical therapy ( one session ) to improve his balance and he has been doing well since. He states he is 100% back to normal now. He still works as a Chief Executive Officer   Smoking: he is smoking about one pack a week, discussed importance of quitting smoking   Aorta aneurysm: found on US done in 2018, advised to quit smoking, try a different statin therapy and repeat US in 2021  COPD: emphysema on CT chest and is due for repeat today, he states only has SOB when walking for a long time, denies wheezing or cough  Coronary atherosclerosis: discussed statin therapy, he states he cannot tolerate it. Discussed Zetia again  Patient Active Problem List   Diagnosis Date Noted  . Pericardial effusion 09/11/2017  . LVH (left ventricular hypertrophy) 09/11/2017  .  Mixed hyperlipidemia 09/11/2017  . Coronary artery calcification of native artery 06/13/2017  . Emphysema lung ([place]) 06/13/2017  . Personal history of tobacco use, presenting hazards to health 06/12/2017  . Aneurysm, abdominal aortic ([place]) 06/10/2017  . History of traumatic head injury 04/30/2016  . Smoker 03/23/2016  . Allergic rhinitis 03/21/2016  . Controlled diabetes mellitus with nephropathy ([place]) 08/21/2015  . Hypertension 08/21/2015    Past Surgical History:  Procedure Laterality Date  . BRAIN SURGERY  2017   Subdural Hematoma    Family History  Problem Relation Age of Onset  . Heart failure Mother   . Diabetes Mother   . Hypertension Mother   . Hyperlipidemia Mother   . Cancer Father        Brain  . Diabetes Sister   . Heart disease Brother        Heart Attack - was on drugs    Social History   Socioeconomic History  . Marital status: Married    Spouse name: Benard Halsted  . Number of children: 1  . Years of education: Not on file  . Highest education level: Doctorate  Occupational History  . Occupation: Chief Executive Officer  Social Needs  . Financial resource strain: Not hard at all  . Food insecurity:    Worry: Never true  Inability: Never true  . Transportation needs:    Medical: No    Non-medical: No  Tobacco Use  . Smoking status: Current Some Day Smoker    Packs/day: 0.20    Years: 50.00    Pack years: 10.00    Types: Pipe, Cigarettes    Start date: 09/04/1966  . Smokeless tobacco: Never Used  Substance and Sexual Activity  . Alcohol use: Yes    Alcohol/week: 3.0 standard drinks    Types: 3 Shots of liquor per week  . Drug use: No  . Sexual activity: Yes    Partners: Female  Lifestyle  . Physical activity:    Days per week: 0 days    Minutes per session: 0 min  . Stress: Very much  Relationships  . Social connections:    Talks on phone: More than three times a week    Gets together: Never    Attends religious service: 1 to 4 times per year     Active member of club or organization: No    Attends meetings of clubs or organizations: Never    Relationship status: Married  . Intimate partner violence:    Fear of current or ex partner: No    Emotionally abused: No    Physically abused: No    Forced sexual activity: No  Other Topics Concern  . 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     Current Outpatient Medications:  .  amLODipine (NORVASC) 10 MG tablet, Take 1 tablet (10 mg total) by mouth daily., Disp: 90 tablet, Rfl: 1 .  Cholecalciferol (VITAMIN D3) 2000 units capsule, Take by mouth., Disp: , Rfl:  .  DOCOSAHEXAENOIC ACID PO, Take by mouth., Disp: , Rfl:  .  Multiple Vitamins-Minerals (MULTIVITAMIN WITH MINERALS) tablet, Take by mouth., Disp: , Rfl:  .  sildenafil (VIAGRA) 100 MG tablet, Take 1 tablet (100 mg total) by mouth daily as needed for erectile dysfunction., Disp: 10 tablet, Rfl: 2 .  vitamin C (ASCORBIC ACID) 500 MG tablet, Take 500 mg by mouth., Disp: , Rfl:  .  aspirin EC 81 MG tablet, Take 1 tablet (81 mg total) by mouth daily. (Patient not taking: Reported on 05/14/2018), Disp: 30 tablet, Rfl: 0 .  ezetimibe (ZETIA) 10 MG tablet, Take 1 tablet (10 mg total) by mouth daily., Disp: 90 tablet, Rfl: 1 .  metFORMIN (GLUCOPHAGE-XR) 750 MG 24 hr tablet, Take 1 tablet (750 mg total) by mouth daily with breakfast., Disp: 180 tablet, Rfl: 1 .  valsartan-hydrochlorothiazide (DIOVAN HCT) 160-25 MG tablet, Take 1 tablet by mouth daily., Disp: 90 tablet, Rfl: 1  Allergies  Allergen Reactions  . Simvastatin Other (See Comments)    Muscle aches     ROS  Constitutional: Negative for fever , positive for  weight change.  Respiratory: Negative for cough , she has some  shortness of breath when walking a lot .   Cardiovascular: Negative for chest pain or palpitations.  Gastrointestinal:  Negative for abdominal pain, no bowel changes.  Musculoskeletal: Negative for gait problem or joint swelling.  Skin: Negative for rash.  Neurological: Negative for dizziness or headache.  No other specific complaints in a complete review of systems (except as listed in HPI above).  Objective  Vitals:   05/14/18 1001  BP: 128/72  Pulse: 80  Resp: 14  Temp: 98.2 F (36.8 C)  TempSrc: Oral  SpO2: 96%  Weight: 194 lb 11.2 oz (88.3 kg)  Height: 5\' 9"  (1.753 m)    Body mass index is 28.75 kg/m.  Physical Exam  Constitutional: Patient appears well-developed and well-nourished. Overweight  No distress.  HEENT: head atraumatic, normocephalic, pupils equal and reactive to light, neck supple, throat within normal limits Cardiovascular: Normal rate, regular rhythm and normal heart sounds.  No murmur heard. No BLE edema. Pulmonary/Chest: Effort normal and breath sounds normal. No respiratory distress. Abdominal: Soft.  There is no tenderness. Psychiatric: Patient has a normal mood and affect. behavior is normal. Judgment and thought content normal.  Recent Results (from the past 2160 hour(s))  POCT glycosylated hemoglobin (Hb A1C)     Status: Abnormal   Collection Time: 05/14/18 10:13 AM  Result Value Ref Range   Hemoglobin A1C 7.1 (A) 4.0 - 5.6 %   HbA1c POC (<> result, manual entry)     HbA1c, POC (prediabetic range)     HbA1c, POC (controlled diabetic range)    POCT UA - Microalbumin     Status: Abnormal   Collection Time: 05/14/18 10:13 AM  Result Value Ref Range   Microalbumin Ur, POC 100 mg/L   Creatinine, POC     Albumin/Creatinine Ratio, Urine, POC      Diabetic Foot Exam: Diabetic Foot Exam - Simple   Simple Foot Form Diabetic Foot exam was performed with the following findings:  Yes 05/14/2018 10:48 AM  Visual Inspection No deformities, no ulcerations, no other skin breakdown bilaterally:  Yes Sensation Testing Intact to touch and monofilament testing bilaterally:   Yes Pulse Check Posterior Tibialis and Dorsalis pulse intact bilaterally:  Yes Comments     PHQ2/9: Depression screen [facility] 2/9 05/14/2018 01/06/2017 09/04/2016 08/21/2015  Decreased Interest 0 0 0 0  Down, Depressed, Hopeless 0 0 0 0  PHQ - 2 Score 0 0 0 0  Altered sleeping 0 - - -  Tired, decreased energy 0 - - -  Change in appetite 0 - - -  Feeling bad or failure about yourself  0 - - -  Trouble concentrating 0 - - -  Moving slowly or fidgety/restless 0 - - -  Suicidal thoughts 0 - - -  PHQ-9 Score 0 - - -  Difficult doing work/chores Not difficult at all - - -     Fall Risk: Fall Risk  05/14/2018 05/26/2017 01/06/2017 09/04/2016 08/21/2015  Falls in the past year? No No Yes Yes No  Number falls in past yr: - - 1 1 -  Injury with Fall? - - Yes Yes -  Follow up - - Falls evaluation completed - -     Functional Status Survey: Is the patient deaf or have difficulty hearing?: No Does the patient have difficulty seeing, even when wearing glasses/contacts?: No Does the patient have difficulty concentrating, remembering, or making decisions?: No Does the patient have difficulty walking or climbing stairs?: No Does the patient have difficulty dressing or bathing?: No Does the patient have difficulty doing errands alone such as visiting a doctor's office or shopping?: No    Assessment & Plan  1. Controlled diabetes mellitus with nephropathy (HCC)  - POCT glycosylated hemoglobin (Hb A1C) - POCT UA - Microalbumin - metFORMIN (GLUCOPHAGE-XR) 750 MG 24 hr tablet; Take 1 tablet (750 mg total) by mouth daily with breakfast.  Dispense: 180 tablet; Refill: 1  2. Essential hypertension  - amLODipine (NORVASC) 10 MG tablet; Take 1 tablet (10 mg total) by  mouth daily.  Dispense: 90 tablet; Refill: 1 - valsartan-hydrochlorothiazide (DIOVAN HCT) 160-25 MG tablet; Take 1 tablet by mouth daily.  Dispense: 90 tablet; Refill: 1  3. Mixed hyperlipidemia  He is willing to try Zetia 10 mg daily  #90 and one refill  4. Erectile dysfunction due to diseases classified elsewhere  - sildenafil (VIAGRA) 100 MG tablet; Take 1 tablet (100 mg total) by mouth daily as needed for erectile dysfunction.  Dispense: 10 tablet; Refill: 2  5. Overweight (BMI 25.0-29.9)  He lost weight since last visit, he was working on a case in [place] and had to walk more often  7. Other emphysema ([place])  Not on medication discussed importance of quitting smoking   8. Abdominal aortic aneurysm (AAA) without rupture ([place])  Recheck [place] in 05/2020

## 2018-05-14 NOTE — Patient Instructions (Signed)
You need to call Dr.Whol and re-schedule colonoscopy

## 2018-05-19 ENCOUNTER — Telehealth: Payer: Self-pay | Admitting: *Deleted

## 2018-05-19 NOTE — Telephone Encounter (Signed)
Attempted to contact patient r/t LDCT Screening follow up due at this time.  No answer received, message left for patient to call [phone] to schedule appointment.    

## 2018-05-21 ENCOUNTER — Telehealth: Payer: Self-pay | Admitting: Nurse Practitioner

## 2018-05-22 ENCOUNTER — Other Ambulatory Visit: Payer: Self-pay | Admitting: *Deleted

## 2018-05-22 DIAGNOSIS — Z122 Encounter for screening for malignant neoplasm of respiratory organs: Secondary | ICD-10-CM

## 2018-05-22 NOTE — Progress Notes (Signed)
[id number]

## 2018-05-26 ENCOUNTER — Telehealth: Payer: Self-pay | Admitting: *Deleted

## 2018-05-26 NOTE — Telephone Encounter (Signed)
Attempted to notify patient of appointment for LDCT screening on Friday 06/12/2018 @2 :05pm here @ [facility], no answer, message left with appt and to call back to [phone] with questions or changes.

## 2018-06-08 ENCOUNTER — Ambulatory Visit: Payer: BLUE CROSS/BLUE SHIELD | Admitting: Family Medicine

## 2018-06-12 ENCOUNTER — Ambulatory Visit: Payer: BLUE CROSS/BLUE SHIELD

## 2018-06-12 ENCOUNTER — Ambulatory Visit: Admission: RE | Admit: 2018-06-12 | Payer: BLUE CROSS/BLUE SHIELD | Source: Ambulatory Visit

## 2018-06-24 ENCOUNTER — Encounter: Payer: Self-pay | Admitting: *Deleted

## 2018-06-24 ENCOUNTER — Telehealth: Payer: Self-pay | Admitting: *Deleted

## 2018-06-24 NOTE — Telephone Encounter (Signed)
Attempted to contact patient r/t LDCT Screening follow up due at this time.  No answer received, message left for patient to call [phone] to schedule appointment.    

## 2018-07-09 ENCOUNTER — Other Ambulatory Visit: Payer: Self-pay | Admitting: Family Medicine

## 2018-07-09 NOTE — Telephone Encounter (Signed)
Hypertension medication request: Losartan to [facility].   Last office visit pertaining to hypertension: 05/14/2018   BP Readings from Last 3 Encounters:  05/14/18 128/72  12/26/17 130/70  09/10/17 124/74    Lab Results  Component Value Date   CREATININE 1.16 12/26/2017   BUN 23 12/26/2017   NA 139 12/26/2017   K 4.0 12/26/2017   CL 105 12/26/2017   CO2 27 12/26/2017     Follow up 10/09/2018

## 2018-07-10 ENCOUNTER — Ambulatory Visit: Payer: BLUE CROSS/BLUE SHIELD

## 2018-08-06 DIAGNOSIS — E119 Type 2 diabetes mellitus without complications: Secondary | ICD-10-CM | POA: Diagnosis not present

## 2018-08-06 DIAGNOSIS — Z0000 Encounter for general adult medical examination without abnormal findings: Secondary | ICD-10-CM | POA: Diagnosis not present

## 2018-08-06 DIAGNOSIS — Z6829 Body mass index (BMI) 29.0-29.9, adult: Secondary | ICD-10-CM | POA: Diagnosis not present

## 2018-08-06 DIAGNOSIS — I10 Essential (primary) hypertension: Secondary | ICD-10-CM | POA: Diagnosis not present

## 2018-10-09 ENCOUNTER — Ambulatory Visit: Payer: BLUE CROSS/BLUE SHIELD | Admitting: Family Medicine

## 2018-11-06 DIAGNOSIS — E119 Type 2 diabetes mellitus without complications: Secondary | ICD-10-CM | POA: Diagnosis not present

## 2018-11-06 DIAGNOSIS — R7989 Other specified abnormal findings of blood chemistry: Secondary | ICD-10-CM | POA: Diagnosis not present

## 2018-11-06 DIAGNOSIS — Z6829 Body mass index (BMI) 29.0-29.9, adult: Secondary | ICD-10-CM | POA: Diagnosis not present

## 2018-12-18 DIAGNOSIS — E113292 Type 2 diabetes mellitus with mild nonproliferative diabetic retinopathy without macular edema, left eye: Secondary | ICD-10-CM | POA: Diagnosis not present

## 2018-12-18 DIAGNOSIS — E119 Type 2 diabetes mellitus without complications: Secondary | ICD-10-CM | POA: Diagnosis not present

## 2019-02-05 DIAGNOSIS — R42 Dizziness and giddiness: Secondary | ICD-10-CM | POA: Diagnosis not present

## 2019-02-05 DIAGNOSIS — E1121 Type 2 diabetes mellitus with diabetic nephropathy: Secondary | ICD-10-CM | POA: Diagnosis not present

## 2019-02-05 DIAGNOSIS — Z8679 Personal history of other diseases of the circulatory system: Secondary | ICD-10-CM | POA: Diagnosis not present

## 2019-02-10 DIAGNOSIS — Z1211 Encounter for screening for malignant neoplasm of colon: Secondary | ICD-10-CM | POA: Diagnosis not present

## 2019-02-10 DIAGNOSIS — E1121 Type 2 diabetes mellitus with diabetic nephropathy: Secondary | ICD-10-CM | POA: Diagnosis not present

## 2019-02-10 DIAGNOSIS — Z6829 Body mass index (BMI) 29.0-29.9, adult: Secondary | ICD-10-CM | POA: Diagnosis not present

## 2019-03-28 ENCOUNTER — Other Ambulatory Visit: Payer: Self-pay | Admitting: Family Medicine

## 2019-03-28 DIAGNOSIS — I10 Essential (primary) hypertension: Secondary | ICD-10-CM

## 2019-03-29 NOTE — Telephone Encounter (Signed)
lvm for pt to schedule appt °

## 2019-04-02 ENCOUNTER — Other Ambulatory Visit: Payer: Self-pay | Admitting: Family Medicine

## 2019-04-02 DIAGNOSIS — I10 Essential (primary) hypertension: Secondary | ICD-10-CM

## 2019-11-08 ENCOUNTER — Ambulatory Visit: Payer: BC Managed Care – PPO | Attending: Internal Medicine

## 2019-11-08 DIAGNOSIS — Z23 Encounter for immunization: Secondary | ICD-10-CM | POA: Insufficient documentation

## 2019-11-08 NOTE — Progress Notes (Signed)
   Covid-19 Vaccination Clinic  Name:  Kristina Schriver    MRN: [id number] DOB: 10/05/1950  11/08/2019  Mr. Kunkler was observed post Covid-19 immunization for 15 minutes without incidence. He was provided with Vaccine Information Sheet and instruction to access the V-Safe system.   Mr. Koob was instructed to call 911 with any severe reactions post vaccine: Marland Kitchen Difficulty breathing  . Swelling of your face and throat  . A fast heartbeat  . A bad rash all over your body  . Dizziness and weakness    Immunizations Administered    Name Date Dose VIS Date Route   Moderna COVID-19 Vaccine 11/08/2019 11:11 AM 0.5 mL 08/31/2019 Intramuscular   Manufacturer: Moderna   Lot: [id number]   [id number]

## 2019-12-08 ENCOUNTER — Ambulatory Visit: Payer: BC Managed Care – PPO | Attending: Internal Medicine

## 2019-12-08 DIAGNOSIS — Z23 Encounter for immunization: Secondary | ICD-10-CM

## 2019-12-08 NOTE — Progress Notes (Signed)
   Covid-19 Vaccination Clinic  Name:  Daniel Fuentes    MRN: [id number] DOB: Jun 16, 1951  12/08/2019  Mr. Roh was observed post Covid-19 immunization for 15 minutes without incident. He was provided with Vaccine Information Sheet and instruction to access the V-Safe system.   Mr. Hannasch was instructed to call 911 with any severe reactions post vaccine: Marland Kitchen Difficulty breathing  . Swelling of face and throat  . A fast heartbeat  . A bad rash all over body  . Dizziness and weakness   Immunizations Administered    Name Date Dose VIS Date Route   Moderna COVID-19 Vaccine 12/08/2019 10:57 AM 0.5 mL 08/31/2019 Intramuscular   Manufacturer: Moderna   Lot: [id number]   [id number]

## 2020-01-03 ENCOUNTER — Other Ambulatory Visit: Payer: Self-pay

## 2020-01-03 ENCOUNTER — Ambulatory Visit (INDEPENDENT_AMBULATORY_CARE_PROVIDER_SITE_OTHER): Payer: BC Managed Care – PPO | Admitting: Podiatry

## 2020-01-03 ENCOUNTER — Encounter: Payer: Self-pay | Admitting: Podiatry

## 2020-01-03 DIAGNOSIS — E1121 Type 2 diabetes mellitus with diabetic nephropathy: Secondary | ICD-10-CM

## 2020-01-03 DIAGNOSIS — Q828 Other specified congenital malformations of skin: Secondary | ICD-10-CM | POA: Insufficient documentation

## 2020-01-03 NOTE — Progress Notes (Signed)
This patient returns to my office for at risk foot care.  This patient requires this care by a professional since this patient will be at risk due to having diabetes with neuropathy.  Patient has callus that has been present for 2 months and is now getting sore.  His wife told him there is no evidennce of foreign body.  This patient is unable to trim the callus  Himself.  The callus is  painful walking and wearing shoes.  This patient presents for at risk foot care today.  General Appearance  Alert, conversant and in no acute stress.  Vascular  Dorsalis pedis and posterior tibial  pulses are not  palpable  bilaterally.  Capillary return is within normal limits  bilaterally. Temperature is within normal limits  bilaterally.  Neurologic  Senn-Weinstein monofilament wire test within normal limits  bilaterally. Muscle power within normal limits bilaterally.  Nails Thick disfigured discolored nails with subungual debris .  No pain associated with his nails.  Orthopedic  No limitations of motion  feet .  No crepitus or effusions noted.  No bony pathology or digital deformities noted.  Skin  normotropic skin with no porokeratosis noted bilaterally.  No signs of infections or ulcers noted.   Porokeratosis sub 1 midshalt left foot.  Porokeratosis left foot.  IE.  Debride porokeratosis with # 15 blade.  Discussed conservative vs. Surgical excision of this lesion.  Told him to let the lesion declare itself and use a pumice stone to trim the porokeratosis.  RTC prn.   Return office visit    prn                    Gardiner Barefoot DPM

## 2021-05-15 ENCOUNTER — Telehealth (INDEPENDENT_AMBULATORY_CARE_PROVIDER_SITE_OTHER): Payer: Self-pay | Admitting: Gastroenterology

## 2021-05-15 DIAGNOSIS — Z1211 Encounter for screening for malignant neoplasm of colon: Secondary | ICD-10-CM

## 2021-05-15 MED ORDER — PEG 3350-KCL-NA BICARB-NACL 420 G PO SOLR: 4000.0000 mL | Freq: Once | ORAL | 0 refills | Status: AC

## 2021-05-15 NOTE — Progress Notes (Signed)
Gastroenterology Pre-Procedure Review  Request Date: 05/30/21 Requesting Physician: Dr. Marius Ditch  PATIENT REVIEW QUESTIONS: The patient responded to the following health history questions as indicated:    1. Are you having any GI issues? no 2. Do you have a personal history of Polyps? no 3. Do you have a family history of Colon Cancer or Polyps? no 4. Diabetes Mellitus? yes (Type II) 5. Joint replacements in the past 12 months?no 6. Major health problems in the past 3 months?no 7. Any artificial heart valves, MVP, or defibrillator?no    MEDICATIONS & ALLERGIES:    Patient reports the following regarding taking any anticoagulation/antiplatelet therapy:   Plavix, Coumadin, Eliquis, Xarelto, Lovenox, Pradaxa, Brilinta, or Effient? no Aspirin?  81 mg. Pt states he does not take.  Patient confirms/reports the following medications:  Current Outpatient Medications  Medication Sig Dispense Refill   Alirocumab 150 MG/ML SOAJ Inject into the skin.     amLODipine (NORVASC) 5 MG tablet Take 5 mg by mouth daily.     Cholecalciferol (VITAMIN D3) 2000 units capsule Take by mouth.     DOCOSAHEXAENOIC ACID PO Take by mouth.     ezetimibe (ZETIA) 10 MG tablet Take 1 tablet (10 mg total) by mouth daily. 90 tablet 1   hydrochlorothiazide (HYDRODIURIL) 25 MG tablet Take 25 mg by mouth daily.     losartan (COZAAR) 100 MG tablet TAKE 1 TABLET BY MOUTH ONCE DAILY 90 tablet 0   metFORMIN (GLUCOPHAGE) 1000 MG tablet Take 1,000 mg by mouth daily.     Multiple Vitamins-Minerals (MULTIVITAMIN WITH MINERALS) tablet Take by mouth.     solifenacin (VESICARE) 5 MG tablet Take 5 mg by mouth daily.     valsartan (DIOVAN) 160 MG tablet Take 160 mg by mouth daily.     valsartan-hydrochlorothiazide (DIOVAN HCT) 160-25 MG tablet Take 1 tablet by mouth daily. 90 tablet 1   vitamin C (ASCORBIC ACID) 500 MG tablet Take 500 mg by mouth.     aspirin EC 81 MG tablet Take 1 tablet (81 mg total) by mouth daily. (Patient not  taking: Reported on 05/15/2021) 30 tablet 0   sildenafil (VIAGRA) 100 MG tablet Take by mouth.     No current facility-administered medications for this visit.    Patient confirms/reports the following allergies:  Allergies  Allergen Reactions   Simvastatin Other (See Comments)    Muscle aches    No orders of the defined types were placed in this encounter.   AUTHORIZATION INFORMATION Primary Insurance: 1D#: Group #:  Secondary Insurance: 1D#: Group #:  SCHEDULE INFORMATION: Date: 05/30/21 Time: Location: [place]

## 2021-05-29 ENCOUNTER — Encounter: Payer: Self-pay | Admitting: Gastroenterology

## 2021-05-30 ENCOUNTER — Encounter
Admission: RE | Disposition: A | Discharge status: Home / Self Care | Payer: Self-pay | Source: Home / Self Care | Attending: Gastroenterology

## 2021-05-30 ENCOUNTER — Encounter: Payer: Self-pay | Admitting: Gastroenterology

## 2021-05-30 ENCOUNTER — Ambulatory Visit: Payer: Medicare Other | Admitting: Anesthesiology

## 2021-05-30 ENCOUNTER — Ambulatory Visit
Admission: RE | Admit: 2021-05-30 | Discharge: 2021-05-30 | Disposition: A | Discharge status: Home / Self Care | Payer: Medicare Other | Attending: Gastroenterology | Admitting: Gastroenterology

## 2021-05-30 DIAGNOSIS — Z79899 Other long term (current) drug therapy: Secondary | ICD-10-CM | POA: Insufficient documentation

## 2021-05-30 DIAGNOSIS — Z833 Family history of diabetes mellitus: Secondary | ICD-10-CM | POA: Diagnosis not present

## 2021-05-30 DIAGNOSIS — K5730 Diverticulosis of large intestine without perforation or abscess without bleeding: Secondary | ICD-10-CM | POA: Insufficient documentation

## 2021-05-30 DIAGNOSIS — D120 Benign neoplasm of cecum: Secondary | ICD-10-CM | POA: Diagnosis not present

## 2021-05-30 DIAGNOSIS — F17290 Nicotine dependence, other tobacco product, uncomplicated: Secondary | ICD-10-CM | POA: Insufficient documentation

## 2021-05-30 DIAGNOSIS — K635 Polyp of colon: Secondary | ICD-10-CM | POA: Diagnosis not present

## 2021-05-30 DIAGNOSIS — Z7984 Long term (current) use of oral hypoglycemic drugs: Secondary | ICD-10-CM | POA: Diagnosis not present

## 2021-05-30 DIAGNOSIS — F17210 Nicotine dependence, cigarettes, uncomplicated: Secondary | ICD-10-CM | POA: Insufficient documentation

## 2021-05-30 DIAGNOSIS — Z888 Allergy status to other drugs, medicaments and biological substances status: Secondary | ICD-10-CM | POA: Insufficient documentation

## 2021-05-30 DIAGNOSIS — Z1211 Encounter for screening for malignant neoplasm of colon: Secondary | ICD-10-CM

## 2021-05-30 HISTORY — PX: COLONOSCOPY WITH PROPOFOL: SHX5780

## 2021-05-30 LAB — GLUCOSE, CAPILLARY: Glucose-Capillary: 116 mg/dL — ABNORMAL HIGH (ref 70–99)

## 2021-05-30 SURGERY — COLONOSCOPY WITH PROPOFOL
Anesthesia: General

## 2021-05-30 MED ORDER — PROPOFOL 500 MG/50ML IV EMUL
INTRAVENOUS | Status: DC | PRN
  Administered 2021-05-30: 175 ug/kg/min via INTRAVENOUS

## 2021-05-30 MED ORDER — SODIUM CHLORIDE 0.9 % IV SOLN: INTRAVENOUS | Status: DC

## 2021-05-30 MED ORDER — LIDOCAINE HCL (PF) 2 % IJ SOLN
INTRAMUSCULAR | Status: AC
  Filled 2021-05-30: qty 5

## 2021-05-30 MED ORDER — GLYCOPYRROLATE 0.2 MG/ML IJ SOLN
INTRAMUSCULAR | Status: DC | PRN
  Administered 2021-05-30: .2 mg via INTRAVENOUS

## 2021-05-30 MED ORDER — PROPOFOL 10 MG/ML IV BOLUS
INTRAVENOUS | Status: DC | PRN
  Administered 2021-05-30: 70 mg via INTRAVENOUS

## 2021-05-30 MED ORDER — LIDOCAINE HCL (CARDIAC) PF 100 MG/5ML IV SOSY
PREFILLED_SYRINGE | INTRAVENOUS | Status: DC | PRN
  Administered 2021-05-30: 50 mg via INTRAVENOUS

## 2021-05-30 MED ORDER — PROPOFOL 500 MG/50ML IV EMUL
INTRAVENOUS | Status: AC
  Filled 2021-05-30: qty 50

## 2021-05-30 NOTE — Anesthesia Preprocedure Evaluation (Signed)
Anesthesia Evaluation  Patient identified by MRN, date of birth, ID band Patient awake    Reviewed: Allergy & Precautions, NPO status , Patient's Chart, lab work & pertinent test results  History of Anesthesia Complications Negative for: history of anesthetic complications  Airway Mallampati: III  TM Distance: >3 FB Neck ROM: full    Dental  (+) Chipped   Pulmonary neg shortness of breath, COPD, Current Smoker,    Pulmonary exam normal        Cardiovascular Exercise Tolerance: Good hypertension, (-) angina+ CAD  Normal cardiovascular exam     Neuro/Psych negative neurological ROS  negative psych ROS   GI/Hepatic negative GI ROS, Neg liver ROS,   Endo/Other  diabetes, Type 2  Renal/GU Renal disease  negative genitourinary   Musculoskeletal   Abdominal   Peds  Hematology negative hematology ROS (+)   Anesthesia Other Findings Past Medical History: No date: Diabetes mellitus without complication (HCC) No date: Hyperlipidemia No date: Hypertension  Past Surgical History: 2017: BRAIN SURGERY     Comment:  Subdural Hematoma  BMI    Body Mass Index: 27.32 kg/m      Reproductive/Obstetrics negative OB ROS                             Anesthesia Physical Anesthesia Plan  ASA: 3  Anesthesia Plan: General   Post-op Pain Management:    Induction: Intravenous  PONV Risk Score and Plan: Propofol infusion and TIVA  Airway Management Planned: Natural Airway and Nasal Cannula  Additional Equipment:   Intra-op Plan:   Post-operative Plan:   Informed Consent: I have reviewed the patients History and Physical, chart, labs and discussed the procedure including the risks, benefits and alternatives for the proposed anesthesia with the patient or authorized representative who has indicated his/her understanding and acceptance.     Dental Advisory Given  Plan Discussed with:  Anesthesiologist, CRNA and Surgeon  Anesthesia Plan Comments: (Patient consented for risks of anesthesia including but not limited to:  - adverse reactions to medications - risk of airway placement if required - damage to eyes, teeth, lips or other oral mucosa - nerve damage due to positioning  - sore throat or hoarseness - Damage to heart, brain, nerves, lungs, other parts of body or loss of life  Patient voiced understanding.)        Anesthesia Quick Evaluation

## 2021-05-30 NOTE — Op Note (Signed)
[facility] Gastroenterology Patient Name: Daniel Fuentes Procedure Date: 05/30/2021 10:32 AM MRN: [id number] Account #: [id number] Date of Birth: April 23, 1951 Admit Type: Outpatient Age: 70 Room: [facility] ENDO ROOM 4 Gender: Male Note Status: Finalized Procedure:             Colonoscopy Indications:           Screening for colorectal malignant neoplasm Providers:             Lin Landsman MD, MD Referring MD:          No Local Md, MD (Referring MD) Medicines:             General Anesthesia Complications:         No immediate complications. Estimated blood loss: None. Procedure:             Pre-Anesthesia Assessment:                        - Prior to the procedure, a History and Physical was                         performed, and patient medications and allergies were                         reviewed. The patient is competent. The risks and                         benefits of the procedure and the sedation options and                         risks were discussed with the patient. All questions                         were answered and informed consent was obtained.                         Patient identification and proposed procedure were                         verified by the physician, the nurse, the                         anesthesiologist, the anesthetist and the technician                         in the pre-procedure area in the procedure room in the                         endoscopy suite. Mental Status Examination: alert and                         oriented. Airway Examination: normal oropharyngeal                         airway and neck mobility. Respiratory Examination:                         clear to auscultation. CV Examination: normal.  Prophylactic Antibiotics: The patient does not require                         prophylactic antibiotics. Prior Anticoagulants: The                         patient has taken no previous anticoagulant  or                         antiplatelet agents. ASA Grade Assessment: III - A                         patient with severe systemic disease. After reviewing                         the risks and benefits, the patient was deemed in                         satisfactory condition to undergo the procedure. The                         anesthesia plan was to use general anesthesia.                         Immediately prior to administration of medications,                         the patient was re-assessed for adequacy to receive                         sedatives. The heart rate, respiratory rate, oxygen                         saturations, blood pressure, adequacy of pulmonary                         ventilation, and response to care were monitored                         throughout the procedure. The physical status of the                         patient was re-assessed after the procedure.                        After obtaining informed consent, the colonoscope was                         passed under direct vision. Throughout the procedure,                         the patient's blood pressure, pulse, and oxygen                         saturations were monitored continuously. The                         Colonoscope was introduced through the anus and  advanced to the the cecum, identified by appendiceal                         orifice and ileocecal valve. The colonoscopy was                         unusually difficult due to inadequate bowel prep,                         significant looping and the patient's body habitus.                         Successful completion of the procedure was aided by                         applying abdominal pressure and lavage. The patient                         tolerated the procedure well. The quality of the bowel                         preparation was adequate to identify polyps 6 mm and                         larger in  size. Findings:      The perianal and digital rectal examinations were normal. Pertinent       negatives include normal sphincter tone and no palpable rectal lesions.      Three sessile polyps were found in the descending colon and cecum. The       polyps were 3 to 5 mm in size. These polyps were removed with a cold       snare. Resection and retrieval were complete. Estimated blood loss: none.      Multiple diverticula were found in the sigmoid colon.      The retroflexed view of the distal rectum and anal verge was normal and       showed no anal or rectal abnormalities.      Extensive amounts of liquid stool was found in the entire colon,       precluding visualization. Lavage of the area was performed using 50 -       200 mL of sterile water, resulting in clearance with good visualization. Impression:            - Three 3 to 5 mm polyps in the descending colon and                         in the cecum, removed with a cold snare. Resected and                         retrieved.                        - Diverticulosis in the sigmoid colon.                        - The distal rectum and anal verge are normal on  retroflexion view.                        - Stool in the entire examined colon. Recommendation:        - Discharge patient to home (with escort).                        - Resume previous diet today.                        - Continue present medications.                        - Await pathology results.                        - Repeat colonoscopy in 3 - 5 years for surveillance. Procedure Code(s):     --- Professional ---                        [phone], Colonoscopy, flexible; with removal of                         tumor(s), polyp(s), or other lesion(s) by snare                         technique Diagnosis Code(s):     --- Professional ---                        Z12.11, Encounter for screening for malignant neoplasm                         of colon                         K63.5, Polyp of colon                        K57.30, Diverticulosis of large intestine without                         perforation or abscess without bleeding CPT copyright 2019 American Medical Association. All rights reserved. The codes documented in this report are preliminary and upon coder review may  be revised to meet current compliance requirements. Dr. Ulyess Mort Lin Landsman MD, MD 05/30/2021 11:15:47 AM This report has been signed electronically. Number of Addenda: 0 Note Initiated On: 05/30/2021 10:32 AM Scope Withdrawal Time: 0 hours 22 minutes 25 seconds  Total Procedure Duration: 0 hours 25 minutes 56 seconds  Estimated Blood Loss:  Estimated blood loss: none.      [facility]

## 2021-05-30 NOTE — H&P (Signed)
Daniel Darby, MD [address]  [place], [place] [address]  Main: [phone]  Fax: [phone] Pager: [phone]  Primary Care Physician:  Romualdo Bolk, FNP Primary Gastroenterologist:  Dr. Cephas Fuentes  Pre-Procedure History & Physical: HPI:  Daniel Fuentes is a 70 y.o. male is here for an colonoscopy.   Past Medical History:  Diagnosis Date   Diabetes mellitus without complication ([place])    Hyperlipidemia    Hypertension     Past Surgical History:  Procedure Laterality Date   BRAIN SURGERY  2017   Subdural Hematoma    Prior to Admission medications   Medication Sig Start Date End Date Taking? Authorizing Provider  Alirocumab 150 MG/ML SOAJ Inject into the skin. 12/24/19   [provider]  amLODipine (NORVASC) 5 MG tablet Take 5 mg by mouth daily. 11/24/19   [provider]  aspirin EC 81 MG tablet Take 1 tablet (81 mg total) by mouth daily. Patient not taking: Reported on 05/15/2021 06/10/17   Hubbard Hartshorn, FNP  Cholecalciferol (VITAMIN D3) 2000 units capsule Take by mouth.    [provider]  DOCOSAHEXAENOIC ACID PO Take by mouth.    [provider]  ezetimibe (ZETIA) 10 MG tablet Take 1 tablet (10 mg total) by mouth daily. 05/14/18   Steele Sizer, MD  hydrochlorothiazide (HYDRODIURIL) 25 MG tablet Take 25 mg by mouth daily. 12/20/19   [provider]  losartan (COZAAR) 100 MG tablet TAKE 1 TABLET BY MOUTH ONCE DAILY 07/09/18   Steele Sizer, MD  metFORMIN (GLUCOPHAGE) 1000 MG tablet Take 1,000 mg by mouth daily. 12/06/19   [provider]  Multiple Vitamins-Minerals (MULTIVITAMIN WITH MINERALS) tablet Take by mouth.    [provider]  sildenafil (VIAGRA) 100 MG tablet Take by mouth. 10/08/19 10/07/20  [provider]  solifenacin (VESICARE) 5 MG tablet Take 5 mg by mouth daily. 01/03/20   [provider]  valsartan (DIOVAN) 160 MG tablet Take 160 mg by mouth daily.  12/27/19   [provider]  valsartan-hydrochlorothiazide (DIOVAN HCT) 160-25 MG tablet Take 1 tablet by mouth daily. 05/14/18   Steele Sizer, MD  vitamin C (ASCORBIC ACID) 500 MG tablet Take 500 mg by mouth.    [provider]    Allergies as of 05/15/2021 - Review Complete 05/15/2021  Allergen Reaction Noted   Simvastatin Other (See Comments) 09/04/2016    Family History  Problem Relation Age of Onset   Heart failure Mother    Diabetes Mother    Hypertension Mother    Hyperlipidemia Mother    Cancer Father        Brain   Diabetes Sister    Heart disease Brother        Heart Attack - was on drugs    Social History   Socioeconomic History   Marital status: Married    Spouse name: Benard Halsted   Number of children: 1   Years of education: Not on file   Highest education level: Doctorate  Occupational History   Occupation: Chief Executive Officer  Tobacco Use   Smoking status: Some Days    Packs/day: 0.20    Years: 50.00    Pack years: 10.00    Types: Pipe, Cigarettes    Start date: 09/04/1966   Smokeless tobacco: Never  Vaping Use   Vaping Use: Never used  Substance and Sexual Activity   Alcohol use: Yes    Alcohol/week: 3.0 standard drinks  Types: 3 Shots of liquor per week   Drug use: No   Sexual activity: Yes    Partners: Female  Other Topics Concern   Not on file  Social History Narrative   Patient is a Event organiser but focuses on immigration, 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   Social Determinants of Health   Financial Resource Strain: Not on file  Food Insecurity: Not on file  Transportation Needs: Not on file  Physical Activity: Not on file  Stress: Not on file  Social Connections: Not on file  Intimate Partner Violence: Not on file    Review of Systems: See HPI, otherwise negative ROS  Physical Exam: BP (!) 148/84   Pulse 68   Temp (!) 96.7 F (35.9 C) (Temporal)   Resp 18   Ht '5\' 9"'$   (1.753 m)   Wt 83.9 kg   SpO2 100%   BMI 27.32 kg/m  General:   Alert,  pleasant and cooperative in NAD Head:  Normocephalic and atraumatic. Neck:  Supple; no masses or thyromegaly. Lungs:  Clear throughout to auscultation.    Heart:  Regular rate and rhythm. Abdomen:  Soft, nontender and nondistended. Normal bowel sounds, without guarding, and without rebound.   Neurologic:  Alert and  oriented x4;  grossly normal neurologically.  Impression/Plan: Daniel Fuentes is here for an colonoscopy to be performed for colon cancer screening  Risks, benefits, limitations, and alternatives regarding  colonoscopy have been reviewed with the patient.  Questions have been answered.  All parties agreeable.   Sherri Sear, MD  05/30/2021, 9:57 AM

## 2021-05-30 NOTE — Anesthesia Postprocedure Evaluation (Signed)
Anesthesia Post Note  Patient: Daniel Fuentes  Procedure(s) Performed: COLONOSCOPY WITH PROPOFOL  Patient location during evaluation: Endoscopy Anesthesia Type: General Level of consciousness: awake and alert Pain management: pain level controlled Vital Signs Assessment: post-procedure vital signs reviewed and stable Respiratory status: spontaneous breathing, nonlabored ventilation, respiratory function stable and patient connected to nasal cannula oxygen Cardiovascular status: blood pressure returned to baseline and stable Postop Assessment: no apparent nausea or vomiting Anesthetic complications: no   No notable events documented.   Last Vitals:  Vitals:   05/30/21 0947 05/30/21 1117  BP: (!) 148/84 127/69  Pulse: 68 (!) 56  Resp: 18 13  Temp: (!) 35.9 C   SpO2: 100% 98%    Last Pain:  Vitals:   05/30/21 1137  TempSrc:   PainSc: 0-No pain                 Daniel Fuentes

## 2021-05-30 NOTE — Anesthesia Procedure Notes (Signed)
Date/Time: 05/30/2021 10:39 AM Performed by: Johnna Acosta, CRNA Pre-anesthesia Checklist: Patient identified, Emergency Drugs available, Suction available, Patient being monitored and Timeout performed Patient Re-evaluated:Patient Re-evaluated prior to induction Oxygen Delivery Method: Nasal cannula Preoxygenation: Pre-oxygenation with 100% oxygen Induction Type: IV induction

## 2021-05-30 NOTE — Transfer of Care (Signed)
Immediate Anesthesia Transfer of Care Note  Patient: Daniel Fuentes  Procedure(s) Performed: COLONOSCOPY WITH PROPOFOL  Patient Location: PACU  Anesthesia Type:General  Level of Consciousness: sedated  Airway & Oxygen Therapy: Patient Spontanous Breathing  Post-op Assessment: Report given to RN and Post -op Vital signs reviewed and stable  Post vital signs: Reviewed and stable  Last Vitals:  Vitals Value Taken Time  BP 127/69 05/30/21 1117  Temp    Pulse 56 05/30/21 1117  Resp 13 05/30/21 1117  SpO2 98 % 05/30/21 1117    Last Pain:  Vitals:   05/30/21 0947  TempSrc: Temporal  PainSc: 0-No pain         Complications: No notable events documented.

## 2021-05-31 ENCOUNTER — Encounter: Payer: Self-pay | Admitting: Gastroenterology

## 2021-05-31 LAB — SURGICAL PATHOLOGY

## 2021-06-02 ENCOUNTER — Encounter: Payer: Self-pay | Admitting: Gastroenterology

## 2022-02-08 ENCOUNTER — Inpatient Hospital Stay
Admission: EM | Admit: 2022-02-08 | Discharge: 2022-02-12 | DRG: 246 | Disposition: A | Discharge status: Home / Self Care | Payer: Medicare Other | Attending: Internal Medicine | Admitting: Internal Medicine

## 2022-02-08 ENCOUNTER — Encounter
Admission: EM | Disposition: A | Discharge status: Home / Self Care | Payer: Self-pay | Source: Home / Self Care | Attending: Internal Medicine

## 2022-02-08 ENCOUNTER — Other Ambulatory Visit: Payer: Self-pay

## 2022-02-08 ENCOUNTER — Emergency Department: Payer: Medicare Other

## 2022-02-08 ENCOUNTER — Encounter: Payer: Self-pay | Admitting: Nurse Practitioner

## 2022-02-08 DIAGNOSIS — I959 Hypotension, unspecified: Secondary | ICD-10-CM | POA: Diagnosis present

## 2022-02-08 DIAGNOSIS — E876 Hypokalemia: Secondary | ICD-10-CM

## 2022-02-08 DIAGNOSIS — I208 Other forms of angina pectoris: Secondary | ICD-10-CM

## 2022-02-08 DIAGNOSIS — I10 Essential (primary) hypertension: Secondary | ICD-10-CM | POA: Diagnosis not present

## 2022-02-08 DIAGNOSIS — Z8249 Family history of ischemic heart disease and other diseases of the circulatory system: Secondary | ICD-10-CM

## 2022-02-08 DIAGNOSIS — I472 Ventricular tachycardia, unspecified: Secondary | ICD-10-CM | POA: Diagnosis present

## 2022-02-08 DIAGNOSIS — I214 Non-ST elevation (NSTEMI) myocardial infarction: Secondary | ICD-10-CM | POA: Diagnosis present

## 2022-02-08 DIAGNOSIS — F17290 Nicotine dependence, other tobacco product, uncomplicated: Secondary | ICD-10-CM | POA: Diagnosis present

## 2022-02-08 DIAGNOSIS — N179 Acute kidney failure, unspecified: Secondary | ICD-10-CM | POA: Diagnosis present

## 2022-02-08 DIAGNOSIS — I714 Abdominal aortic aneurysm, without rupture, unspecified: Secondary | ICD-10-CM | POA: Diagnosis present

## 2022-02-08 DIAGNOSIS — F17210 Nicotine dependence, cigarettes, uncomplicated: Secondary | ICD-10-CM | POA: Diagnosis present

## 2022-02-08 DIAGNOSIS — R079 Chest pain, unspecified: Secondary | ICD-10-CM | POA: Diagnosis not present

## 2022-02-08 DIAGNOSIS — Z7984 Long term (current) use of oral hypoglycemic drugs: Secondary | ICD-10-CM

## 2022-02-08 DIAGNOSIS — J439 Emphysema, unspecified: Secondary | ICD-10-CM | POA: Diagnosis present

## 2022-02-08 DIAGNOSIS — Z888 Allergy status to other drugs, medicaments and biological substances status: Secondary | ICD-10-CM | POA: Diagnosis not present

## 2022-02-08 DIAGNOSIS — Z7982 Long term (current) use of aspirin: Secondary | ICD-10-CM | POA: Diagnosis not present

## 2022-02-08 DIAGNOSIS — I2582 Chronic total occlusion of coronary artery: Secondary | ICD-10-CM | POA: Diagnosis present

## 2022-02-08 DIAGNOSIS — Z83438 Family history of other disorder of lipoprotein metabolism and other lipidemia: Secondary | ICD-10-CM | POA: Diagnosis not present

## 2022-02-08 DIAGNOSIS — N182 Chronic kidney disease, stage 2 (mild): Secondary | ICD-10-CM | POA: Diagnosis present

## 2022-02-08 DIAGNOSIS — Z79899 Other long term (current) drug therapy: Secondary | ICD-10-CM

## 2022-02-08 DIAGNOSIS — Z833 Family history of diabetes mellitus: Secondary | ICD-10-CM | POA: Diagnosis not present

## 2022-02-08 DIAGNOSIS — I2510 Atherosclerotic heart disease of native coronary artery without angina pectoris: Secondary | ICD-10-CM | POA: Diagnosis present

## 2022-02-08 DIAGNOSIS — E782 Mixed hyperlipidemia: Secondary | ICD-10-CM | POA: Diagnosis present

## 2022-02-08 DIAGNOSIS — E1122 Type 2 diabetes mellitus with diabetic chronic kidney disease: Secondary | ICD-10-CM | POA: Diagnosis present

## 2022-02-08 DIAGNOSIS — I255 Ischemic cardiomyopathy: Secondary | ICD-10-CM | POA: Diagnosis present

## 2022-02-08 DIAGNOSIS — I129 Hypertensive chronic kidney disease with stage 1 through stage 4 chronic kidney disease, or unspecified chronic kidney disease: Secondary | ICD-10-CM | POA: Diagnosis present

## 2022-02-08 DIAGNOSIS — E1121 Type 2 diabetes mellitus with diabetic nephropathy: Secondary | ICD-10-CM | POA: Diagnosis not present

## 2022-02-08 DIAGNOSIS — E785 Hyperlipidemia, unspecified: Secondary | ICD-10-CM | POA: Diagnosis not present

## 2022-02-08 DIAGNOSIS — F17200 Nicotine dependence, unspecified, uncomplicated: Secondary | ICD-10-CM | POA: Diagnosis present

## 2022-02-08 DIAGNOSIS — I2584 Coronary atherosclerosis due to calcified coronary lesion: Secondary | ICD-10-CM | POA: Diagnosis present

## 2022-02-08 DIAGNOSIS — Z955 Presence of coronary angioplasty implant and graft: Secondary | ICD-10-CM | POA: Diagnosis not present

## 2022-02-08 HISTORY — DX: Chronic kidney disease, stage 2 (mild): N18.2

## 2022-02-08 HISTORY — PX: LEFT HEART CATH AND CORONARY ANGIOGRAPHY: CATH118249

## 2022-02-08 HISTORY — PX: CORONARY STENT INTERVENTION: CATH118234

## 2022-02-08 LAB — CBC WITH DIFFERENTIAL/PLATELET
Abs Immature Granulocytes: 0.01 10*3/uL (ref 0.00–0.07)
Basophils Absolute: 0 10*3/uL (ref 0.0–0.1)
Basophils Relative: 0 %
Eosinophils Absolute: 0.1 10*3/uL (ref 0.0–0.5)
Eosinophils Relative: 1 %
HCT: 40.9 % (ref 39.0–52.0)
Hemoglobin: 13.4 g/dL (ref 13.0–17.0)
Immature Granulocytes: 0 %
Lymphocytes Relative: 55 %
Lymphs Abs: 3.6 10*3/uL (ref 0.7–4.0)
MCH: 29.3 pg (ref 26.0–34.0)
MCHC: 32.8 g/dL (ref 30.0–36.0)
MCV: 89.3 fL (ref 80.0–100.0)
Monocytes Absolute: 0.6 10*3/uL (ref 0.1–1.0)
Monocytes Relative: 9 %
Neutro Abs: 2.3 10*3/uL (ref 1.7–7.7)
Neutrophils Relative %: 35 %
Platelets: 286 10*3/uL (ref 150–400)
RBC: 4.58 MIL/uL (ref 4.22–5.81)
RDW: 14.6 % (ref 11.5–15.5)
WBC: 6.6 10*3/uL (ref 4.0–10.5)
nRBC: 0 % (ref 0.0–0.2)

## 2022-02-08 LAB — POCT ACTIVATED CLOTTING TIME
Activated Clotting Time: 233 seconds
Activated Clotting Time: 269 seconds
Activated Clotting Time: 263 seconds
Activated Clotting Time: 269 seconds

## 2022-02-08 LAB — LIPID PANEL
Cholesterol: 181 mg/dL (ref 0–200)
HDL: 48 mg/dL (ref >40)
LDL Cholesterol: 110 mg/dL — ABNORMAL HIGH (ref 0–99)
Total CHOL/HDL Ratio: 3.8 RATIO
Triglycerides: 114 mg/dL (ref <150)
VLDL: 23 mg/dL (ref 0–40)

## 2022-02-08 LAB — BRAIN NATRIURETIC PEPTIDE: B Natriuretic Peptide: 137.6 pg/mL — ABNORMAL HIGH (ref 0.0–100.0)

## 2022-02-08 LAB — COMPREHENSIVE METABOLIC PANEL
ALT: 13 U/L (ref 0–44)
AST: 43 U/L — ABNORMAL HIGH (ref 15–41)
Albumin: 3.6 g/dL (ref 3.5–5.0)
Alkaline Phosphatase: 57 U/L (ref 38–126)
Anion gap: 9 (ref 5–15)
BUN: 19 mg/dL (ref 8–23)
CO2: 23 mmol/L (ref 22–32)
Calcium: 9 mg/dL (ref 8.9–10.3)
Chloride: 106 mmol/L (ref 98–111)
Creatinine, Ser: 1.31 mg/dL — ABNORMAL HIGH (ref 0.61–1.24)
GFR, Estimated: 58 mL/min — ABNORMAL LOW (ref >60)
Glucose, Bld: 178 mg/dL — ABNORMAL HIGH (ref 70–99)
Potassium: 3.5 mmol/L (ref 3.5–5.1)
Sodium: 138 mmol/L (ref 135–145)
Total Bilirubin: 0.8 mg/dL (ref 0.3–1.2)
Total Protein: 6.7 g/dL (ref 6.5–8.1)

## 2022-02-08 LAB — TROPONIN I (HIGH SENSITIVITY)
Troponin I (High Sensitivity): 966 ng/L (ref <18)
Troponin I (High Sensitivity): 18478 ng/L (ref <18)

## 2022-02-08 LAB — GLUCOSE, CAPILLARY: Glucose-Capillary: 126 mg/dL — ABNORMAL HIGH (ref 70–99)

## 2022-02-08 LAB — PROTIME-INR
INR: 1 (ref 0.8–1.2)
Prothrombin Time: 13.4 seconds (ref 11.4–15.2)

## 2022-02-08 LAB — CBG MONITORING, ED: Glucose-Capillary: 185 mg/dL — ABNORMAL HIGH (ref 70–99)

## 2022-02-08 LAB — LACTIC ACID, PLASMA
Lactic Acid, Venous: 1.8 mmol/L (ref 0.5–1.9)
Lactic Acid, Venous: 1.2 mmol/L (ref 0.5–1.9)

## 2022-02-08 LAB — HEPARIN LEVEL (UNFRACTIONATED): Heparin Unfractionated: 0.57 IU/mL (ref 0.30–0.70)

## 2022-02-08 LAB — APTT: aPTT: 22 seconds — ABNORMAL LOW (ref 24–36)

## 2022-02-08 SURGERY — LEFT HEART CATH AND CORONARY ANGIOGRAPHY
Anesthesia: Moderate Sedation

## 2022-02-08 MED ORDER — SODIUM CHLORIDE 0.9% FLUSH: 3.0000 mL | INTRAVENOUS | Status: DC | PRN

## 2022-02-08 MED ORDER — MIDAZOLAM HCL 2 MG/2ML IJ SOLN
INTRAMUSCULAR | Status: DC | PRN
  Administered 2022-02-08 (×2): 1 mg via INTRAVENOUS

## 2022-02-08 MED ORDER — DM-GUAIFENESIN ER 30-600 MG PO TB12: 1.0000 | ORAL_TABLET | Freq: Two times a day (BID) | ORAL | Status: DC | PRN

## 2022-02-08 MED ORDER — METOPROLOL TARTRATE 5 MG/5ML IV SOLN
5.0000 mg | INTRAVENOUS | Status: DC | PRN
Start: 2022-02-08 — End: 2022-02-12

## 2022-02-08 MED ORDER — SODIUM CHLORIDE 0.9 % IV SOLN
1000.0000 mL | Freq: Once | INTRAVENOUS | Status: AC
  Administered 2022-02-08: 1000 mL via INTRAVENOUS

## 2022-02-08 MED ORDER — OXYCODONE HCL 5 MG PO TABS: 5.0000 mg | ORAL_TABLET | ORAL | Status: DC | PRN

## 2022-02-08 MED ORDER — HEPARIN (PORCINE) IN NACL 1000-0.9 UT/500ML-% IV SOLN
INTRAVENOUS | Status: AC
  Filled 2022-02-08: qty 1000

## 2022-02-08 MED ORDER — HEPARIN (PORCINE) 25000 UT/250ML-% IV SOLN: 1200.0000 [IU]/h | INTRAVENOUS | Status: DC

## 2022-02-08 MED ORDER — CLOPIDOGREL BISULFATE 75 MG PO TABS
ORAL_TABLET | ORAL | Status: AC
  Filled 2022-02-08: qty 8

## 2022-02-08 MED ORDER — SODIUM CHLORIDE 0.9 % IV SOLN: INTRAVENOUS | Status: AC

## 2022-02-08 MED ORDER — SODIUM CHLORIDE 0.9 % IV SOLN
INTRAVENOUS | Status: DC
Start: 2022-02-08 — End: 2022-02-08
  Administered 2022-02-08: 1000 mL via INTRAVENOUS

## 2022-02-08 MED ORDER — ACETAMINOPHEN 325 MG PO TABS: 650.0000 mg | ORAL_TABLET | Freq: Four times a day (QID) | ORAL | Status: DC | PRN

## 2022-02-08 MED ORDER — HEPARIN SODIUM (PORCINE) 1000 UNIT/ML IJ SOLN
INTRAMUSCULAR | Status: AC
  Filled 2022-02-08: qty 10

## 2022-02-08 MED ORDER — ASPIRIN 81 MG PO CHEW
324.0000 mg | CHEWABLE_TABLET | Freq: Once | ORAL | Status: AC
  Administered 2022-02-08: 324 mg via ORAL
  Filled 2022-02-08: qty 4

## 2022-02-08 MED ORDER — AMLODIPINE BESYLATE 5 MG PO TABS
5.0000 mg | ORAL_TABLET | Freq: Every day | ORAL | Status: DC
  Administered 2022-02-09: 5 mg via ORAL
  Filled 2022-02-08: qty 1

## 2022-02-08 MED ORDER — NITROGLYCERIN 1 MG/10 ML FOR IR/CATH LAB
INTRA_ARTERIAL | Status: DC | PRN
  Administered 2022-02-08 (×2): 200 ug via INTRACORONARY

## 2022-02-08 MED ORDER — IOHEXOL 300 MG/ML  SOLN
INTRAMUSCULAR | Status: DC | PRN
  Administered 2022-02-08: 176 mL

## 2022-02-08 MED ORDER — SENNOSIDES-DOCUSATE SODIUM 8.6-50 MG PO TABS
1.0000 | ORAL_TABLET | Freq: Every evening | ORAL | Status: DC | PRN
Start: 2022-02-08 — End: 2022-02-12

## 2022-02-08 MED ORDER — MIDAZOLAM HCL 2 MG/2ML IJ SOLN
INTRAMUSCULAR | Status: AC
  Filled 2022-02-08: qty 2

## 2022-02-08 MED ORDER — SODIUM CHLORIDE 0.9 % WEIGHT BASED INFUSION: 3.0000 mL/kg/h | INTRAVENOUS | Status: DC

## 2022-02-08 MED ORDER — ASPIRIN 81 MG PO CHEW: 81.0000 mg | CHEWABLE_TABLET | ORAL | Status: DC

## 2022-02-08 MED ORDER — CLOPIDOGREL BISULFATE 75 MG PO TABS
75.0000 mg | ORAL_TABLET | Freq: Every day | ORAL | Status: DC
  Administered 2022-02-09 – 2022-02-12 (×3): 75 mg via ORAL
  Filled 2022-02-08 (×3): qty 1

## 2022-02-08 MED ORDER — VERAPAMIL HCL 2.5 MG/ML IV SOLN
INTRAVENOUS | Status: DC | PRN
  Administered 2022-02-08: 2.5 mg via INTRA_ARTERIAL

## 2022-02-08 MED ORDER — FENTANYL CITRATE (PF) 100 MCG/2ML IJ SOLN
INTRAMUSCULAR | Status: AC
  Filled 2022-02-08: qty 2

## 2022-02-08 MED ORDER — ASPIRIN 81 MG PO CHEW
81.0000 mg | CHEWABLE_TABLET | Freq: Every day | ORAL | Status: DC
  Administered 2022-02-09 – 2022-02-12 (×4): 81 mg via ORAL
  Filled 2022-02-08 (×3): qty 1

## 2022-02-08 MED ORDER — NITROGLYCERIN 0.4 MG SL SUBL: 0.4000 mg | SUBLINGUAL_TABLET | SUBLINGUAL | Status: DC | PRN

## 2022-02-08 MED ORDER — FENTANYL CITRATE (PF) 100 MCG/2ML IJ SOLN
INTRAMUSCULAR | Status: DC | PRN
  Administered 2022-02-08 (×2): 25 ug via INTRAVENOUS

## 2022-02-08 MED ORDER — SODIUM CHLORIDE 0.9 % WEIGHT BASED INFUSION: 1.0000 mL/kg/h | INTRAVENOUS | Status: DC

## 2022-02-08 MED ORDER — LIDOCAINE HCL 1 % IJ SOLN
INTRAMUSCULAR | Status: AC
  Filled 2022-02-08: qty 20

## 2022-02-08 MED ORDER — SODIUM CHLORIDE 0.9 % IV SOLN: 250.0000 mL | INTRAVENOUS | Status: DC | PRN

## 2022-02-08 MED ORDER — TRAZODONE HCL 50 MG PO TABS
50.0000 mg | ORAL_TABLET | Freq: Every evening | ORAL | Status: DC | PRN
  Administered 2022-02-09 – 2022-02-11 (×4): 50 mg via ORAL
  Filled 2022-02-08 (×4): qty 1

## 2022-02-08 MED ORDER — HYDRALAZINE HCL 20 MG/ML IJ SOLN
10.0000 mg | INTRAMUSCULAR | Status: DC | PRN
Start: 2022-02-08 — End: 2022-02-12
  Administered 2022-02-09 – 2022-02-11 (×2): 10 mg via INTRAVENOUS
  Filled 2022-02-08: qty 1

## 2022-02-08 MED ORDER — SODIUM CHLORIDE 0.9% FLUSH
3.0000 mL | Freq: Two times a day (BID) | INTRAVENOUS | Status: DC
  Administered 2022-02-08 – 2022-02-10 (×3): 3 mL via INTRAVENOUS

## 2022-02-08 MED ORDER — IPRATROPIUM-ALBUTEROL 0.5-2.5 (3) MG/3ML IN SOLN
3.0000 mL | RESPIRATORY_TRACT | Status: DC | PRN
Start: 2022-02-08 — End: 2022-02-12

## 2022-02-08 MED ORDER — SODIUM CHLORIDE 0.9% FLUSH
3.0000 mL | Freq: Two times a day (BID) | INTRAVENOUS | Status: DC
  Administered 2022-02-09 – 2022-02-10 (×3): 3 mL via INTRAVENOUS

## 2022-02-08 MED ORDER — LIDOCAINE HCL (PF) 1 % IJ SOLN
INTRAMUSCULAR | Status: DC | PRN
  Administered 2022-02-08: 2 mL

## 2022-02-08 MED ORDER — EZETIMIBE 10 MG PO TABS
10.0000 mg | ORAL_TABLET | Freq: Every day | ORAL | Status: DC
  Administered 2022-02-09 – 2022-02-12 (×3): 10 mg via ORAL
  Filled 2022-02-08 (×3): qty 1

## 2022-02-08 MED ORDER — VERAPAMIL HCL 2.5 MG/ML IV SOLN
INTRAVENOUS | Status: AC
  Filled 2022-02-08: qty 2

## 2022-02-08 MED ORDER — HEPARIN SODIUM (PORCINE) 1000 UNIT/ML IJ SOLN
INTRAMUSCULAR | Status: DC | PRN
Start: 2022-02-08 — End: 2022-02-08
  Administered 2022-02-08 (×3): 2000 [IU] via INTRAVENOUS
  Administered 2022-02-08: 4500 [IU] via INTRAVENOUS
  Administered 2022-02-08: 3000 [IU] via INTRAVENOUS
  Administered 2022-02-08: 4500 [IU] via INTRAVENOUS

## 2022-02-08 MED ORDER — HEPARIN (PORCINE) IN NACL 1000-0.9 UT/500ML-% IV SOLN
INTRAVENOUS | Status: DC | PRN
  Administered 2022-02-08: 1000 mL

## 2022-02-08 MED ORDER — HEPARIN (PORCINE) 25000 UT/250ML-% IV SOLN
1500.0000 [IU]/h | INTRAVENOUS | Status: DC
  Administered 2022-02-08: 1200 [IU]/h via INTRAVENOUS
  Administered 2022-02-09 – 2022-02-11 (×3): 1500 [IU]/h via INTRAVENOUS
  Filled 2022-02-08 (×4): qty 250

## 2022-02-08 MED ORDER — CLOPIDOGREL BISULFATE 75 MG PO TABS
ORAL_TABLET | ORAL | Status: DC | PRN
  Administered 2022-02-08: 600 mg via ORAL

## 2022-02-08 MED ORDER — HEPARIN BOLUS VIA INFUSION
4000.0000 [IU] | Freq: Once | INTRAVENOUS | Status: DC
  Filled 2022-02-08: qty 4000

## 2022-02-08 MED ORDER — HYDRALAZINE HCL 20 MG/ML IJ SOLN: 10.0000 mg | INTRAMUSCULAR | Status: AC | PRN

## 2022-02-08 SURGICAL SUPPLY — 28 items
BALLN MINITREK RX 2.0X12 (BALLOONS) ×2
BALLN TREK RX 2.5X15 (BALLOONS) ×2
BALLN ~~LOC~~ TREK RX 2.25X8 (BALLOONS) ×2
BALLN ~~LOC~~ TREK RX 3.5X20 (BALLOONS) ×2
BALLN ~~LOC~~ TREK RX 3.5X8 (BALLOONS) ×2
BALLOON MINITREK RX 2.0X12 (BALLOONS) IMPLANT
BALLOON TREK RX 2.5X15 (BALLOONS) IMPLANT
BALLOON ~~LOC~~ TREK RX 2.25X8 (BALLOONS) IMPLANT
BALLOON ~~LOC~~ TREK RX 3.5X20 (BALLOONS) IMPLANT
BALLOON ~~LOC~~ TREK RX 3.5X8 (BALLOONS) IMPLANT
CATH 5FR JL3.5 JR4 ANG PIG MP (CATHETERS) ×1 IMPLANT
CATH LAUNCHER 6FR AL.75 (CATHETERS) ×1 IMPLANT
CATH LAUNCHER 6FR JR4 (CATHETERS) ×1 IMPLANT
DEVICE RAD TR BAND REGULAR (VASCULAR PRODUCTS) ×1 IMPLANT
DRAPE BRACHIAL (DRAPES) ×1 IMPLANT
GLIDESHEATH SLEND SS 6F .021 (SHEATH) ×1 IMPLANT
GUIDEWIRE INQWIRE 1.5J.035X260 (WIRE) IMPLANT
INQWIRE 1.5J .035X260CM (WIRE) ×2
KIT ENCORE 26 ADVANTAGE (KITS) ×1 IMPLANT
PACK CARDIAC CATH (CUSTOM PROCEDURE TRAY) ×2 IMPLANT
PROTECTION STATION PRESSURIZED (MISCELLANEOUS) ×2
SET ATX SIMPLICITY (MISCELLANEOUS) ×1 IMPLANT
STATION PROTECTION PRESSURIZED (MISCELLANEOUS) IMPLANT
STENT ONYX FRONTIER 2.0X12 (Permanent Stent) ×1 IMPLANT
STENT ONYX FRONTIER 3.0X38 (Permanent Stent) ×1 IMPLANT
STENT ONYX FRONTIER 3.5X08 (Permanent Stent) ×1 IMPLANT
STENT ONYX FRONTIER 3.5X12 (Permanent Stent) ×1 IMPLANT
WIRE RUNTHROUGH .014X180CM (WIRE) ×1 IMPLANT

## 2022-02-08 NOTE — Interval H&P Note (Signed)
History and Physical Interval Note: ? ?02/08/2022 ?12:32 PM ? ?Daniel Fuentes  has presented today for surgery, with the diagnosis of NSTEMI.  The various methods of treatment have been discussed with the patient and family. After consideration of risks, benefits and other options for treatment, the patient has consented to  Procedure(s): ?LEFT HEART CATH AND CORONARY ANGIOGRAPHY (N/A) as a surgical intervention.  The patient's history has been reviewed, patient examined, no change in status, stable for surgery.  I have reviewed the patient's chart and labs.  Questions were answered to the patient's satisfaction.   ? ?Cath Lab Visit (complete for each Cath Lab visit) ? ?Clinical Evaluation Leading to the Procedure:  ? ?ACS: Yes.   ? ?Non-ACS:  N/A ? ?Daniel Fuentes ? ? ?

## 2022-02-08 NOTE — ED Triage Notes (Signed)
Pt here with chest tightness and sob since yesterday. Pt also states some confusion and left arm tingling. Pt diaphoretic on arrival to ED. ?

## 2022-02-08 NOTE — ED Provider Notes (Signed)
? ?[facility] ?Provider Note ? ? ? Event Date/Time  ? First MD Initiated Contact with Patient 02/08/22 [phone]   ?  (approximate) ? ? ?History  ? ?Chest Pain ? ? ?HPI ? ?Daniel Fuentes is a 71 y.o. male with a history of CKD, diabetes, hypertension, hyperlipidemia, who does smoke cigarettes who presents with complaints of chest discomfort and lightheadedness.  Patient reports he has had intermittent chest discomfort over the last several days.  This morning he felt very lightheaded and did have moderate chest pressure.  He still has chest pressure.  He is diaphoretic. ?  ? ? ?Physical Exam  ? ?Triage Vital Signs: ?ED Triage Vitals  ?Enc Vitals Group  ?   BP 02/08/22 0905 (!) 92/56  ?   Pulse Rate 02/08/22 0905 69  ?   Resp 02/08/22 0905 18  ?   Temp 02/08/22 0905 97.7 ?F (36.5 ?C)  ?   Temp Source 02/08/22 0905 Oral  ?   SpO2 02/08/22 0905 96 %  ?   Weight 02/08/22 0903 89.4 kg (197 lb)  ?   Height 02/08/22 0903 1.753 m ('5\' 9"'$ )  ?   Head Circumference --   ?   Peak Flow --   ?   Pain Score 02/08/22 0903 3  ?   Pain Loc --   ?   Pain Edu? --   ?   Excl. in [place]? --   ? ? ?Most recent vital signs: ?Vitals:  ? 02/08/22 1015 02/08/22 1030  ?BP: 137/75 (!) 157/89  ?Pulse: (!) 54 60  ?Resp: 17 15  ?Temp:    ?SpO2: 99% 100%  ? ? ? ?General: Awake, diffusely diaphoretic ?CV:  Good peripheral perfusion.  Regular rate and rhythm ?Resp:  Normal effort.  ?Abd:  No distention.  ?Other:  No calf pain or swelling ? ? ?ED Results / Procedures / Treatments  ? ?Labs ?(all labs ordered are listed, but only abnormal results are displayed) ?Labs Reviewed  ?COMPREHENSIVE METABOLIC PANEL - Abnormal; Notable for the following components:  ?    Result Value  ? Glucose, Bld 178 (*)   ? Creatinine, Ser 1.31 (*)   ? AST 43 (*)   ? GFR, Estimated 58 (*)   ? All other components within normal limits  ?BRAIN NATRIURETIC PEPTIDE - Abnormal; Notable for the following components:  ? B Natriuretic Peptide 137.6 (*)   ? All other  components within normal limits  ?APTT - Abnormal; Notable for the following components:  ? aPTT 22 (*)   ? All other components within normal limits  ?CBG MONITORING, ED - Abnormal; Notable for the following components:  ? Glucose-Capillary 185 (*)   ? All other components within normal limits  ?TROPONIN I (HIGH SENSITIVITY) - Abnormal; Notable for the following components:  ? Troponin I (High Sensitivity) 966 (*)   ? All other components within normal limits  ?CULTURE, BLOOD (ROUTINE X 2)  ?CULTURE, BLOOD (ROUTINE X 2)  ?LACTIC ACID, PLASMA  ?CBC WITH DIFFERENTIAL/PLATELET  ?PROTIME-INR  ?LACTIC ACID, PLASMA  ?URINALYSIS, COMPLETE (UACMP) WITH MICROSCOPIC  ? ? ? ?EKG ? ?ED ECG REPORT ?I, Lavonia Drafts, the attending physician, personally viewed and interpreted this ECG. ? ?Date: 02/08/2022 ? ?Rhythm: normal sinus rhythm ?QRS Axis: normal ?Intervals: normal ?ST/T Wave abnormalities: T wave inversions inferiorly noted ?Narrative Interpretation: Concerning T waves, repeat obtained, discussed with Dr. Saunders Revel of cardiology, recommends medical work-up at this time ? ? ? ?RADIOLOGY ?Chest x-ray  interpreted by me, no acute abnormality ? ? ? ?PROCEDURES: ? ?Critical Care performed: yes ? ?CRITICAL CARE ?Performed by: Lavonia Drafts ? ? ?Total critical care time: 30 minutes ? ?Critical care time was exclusive of separately billable procedures and treating other patients. ? ?Critical care was necessary to treat or prevent imminent or life-threatening deterioration. ? ?Critical care was time spent personally by me on the following activities: development of treatment plan with patient and/or surrogate as well as nursing, discussions with consultants, evaluation of patient's response to treatment, examination of patient, obtaining history from patient or surrogate, ordering and performing treatments and interventions, ordering and review of laboratory studies, ordering and review of radiographic studies, pulse oximetry and  re-evaluation of patient's condition. ? ? ?Procedures ? ? ?MEDICATIONS ORDERED IN ED: ?Medications  ?aspirin chewable tablet 324 mg (has no administration in time range)  ?0.9 %  sodium chloride infusion (1,000 mLs Intravenous New Bag/Given 02/08/22 1053)  ? ? ? ?IMPRESSION / MDM / ASSESSMENT AND PLAN / ED COURSE  ?I reviewed the triage vital signs and the nursing notes. ? ?Patient presents with chest pressure, lightheadedness.  He is diaphoretic on exam borderline hypotensive ? ?Differential includes ACS, NSTEMI, STEMI, electrolyte abnormality, dehydration, sepsis, etc. ? ?Contacted Dr. Saunders Revel of cardiology to discuss EKG given patient's presentation, he feels not consistent with STEMI which I agree, will give IV fluids, work-up, pending enzymes ? ?After fluids patient appears much better, no longer diaphoretic, calm, talking on the phone ? ?Dr. Rockey Situ of cardiology has seen the patient in the emergency department, appreciate his assistance, recommends pending enzymes at this time ? ?Lab work has returned, notified of critical troponin of 966, CBC BMP otherwise reassuring ? ? ?Have discussed with Dr. Rockey Situ, recommend starting heparin/aspirin, he will arrange for urgent catheterization with Dr. Saunders Revel ? ?I have consulted the hospitalist for admission ? ? ? ? ? ?  ? ? ?FINAL CLINICAL IMPRESSION(S) / ED DIAGNOSES  ? ?Final diagnoses:  ?NSTEMI (non-ST elevated myocardial infarction) ([place])  ? ? ? ?Rx / DC Orders  ? ?ED Discharge Orders   ? ? None  ? ?  ? ? ? ?Note:  This document was prepared using Dragon voice recognition software and may include unintentional dictation errors. ?  ?Lavonia Drafts, MD ?02/08/22 1110 ? ?

## 2022-02-08 NOTE — H&P (View-Only) (Signed)
?Cardiology Consultation:  ? ?Patient ID: Daniel Fuentes ?MRN: [id number]; DOB: September 04, 1951 ? ?Admit date: 02/08/2022 ?Date of Consult: 02/08/2022 ? ?PCP:  Romualdo Bolk, FNP ?  ?[facility] ?Cardiologist:  Ida Rogue, MD  ?Patient Profile:  ? ?[name] is a 71 y.o. male with a hx of essential HTN, HLD, DM, CKD, previous surgery for subdural hematoma, abdominal aortic aneurysm 3.0 cm in 2018, former smoker who is being seen 02/08/2022 for the evaluation of chest pain and abnormal EKG at the request of Dr. Corky Downs. ? ?History of Present Illness:  ? ?Mr. Paver stated that last evening after dinner he had noted some chest discomfort, epigastric, without radiation , and thought it was indigestion since he had eaten smothered liver for dinner. Of note he did state that he had push mowed the lawn on yesterday for about an hour and did not have any of the chest discomfort/chest tightness with that exertion. He had woken the this morning and stated that he just didn't feel great but the chest discomfort had resolved and he got ready and went to work at his office in [place]. Once he arrived the elevator was non-functioning and he had to walk up the stairs to the third floor to get to his office. He did state the building is an old building and he had to travel up 20 plus stairs each flight. He noted an increased amount of shortness of breath when walking upstairs. After being at work he started to have the chest discomfort/tightness again and had episodes of dizziness that started associated with nausea and diarrhea, which he noted was unusual for him. While sitting on the commode he felt as though he would pass out and became extremely diaphoretic. Since his symptoms were not improving he drove himself to the [facility] emergency department.  ? ?On arrival to the emergency department he was diaphoretic with blood pressures in the 44'H systolic and with chest tightness, some confusion, and left arm tingling. EKG  revealed new RBBB. Creatinine slightly elevated at 1.31, BNP 137.6, and HS troponin 966.  ? ? ?Past Medical History:  ?Diagnosis Date  ? CKD (chronic kidney disease), stage II   ? Diabetes mellitus without complication ([place])   ? Hyperlipidemia   ? Hypertension   ? ? ?Past Surgical History:  ?Procedure Laterality Date  ? BRAIN SURGERY  2017  ? Subdural Hematoma  ? COLONOSCOPY WITH PROPOFOL N/A 05/30/2021  ? Procedure: COLONOSCOPY WITH PROPOFOL;  Surgeon: Lin Landsman, MD;  Location: [facility] ENDOSCOPY;  Service: Gastroenterology;  Laterality: N/A;  ?  ? ?Home Medications:  ?Prior to Admission medications   ?Medication Sig Start Date End Date Taking? Authorizing Provider  ?Alirocumab 150 MG/ML SOAJ Inject into the skin. 12/24/19   [provider]  ?amLODipine (NORVASC) 5 MG tablet Take 5 mg by mouth daily. 11/24/19   [provider]  ?aspirin EC 81 MG tablet Take 1 tablet (81 mg total) by mouth daily. 06/10/17   Hubbard Hartshorn, FNP  ?Cholecalciferol (VITAMIN D3) 2000 units capsule Take by mouth.    [provider]  ?DOCOSAHEXAENOIC ACID PO Take by mouth.    [provider]  ?ezetimibe (ZETIA) 10 MG tablet Take 1 tablet (10 mg total) by mouth daily. ?Patient not taking: Reported on 05/30/2021 05/14/18   Steele Sizer, MD  ?hydrochlorothiazide (HYDRODIURIL) 25 MG tablet Take 25 mg by mouth daily. 12/20/19   [provider]  ?losartan (COZAAR) 100 MG tablet TAKE 1 TABLET BY  MOUTH ONCE DAILY 07/09/18   Steele Sizer, MD  ?metFORMIN (GLUCOPHAGE) 1000 MG tablet Take 1,000 mg by mouth daily. 12/06/19   [provider]  ?Multiple Vitamins-Minerals (MULTIVITAMIN WITH MINERALS) tablet Take by mouth.    [provider]  ?sildenafil (VIAGRA) 100 MG tablet Take by mouth. 10/08/19 10/07/20  [provider]  ?solifenacin (VESICARE) 5 MG tablet Take 5 mg by mouth daily. 01/03/20   [provider]  ?valsartan (DIOVAN) 160 MG tablet Take 160 mg by mouth daily.  12/27/19   [provider]  ?valsartan-hydrochlorothiazide (DIOVAN HCT) 160-25 MG tablet Take 1 tablet by mouth daily. 05/14/18   Steele Sizer, MD  ?vitamin C (ASCORBIC ACID) 500 MG tablet Take 500 mg by mouth.    [provider]  ? ? ?Inpatient Medications: ?Scheduled Meds: ? [START ON 02/09/2022] amLODipine  5 mg Oral Daily  ? [START ON 02/09/2022] aspirin  81 mg Oral Pre-Cath  ? ezetimibe  10 mg Oral Daily  ? heparin  4,000 Units Intravenous Once  ? sodium chloride flush  3 mL Intravenous Q12H  ? ?Continuous Infusions: ? sodium chloride    ? sodium chloride    ? [START ON 02/09/2022] sodium chloride    ? Followed by  ? [START ON 02/09/2022] sodium chloride    ? heparin    ? ?PRN Meds: ? ? ?Allergies:    ?Allergies  ?Allergen Reactions  ? Simvastatin Other (See Comments)  ?  Muscle aches  ? Statins Other (See Comments)  ?  Joint pain   ? ? ?Social History:   ?Social History  ? ?Socioeconomic History  ? Marital status: Married  ?  Spouse name: Benard Fuentes  ? Number of children: 1  ? Years of education: Not on file  ? Highest education level: Doctorate  ?Occupational History  ? Occupation: Chief Executive Officer  ?Tobacco Use  ? Smoking status: Some Days  ?  Packs/day: 0.20  ?  Years: 50.00  ?  Pack years: 10.00  ?  Types: Pipe, Cigarettes  ?  Start date: 09/04/1966  ? Smokeless tobacco: Never  ?Vaping Use  ? Vaping Use: Never used  ?Substance and Sexual Activity  ? Alcohol use: Yes  ?  Alcohol/week: 3.0 standard drinks  ?  Types: 3 Shots of liquor per week  ? Drug use: No  ? Sexual activity: Yes  ?  Partners: Female  ?Other Topics Concern  ? Not on file  ?Social History Narrative  ? 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  ? ?Social Determinants of Health  ? ?Financial Resource Strain: Not on file  ?Food Insecurity: Not on file  ?Transportation Needs: Not on file  ?Physical Activity: Not on file  ?Stress: Not on file   ?Social Connections: Not on file  ?Intimate Partner Violence: Not on file  ?  ?Family History:   ?Family History  ?Problem Relation Age of Onset  ? Heart failure Mother   ? Diabetes Mother   ? Hypertension Mother   ? Hyperlipidemia Mother   ? Cancer Father   ?     Brain  ? Diabetes Sister   ? Heart disease Brother   ?     Heart Attack - was on drugs  ?  ? ?ROS:  ?Please see the history of present illness.  ?Review of Systems  ?Constitutional: Positive for diaphoresis.  ?HENT: Negative.    ?Eyes: Negative.   ?  Cardiovascular:  Positive for chest pain and dyspnea on exertion.  ?Respiratory:  Positive for shortness of breath.   ?Endocrine: Negative.   ?Hematologic/Lymphatic: Negative.   ?Skin:   ?     Has what he thinks is a bug bite to the left side of his head  ?Musculoskeletal:  Positive for muscle weakness.  ?Gastrointestinal:  Positive for abdominal pain and diarrhea.  ?Genitourinary: Negative.   ?Neurological:  Positive for light-headedness and weakness.  ?Psychiatric/Behavioral: Negative.    ?Allergic/Immunologic: Negative.   ? ?All other ROS reviewed and negative.    ? ?Physical Exam/Data:  ? ?Vitals:  ? 02/08/22 1030 02/08/22 1045 02/08/22 1100 02/08/22 1115  ?BP: (!) 157/89 (!) 159/94 (!) 147/77 (!) 149/85  ?Pulse: 60 65 (!) 55 61  ?Resp: '15 16 17 14  '$ ?Temp:      ?TempSrc:      ?SpO2: 100% 100% 100% 100%  ?Weight:      ?Height:      ? ?No intake or output data in the 24 hours ending 02/08/22 1150 ? ?  02/08/2022  ?  9:03 AM 05/30/2021  ?  9:47 AM 05/14/2018  ? 10:01 AM  ?Last 3 Weights  ?Weight (lbs) 197 lb 185 lb 194 lb 11.2 oz  ?Weight (kg) 89.359 kg 83.915 kg 88.315 kg  ?   ?Body mass index is 29.09 kg/m?.  ?General:  Well nourished, well developed, in no acute distress. ?HEENT: normal, glasses on ?Neck: no JVD ?Vascular: No carotid bruits; Distal pulses 2+ bilaterally ?Cardiac:  normal S1, S2; RRR; no murmur  ?Lungs:  clear to auscultation bilaterally, no wheezing, rhonchi or rales  ?Abd: soft, nontender, no  hepatomegaly  ?Ext: no edema ?Musculoskeletal:  No deformities, BUE and BLE strength normal and equal ?Skin: warm and dry  ?Neuro:  CNs 2-12 intact, no focal abnormalities noted ?Psych:  Normal affect  ? ?

## 2022-02-08 NOTE — ED Notes (Signed)
Dr. Corky Downs was notified in person of pts trop 966 ?

## 2022-02-08 NOTE — Consult Note (Addendum)
ANTICOAGULATION CONSULT NOTE - Initial Consult ? ?Pharmacy Consult for heparin infusion ?Indication: chest pain/ACS ? ?Allergies  ?Allergen Reactions  ? Simvastatin Other (See Comments)  ?  Muscle aches  ? Statins Other (See Comments)  ?  Joint pain   ? ? ?Patient Measurements: ?Height: '5\' 9"'$  (175.3 cm) ?Weight: 89.4 kg (197 lb) ?IBW/kg (Calculated) : 70.7 ?Heparin Dosing Weight: 88.7 kg ? ?Vital Signs: ?Temp: 97.7 ?F (36.5 ?C) (05/12 9758) ?Temp Source: Oral (05/12 8325) ?BP: 157/89 (05/12 1030) ?Pulse Rate: 60 (05/12 1030) ? ?Labs: ?Recent Labs  ?  02/08/22 ?0916  ?HGB 13.4  ?HCT 40.9  ?PLT 286  ?APTT 22*  ?LABPROT 13.4  ?INR 1.0  ?CREATININE 1.31*  ?TROPONINIHS 966*  ? ? ?Estimated Creatinine Clearance: 56.4 mL/min (A) (by C-G formula based on SCr of 1.31 mg/dL (H)). ? ? ?Medical History: ?Past Medical History:  ?Diagnosis Date  ? CKD (chronic kidney disease), stage II   ? Diabetes mellitus without complication ([place])   ? Hyperlipidemia   ? Hypertension   ? ? ?Medications:  ?Scheduled:  ? [START ON 02/09/2022] amLODipine  5 mg Oral Daily  ? aspirin  324 mg Oral Once  ? [START ON 02/09/2022] aspirin  81 mg Oral Pre-Cath  ? ezetimibe  10 mg Oral Daily  ? sodium chloride flush  3 mL Intravenous Q12H  ? ? ?Assessment: ?Patient with PMH relevant for CKD, diabetes, hypertension, hyperlipidemia. Admitted with chest pain. Pharmacy consulted to manage heparin infusion for ACS/STEMI. ? ?Goal of Therapy:  ?Heparin level 0.3-0.7 units/ml ?QDIY64-158 units/ml ?Monitor platelets by anticoagulation protocol: Yes ?  ?Plan:  ?Give 4000 units bolus x 1 ?Start heparin infusion at 1200 units/hr ?Check anti-Xa level in 8 hours and daily while on heparin ?Continue to monitor H&H and platelets ? ?Vana Arif Rodriguez-Guzman PharmD, BCPS ?02/08/2022 11:26 AM ? ? ?

## 2022-02-08 NOTE — Consult Note (Signed)
?Cardiology Consultation:  ? ?Patient ID: Daniel Fuentes ?MRN: [id number]; DOB: May 08, 1951 ? ?Admit date: 02/08/2022 ?Date of Consult: 02/08/2022 ? ?PCP:  Romualdo Bolk, FNP ?  ?[facility] Providers ?Cardiologist:  Ida Rogue, MD  ?Patient Profile:  ? ?Daniel Fuentes is a 71 y.o. male with a hx of essential HTN, HLD, DM, CKD, previous surgery for subdural hematoma, abdominal aortic aneurysm 3.0 cm in 2018, former smoker who is being seen 02/08/2022 for the evaluation of chest pain and abnormal EKG at the request of Dr. Corky Downs. ? ?History of Present Illness:  ? ?Daniel Fuentes stated that last evening after dinner he had noted some chest discomfort, epigastric, without radiation , and thought it was indigestion since he had eaten smothered liver for dinner. Of note he did state that he had push mowed the lawn on yesterday for about an hour and did not have any of the chest discomfort/chest tightness with that exertion. He had woken the this morning and stated that he just didn't feel great but the chest discomfort had resolved and he got ready and went to work at his office in [place]. Once he arrived the elevator was non-functioning and he had to walk up the stairs to the third floor to get to his office. He did state the building is an old building and he had to travel up 20 plus stairs each flight. He noted an increased amount of shortness of breath when walking upstairs. After being at work he started to have the chest discomfort/tightness again and had episodes of dizziness that started associated with nausea and diarrhea, which he noted was unusual for him. While sitting on the commode he felt as though he would pass out and became extremely diaphoretic. Since his symptoms were not improving he drove himself to the [facility] emergency department.  ? ?On arrival to the emergency department he was diaphoretic with blood pressures in the 26'R systolic and with chest tightness, some confusion, and left arm tingling. EKG  revealed new RBBB. Creatinine slightly elevated at 1.31, BNP 137.6, and HS troponin 966.  ? ? ?Past Medical History:  ?Diagnosis Date  ? CKD (chronic kidney disease), stage II   ? Diabetes mellitus without complication ([place])   ? Hyperlipidemia   ? Hypertension   ? ? ?Past Surgical History:  ?Procedure Laterality Date  ? BRAIN SURGERY  2017  ? Subdural Hematoma  ? COLONOSCOPY WITH PROPOFOL N/A 05/30/2021  ? Procedure: COLONOSCOPY WITH PROPOFOL;  Surgeon: Lin Landsman, MD;  Location: [facility] ENDOSCOPY;  Service: Gastroenterology;  Laterality: N/A;  ?  ? ?Home Medications:  ?Prior to Admission medications   ?Medication Sig Start Date End Date Taking? Authorizing Provider  ?Alirocumab 150 MG/ML SOAJ Inject into the skin. 12/24/19   [provider]  ?amLODipine (NORVASC) 5 MG tablet Take 5 mg by mouth daily. 11/24/19   [provider]  ?aspirin EC 81 MG tablet Take 1 tablet (81 mg total) by mouth daily. 06/10/17   Hubbard Hartshorn, FNP  ?Cholecalciferol (VITAMIN D3) 2000 units capsule Take by mouth.    [provider]  ?DOCOSAHEXAENOIC ACID PO Take by mouth.    [provider]  ?ezetimibe (ZETIA) 10 MG tablet Take 1 tablet (10 mg total) by mouth daily. ?Patient not taking: Reported on 05/30/2021 05/14/18   Steele Sizer, MD  ?hydrochlorothiazide (HYDRODIURIL) 25 MG tablet Take 25 mg by mouth daily. 12/20/19   [provider]  ?losartan (COZAAR) 100 MG tablet TAKE 1 TABLET BY  MOUTH ONCE DAILY 07/09/18   Steele Sizer, MD  ?metFORMIN (GLUCOPHAGE) 1000 MG tablet Take 1,000 mg by mouth daily. 12/06/19   [provider]  ?Multiple Vitamins-Minerals (MULTIVITAMIN WITH MINERALS) tablet Take by mouth.    [provider]  ?sildenafil (VIAGRA) 100 MG tablet Take by mouth. 10/08/19 10/07/20  [provider]  ?solifenacin (VESICARE) 5 MG tablet Take 5 mg by mouth daily. 01/03/20   [provider]  ?valsartan (DIOVAN) 160 MG tablet Take 160 mg by mouth daily.  12/27/19   [provider]  ?valsartan-hydrochlorothiazide (DIOVAN HCT) 160-25 MG tablet Take 1 tablet by mouth daily. 05/14/18   Steele Sizer, MD  ?vitamin C (ASCORBIC ACID) 500 MG tablet Take 500 mg by mouth.    [provider]  ? ? ?Inpatient Medications: ?Scheduled Meds: ? [START ON 02/09/2022] amLODipine  5 mg Oral Daily  ? [START ON 02/09/2022] aspirin  81 mg Oral Pre-Cath  ? ezetimibe  10 mg Oral Daily  ? heparin  4,000 Units Intravenous Once  ? sodium chloride flush  3 mL Intravenous Q12H  ? ?Continuous Infusions: ? sodium chloride    ? sodium chloride    ? [START ON 02/09/2022] sodium chloride    ? Followed by  ? [START ON 02/09/2022] sodium chloride    ? heparin    ? ?PRN Meds: ? ? ?Allergies:    ?Allergies  ?Allergen Reactions  ? Simvastatin Other (See Comments)  ?  Muscle aches  ? Statins Other (See Comments)  ?  Joint pain   ? ? ?Social History:   ?Social History  ? ?Socioeconomic History  ? Marital status: Married  ?  Spouse name: Benard Fuentes  ? Number of children: 1  ? Years of education: Not on file  ? Highest education level: Doctorate  ?Occupational History  ? Occupation: Chief Executive Officer  ?Tobacco Use  ? Smoking status: Some Days  ?  Packs/day: 0.20  ?  Years: 50.00  ?  Pack years: 10.00  ?  Types: Pipe, Cigarettes  ?  Start date: 09/04/1966  ? Smokeless tobacco: Never  ?Vaping Use  ? Vaping Use: Never used  ?Substance and Sexual Activity  ? Alcohol use: Yes  ?  Alcohol/week: 3.0 standard drinks  ?  Types: 3 Shots of liquor per week  ? Drug use: No  ? Sexual activity: Yes  ?  Partners: Female  ?Other Topics Concern  ? Not on file  ?Social History Narrative  ? 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  ? ?Social Determinants of Health  ? ?Financial Resource Strain: Not on file  ?Food Insecurity: Not on file  ?Transportation Needs: Not on file  ?Physical Activity: Not on file  ?Stress: Not on file   ?Social Connections: Not on file  ?Intimate Partner Violence: Not on file  ?  ?Family History:   ?Family History  ?Problem Relation Age of Onset  ? Heart failure Mother   ? Diabetes Mother   ? Hypertension Mother   ? Hyperlipidemia Mother   ? Cancer Father   ?     Brain  ? Diabetes Sister   ? Heart disease Brother   ?     Heart Attack - was on drugs  ?  ? ?ROS:  ?Please see the history of present illness.  ?Review of Systems  ?Constitutional: Positive for diaphoresis.  ?HENT: Negative.    ?Eyes: Negative.   ?  Cardiovascular:  Positive for chest pain and dyspnea on exertion.  ?Respiratory:  Positive for shortness of breath.   ?Endocrine: Negative.   ?Hematologic/Lymphatic: Negative.   ?Skin:   ?     Has what he thinks is a bug bite to the left side of his head  ?Musculoskeletal:  Positive for muscle weakness.  ?Gastrointestinal:  Positive for abdominal pain and diarrhea.  ?Genitourinary: Negative.   ?Neurological:  Positive for light-headedness and weakness.  ?Psychiatric/Behavioral: Negative.    ?Allergic/Immunologic: Negative.   ? ?All other ROS reviewed and negative.    ? ?Physical Exam/Data:  ? ?Vitals:  ? 02/08/22 1030 02/08/22 1045 02/08/22 1100 02/08/22 1115  ?BP: (!) 157/89 (!) 159/94 (!) 147/77 (!) 149/85  ?Pulse: 60 65 (!) 55 61  ?Resp: '15 16 17 14  '$ ?Temp:      ?TempSrc:      ?SpO2: 100% 100% 100% 100%  ?Weight:      ?Height:      ? ?No intake or output data in the 24 hours ending 02/08/22 1150 ? ?  02/08/2022  ?  9:03 AM 05/30/2021  ?  9:47 AM 05/14/2018  ? 10:01 AM  ?Last 3 Weights  ?Weight (lbs) 197 lb 185 lb 194 lb 11.2 oz  ?Weight (kg) 89.359 kg 83.915 kg 88.315 kg  ?   ?Body mass index is 29.09 kg/m?.  ?General:  Well nourished, well developed, in no acute distress. ?HEENT: normal, glasses on ?Neck: no JVD ?Vascular: No carotid bruits; Distal pulses 2+ bilaterally ?Cardiac:  normal S1, S2; RRR; no murmur  ?Lungs:  clear to auscultation bilaterally, no wheezing, rhonchi or rales  ?Abd: soft, nontender, no  hepatomegaly  ?Ext: no edema ?Musculoskeletal:  No deformities, BUE and BLE strength normal and equal ?Skin: warm and dry  ?Neuro:  CNs 2-12 intact, no focal abnormalities noted ?Psych:  Normal affect  ? ?

## 2022-02-08 NOTE — H&P (Signed)
?History and Physical  ? ? ?Daniel Fuentes TIR:[id number] DOB: 1951/02/05 DOA: 02/08/2022 ? ?PCP: Romualdo Bolk, FNP ?Patient coming from: Home ? ?Chief Complaint: Chest pain ? ?HPI: Daniel Fuentes is a 71 y.o. male with medical history significant of COPD, CAD, tobacco use, subdural hematoma, HTN, DM 2, HLD comes to the hospital with complaints of chest pain.  Patient states after dinner last night he had some epigastric discomfort but went to sleep and when he woke up this morning and went to work while climbing his stairs he felt more than usual shortness of breath later developed some diaphoresis.  He continued to work through this and then went back to his car he felt lightheaded and dizzy and drove himself to the hospital for further evaluation.  Denies having similar symptoms in the past. ? ? ?In the ED patient was initially diaphoretic, elevated troponin.  EKG showed inferior lateral lead T wave inversion.  Cardiology team was consulted who recommended cardiac catheterization. ? ?Review of Systems: As per HPI otherwise 10 point review of systems negative.  ?Review of Systems ?Otherwise negative except as per HPI, including: ?General: Denies fever, chills, night sweats or unintended weight loss. ?Resp: Denies cough, wheezing, shortness of breath. ?Cardiac: Denies  palpitations, orthopnea, paroxysmal nocturnal dyspnea. ?GI: Denies abdominal pain, nausea, vomiting, diarrhea or constipation ?GU: Denies dysuria, frequency, hesitancy or incontinence ?MS: Denies muscle aches, joint pain or swelling ?Neuro: Denies headache, neurologic deficits (focal weakness, numbness, tingling), abnormal gait ?Psych: Denies anxiety, depression, SI/HI/AVH ?Skin: Denies new rashes or lesions ?ID: Denies sick contacts, exotic exposures, travel ? ?Past Medical History:  ?Diagnosis Date  ? CKD (chronic kidney disease), stage II   ? Diabetes mellitus without complication ([place])   ? Hyperlipidemia   ? Hypertension   ? ? ?Past Surgical  History:  ?Procedure Laterality Date  ? BRAIN SURGERY  2017  ? Subdural Hematoma  ? COLONOSCOPY WITH PROPOFOL N/A 05/30/2021  ? Procedure: COLONOSCOPY WITH PROPOFOL;  Surgeon: Lin Landsman, MD;  Location[facility] ENDOSCOPY;  Service: Gastroenterology;  Laterality: N/A;  ? ? ?SOCIAL HISTORY: ? reports that he has been smoking pipe and cigarettes. He started smoking about 55 years ago. He has a 10.00 pack-year smoking history. He has never used smokeless tobacco. He reports current alcohol use of about 3.0 standard drinks per week. He reports that he does not use drugs. ? ?Allergies  ?Allergen Reactions  ? Simvastatin Other (See Comments)  ?  Muscle aches  ? Statins Other (See Comments)  ?  Joint pain   ? ? ?FAMILY HISTORY: ?Family History  ?Problem Relation Age of Onset  ? Heart failure Mother   ? Diabetes Mother   ? Hypertension Mother   ? Hyperlipidemia Mother   ? Cancer Father   ?     Brain  ? Diabetes Sister   ? Heart disease Brother   ?     Heart Attack - was on drugs  ? ? ? ?Prior to Admission medications   ?Medication Sig Start Date End Date Taking? Authorizing Provider  ?Alirocumab 150 MG/ML SOAJ Inject into the skin. 12/24/19   [provider]  ?amLODipine (NORVASC) 5 MG tablet Take 5 mg by mouth daily. 11/24/19   [provider]  ?aspirin EC 81 MG tablet Take 1 tablet (81 mg total) by mouth daily. 06/10/17   Hubbard Hartshorn, FNP  ?Cholecalciferol (VITAMIN D3) 2000 units capsule Take by mouth.    [provider]  ?DOCOSAHEXAENOIC ACID PO  Take by mouth.    [provider]  ?ezetimibe (ZETIA) 10 MG tablet Take 1 tablet (10 mg total) by mouth daily. ?Patient not taking: Reported on 05/30/2021 05/14/18   Steele Sizer, MD  ?hydrochlorothiazide (HYDRODIURIL) 25 MG tablet Take 25 mg by mouth daily. 12/20/19   [provider]  ?losartan (COZAAR) 100 MG tablet TAKE 1 TABLET BY MOUTH ONCE DAILY 07/09/18   Steele Sizer, MD  ?metFORMIN (GLUCOPHAGE) 1000 MG tablet Take 1,000  mg by mouth daily. 12/06/19   [provider]  ?Multiple Vitamins-Minerals (MULTIVITAMIN WITH MINERALS) tablet Take by mouth.    [provider]  ?sildenafil (VIAGRA) 100 MG tablet Take by mouth. 10/08/19 10/07/20  [provider]  ?solifenacin (VESICARE) 5 MG tablet Take 5 mg by mouth daily. 01/03/20   [provider]  ?valsartan (DIOVAN) 160 MG tablet Take 160 mg by mouth daily. 12/27/19   [provider]  ?valsartan-hydrochlorothiazide (DIOVAN HCT) 160-25 MG tablet Take 1 tablet by mouth daily. 05/14/18   Steele Sizer, MD  ?vitamin C (ASCORBIC ACID) 500 MG tablet Take 500 mg by mouth.    [provider]  ? ? ?Physical Exam: ?Vitals:  ? 02/08/22 0945 02/08/22 1000 02/08/22 1015 02/08/22 1030  ?BP: 111/66 114/75 137/75 (!) 157/89  ?Pulse: (!) 51 (!) 47 (!) 54 60  ?Resp: '11 17 17 15  '$ ?Temp:      ?TempSrc:      ?SpO2: 99% 93% 99% 100%  ?Weight:      ?Height:      ? ? ? ? ?Constitutional: NAD, calm, comfortable ?Eyes: PERRL, lids and conjunctivae normal ?ENMT: Mucous membranes are moist. Posterior pharynx clear of any exudate or lesions.Normal dentition.  ?Neck: normal, supple, no masses, no thyromegaly ?Respiratory: clear to auscultation bilaterally, no wheezing, no crackles. Normal respiratory effort. No accessory muscle use.  ?Cardiovascular: Regular rate and rhythm, no murmurs / rubs / gallops. No extremity edema. 2+ pedal pulses. No carotid bruits.  ?Abdomen: no tenderness, no masses palpated. No hepatosplenomegaly. Bowel sounds positive.  ?Musculoskeletal: no clubbing / cyanosis. No joint deformity upper and lower extremities. Good ROM, no contractures. Normal muscle tone.  ?Skin: no rashes, lesions, ulcers. No induration ?Neurologic: CN 2-12 grossly intact. Sensation intact, DTR normal. Strength 5/5 in all 4.  ?Psychiatric: Normal judgment and insight. Alert and oriented x 3. Normal mood.  ? ?Labs on Admission: I have personally reviewed following labs and imaging  studies ? ?CBC: ?Recent Labs  ?Lab 02/08/22 ?0916  ?WBC 6.6  ?NEUTROABS 2.3  ?HGB 13.4  ?HCT 40.9  ?MCV 89.3  ?PLT 286  ? ?Basic Metabolic Panel: ?Recent Labs  ?Lab 02/08/22 ?0916  ?NA 138  ?K 3.5  ?CL 106  ?CO2 23  ?GLUCOSE 178*  ?BUN 19  ?CREATININE 1.31*  ?CALCIUM 9.0  ? ?GFR: ?Estimated Creatinine Clearance: 56.4 mL/min (A) (by C-G formula based on SCr of 1.31 mg/dL (H)). ?Liver Function Tests: ?Recent Labs  ?Lab 02/08/22 ?0916  ?AST 43*  ?ALT 13  ?ALKPHOS 57  ?BILITOT 0.8  ?PROT 6.7  ?ALBUMIN 3.6  ? ?No results for input(s): LIPASE, AMYLASE in the last 168 hours. ?No results for input(s): AMMONIA in the last 168 hours. ?Coagulation Profile: ?Recent Labs  ?Lab 02/08/22 ?0916  ?INR 1.0  ? ?Cardiac Enzymes: ?No results for input(s): CKTOTAL, CKMB, CKMBINDEX, TROPONINI in the last 168 hours. ?BNP (last 3 results) ?No results for input(s): PROBNP in the last 8760 hours. ?HbA1C: ?No results for input(s): HGBA1C in  the last 72 hours. ?CBG: ?Recent Labs  ?Lab 02/08/22 ?0908  ?GLUCAP 185*  ? ?Lipid Profile: ?No results for input(s): CHOL, HDL, LDLCALC, TRIG, CHOLHDL, LDLDIRECT in the last 72 hours. ?Thyroid Function Tests: ?No results for input(s): TSH, T4TOTAL, FREET4, T3FREE, THYROIDAB in the last 72 hours. ?Anemia Panel: ?No results for input(s): VITAMINB12, FOLATE, FERRITIN, TIBC, IRON, RETICCTPCT in the last 72 hours. ?Urine analysis: ?No results found for: COLORURINE, APPEARANCEUR, [place], [place], [place], [place], [place], KETONESUR, PROTEINUR, [place], NITRITE, LEUKOCYTESUR ?Sepsis Labs: !!!!!!!!!!!!!!!!!!!!!!!!!!!!!!!!!!!!!!!!!!!! ?'@LABRCNTIP'$ (procalcitonin:4,lacticidven:4) ?)No results found for this or any previous visit (from the past 240 hour(s)).  ? ?Radiological Exams on Admission: ?DG Chest 2 View ? ?Result Date: 02/08/2022 ?CLINICAL DATA:  Chest pain. EXAM: CHEST - 2 VIEW COMPARISON:  CT chest 06/11/2017. FINDINGS: No consolidation. No visible pleural effusions or pneumothorax. Cardiomediastinal  silhouette is within normal limits. No displaced fracture. IMPRESSION: No evidence of acute cardiopulmonary disease. Electronically Signed   By: Margaretha Sheffield M.D.   On: 02/08/2022 09:29   ? ? ?All i

## 2022-02-09 ENCOUNTER — Inpatient Hospital Stay (HOSPITAL_COMMUNITY)
Admit: 2022-02-09 | Discharge: 2022-02-09 | Disposition: A | Discharge status: Home / Self Care | Payer: Medicare Other | Attending: Internal Medicine | Admitting: Internal Medicine

## 2022-02-09 DIAGNOSIS — I214 Non-ST elevation (NSTEMI) myocardial infarction: Secondary | ICD-10-CM | POA: Diagnosis not present

## 2022-02-09 DIAGNOSIS — E876 Hypokalemia: Secondary | ICD-10-CM

## 2022-02-09 LAB — BASIC METABOLIC PANEL
Anion gap: 4 — ABNORMAL LOW (ref 5–15)
Anion gap: 6 (ref 5–15)
BUN: 17 mg/dL (ref 8–23)
BUN: 15 mg/dL (ref 8–23)
CO2: 24 mmol/L (ref 22–32)
CO2: 23 mmol/L (ref 22–32)
Calcium: 8.3 mg/dL — ABNORMAL LOW (ref 8.9–10.3)
Calcium: 8.5 mg/dL — ABNORMAL LOW (ref 8.9–10.3)
Chloride: 107 mmol/L (ref 98–111)
Chloride: 108 mmol/L (ref 98–111)
Creatinine, Ser: 1.02 mg/dL (ref 0.61–1.24)
Creatinine, Ser: 0.92 mg/dL (ref 0.61–1.24)
GFR, Estimated: >60 mL/min (ref >60)
GFR, Estimated: >60 mL/min (ref >60)
Glucose, Bld: 121 mg/dL — ABNORMAL HIGH (ref 70–99)
Glucose, Bld: 120 mg/dL — ABNORMAL HIGH (ref 70–99)
Potassium: 3.3 mmol/L — ABNORMAL LOW (ref 3.5–5.1)
Potassium: 3.4 mmol/L — ABNORMAL LOW (ref 3.5–5.1)
Sodium: 135 mmol/L (ref 135–145)
Sodium: 137 mmol/L (ref 135–145)

## 2022-02-09 LAB — CBC
HCT: 38.4 % — ABNORMAL LOW (ref 39.0–52.0)
Hemoglobin: 12.8 g/dL — ABNORMAL LOW (ref 13.0–17.0)
MCH: 28.8 pg (ref 26.0–34.0)
MCHC: 33.3 g/dL (ref 30.0–36.0)
MCV: 86.5 fL (ref 80.0–100.0)
Platelets: 249 10*3/uL (ref 150–400)
RBC: 4.44 MIL/uL (ref 4.22–5.81)
RDW: 14.4 % (ref 11.5–15.5)
WBC: 6.6 10*3/uL (ref 4.0–10.5)
nRBC: 0 % (ref 0.0–0.2)

## 2022-02-09 LAB — HEPARIN LEVEL (UNFRACTIONATED)
Heparin Unfractionated: 0.29 IU/mL — ABNORMAL LOW (ref 0.30–0.70)
Heparin Unfractionated: 0.24 IU/mL — ABNORMAL LOW (ref 0.30–0.70)
Heparin Unfractionated: 0.44 IU/mL (ref 0.30–0.70)

## 2022-02-09 LAB — ECHOCARDIOGRAM COMPLETE
AR max vel: 2.01 cm2
AV Peak grad: 11.4 mmHg
Ao pk vel: 1.69 m/s
Area-P 1/2: 2.89 cm2
Calc EF: 60.5 %
Height: 69 in
S' Lateral: 3.6 cm
Single Plane A2C EF: 56.8 %
Single Plane A4C EF: 62.6 %
Weight: 2859.2 oz

## 2022-02-09 LAB — MAGNESIUM: Magnesium: 2 mg/dL (ref 1.7–2.4)

## 2022-02-09 LAB — HEMOGLOBIN A1C
Hgb A1c MFr Bld: 5.9 % — ABNORMAL HIGH (ref 4.8–5.6)
Mean Plasma Glucose: 122.63 mg/dL

## 2022-02-09 LAB — TROPONIN I (HIGH SENSITIVITY): Troponin I (High Sensitivity): 10077 ng/L (ref <18)

## 2022-02-09 MED ORDER — HEPARIN BOLUS VIA INFUSION
1200.0000 [IU] | Freq: Once | INTRAVENOUS | Status: AC
  Administered 2022-02-09: 1200 [IU] via INTRAVENOUS
  Filled 2022-02-09: qty 1200

## 2022-02-09 MED ORDER — POTASSIUM CHLORIDE CRYS ER 20 MEQ PO TBCR
20.0000 meq | EXTENDED_RELEASE_TABLET | Freq: Once | ORAL | Status: AC
  Administered 2022-02-09: 20 meq via ORAL
  Filled 2022-02-09: qty 1

## 2022-02-09 MED ORDER — AMLODIPINE BESYLATE 10 MG PO TABS
10.0000 mg | ORAL_TABLET | Freq: Every day | ORAL | Status: DC
  Administered 2022-02-10 – 2022-02-12 (×2): 10 mg via ORAL
  Filled 2022-02-09 (×2): qty 1

## 2022-02-09 MED ORDER — ALPRAZOLAM 0.25 MG PO TABS
0.2500 mg | ORAL_TABLET | Freq: Once | ORAL | Status: AC
  Administered 2022-02-09: 0.25 mg via ORAL
  Filled 2022-02-09: qty 1

## 2022-02-09 MED ORDER — SODIUM CHLORIDE 0.9% FLUSH
3.0000 mL | Freq: Two times a day (BID) | INTRAVENOUS | Status: DC
  Administered 2022-02-09 – 2022-02-12 (×4): 3 mL via INTRAVENOUS

## 2022-02-09 MED ORDER — METOPROLOL TARTRATE 25 MG PO TABS
12.5000 mg | ORAL_TABLET | Freq: Two times a day (BID) | ORAL | Status: DC
  Administered 2022-02-09 – 2022-02-10 (×4): 12.5 mg via ORAL
  Filled 2022-02-09 (×5): qty 1

## 2022-02-09 MED ORDER — AMLODIPINE BESYLATE 5 MG PO TABS
5.0000 mg | ORAL_TABLET | Freq: Once | ORAL | Status: AC
  Administered 2022-02-09: 5 mg via ORAL
  Filled 2022-02-09: qty 1

## 2022-02-09 NOTE — Consult Note (Signed)
ANTICOAGULATION CONSULT NOTE  ? ?Pharmacy Consult for heparin infusion ?Indication: chest pain/ACS ? ?Allergies  ?Allergen Reactions  ? Simvastatin Other (See Comments)  ?  Muscle aches  ? Statins Other (See Comments)  ?  Joint pain   ? ? ?Patient Measurements: ?Height: '5\' 9"'$  (175.3 cm) ?Weight: 81.1 kg (178 lb 11.2 oz) ?IBW/kg (Calculated) : 70.7 ?Heparin Dosing Weight: 88.7 kg ? ?Vital Signs: ?Temp: 98.3 ?F (36.8 ?C) (05/13 9767) ?Temp Source: Oral (05/13 3419) ?BP: 159/90 (05/13 0854) ?Pulse Rate: 75 (05/13 0854) ? ?Labs: ?Recent Labs  ?  02/08/22 ?0916 02/08/22 ?1844 02/09/22 ?0203 02/09/22 ?3790 02/09/22 ?2409 02/09/22 ?1059  ?HGB 13.4  --   --  12.8*  --   --   ?HCT 40.9  --   --  38.4*  --   --   ?PLT 286  --   --  249  --   --   ?APTT 22*  --   --   --   --   --   ?LABPROT 13.4  --   --   --   --   --   ?INR 1.0  --   --   --   --   --   ?HEPARINUNFRC  --  0.57 0.29*  --   --  0.24*  ?CREATININE 1.31*  --  1.02 0.92  --   --   ?TROPONINIHS 966* 73,532*  --   --  10,077*  --   ? ? ? ?Estimated Creatinine Clearance: 72.6 mL/min (by C-G formula based on SCr of 0.92 mg/dL). ? ? ?Medical History: ?Past Medical History:  ?Diagnosis Date  ? CKD (chronic kidney disease), stage II   ? Diabetes mellitus without complication ([place])   ? Hyperlipidemia   ? Hypertension   ? ? ?Medications:  ?Scheduled:  ? amLODipine  5 mg Oral Daily  ? aspirin  81 mg Oral Daily  ? clopidogrel  75 mg Oral Q breakfast  ? ezetimibe  10 mg Oral Daily  ? metoprolol tartrate  12.5 mg Oral BID  ? sodium chloride flush  3 mL Intravenous Q12H  ? sodium chloride flush  3 mL Intravenous Q12H  ? sodium chloride flush  3 mL Intravenous Q12H  ? ? ?Assessment: ?Patient with PMH relevant for CKD, diabetes, hypertension, hyperlipidemia. Admitted with chest pain. Pharmacy consulted to manage heparin infusion for ACS/STEMI. ? ?5/13 0203 HL 0.29  ?5/13 1059 HL 0.24   subthera,  will order bolus and increase drip from 1350 u/hr to 1500 u/hr ? ?Goal of Therapy:   ?Heparin level 0.3-0.7 units/ml ?Monitor platelets by anticoagulation protocol: Yes ?  ?Plan:  ?5/13 1059 HL 0.24    Heparin level subtherapeutic.  Will order bolus of 1200 units and increase drip from 1350 u/hr to 1500 u/hr ?Recheck heparin level in 8 hours. CBC daily while on heparin.  ? ?Chinita Greenland PharmD ?Clinical Pharmacist ?02/09/2022 ? ? ? ?

## 2022-02-09 NOTE — Consult Note (Signed)
ANTICOAGULATION CONSULT NOTE  ? ?Pharmacy Consult for heparin infusion ?Indication: chest pain/ACS ? ?Allergies  ?Allergen Reactions  ? Simvastatin Other (See Comments)  ?  Muscle aches  ? Statins Other (See Comments)  ?  Joint pain   ? ? ?Patient Measurements: ?Height: '5\' 9"'$  (175.3 cm) ?Weight: 81.1 kg (178 lb 11.2 oz) ?IBW/kg (Calculated) : 70.7 ?Heparin Dosing Weight: 88.7 kg ? ?Vital Signs: ?Temp: 98.1 ?F (36.7 ?C) (05/12 2347) ?Temp Source: Oral (05/12 2347) ?BP: 160/82 (05/12 2349) ?Pulse Rate: 66 (05/12 2349) ? ?Labs: ?Recent Labs  ?  02/08/22 ?0916 02/08/22 ?1844 02/09/22 ?0203  ?HGB 13.4  --   --   ?HCT 40.9  --   --   ?PLT 286  --   --   ?APTT 22*  --   --   ?LABPROT 13.4  --   --   ?INR 1.0  --   --   ?HEPARINUNFRC  --  0.57 0.29*  ?CREATININE 1.31*  --  1.02  ?TROPONINIHS 966* 18,478*  --   ? ? ? ?Estimated Creatinine Clearance: 65.5 mL/min (by C-G formula based on SCr of 1.02 mg/dL). ? ? ?Medical History: ?Past Medical History:  ?Diagnosis Date  ? CKD (chronic kidney disease), stage II   ? Diabetes mellitus without complication ([place])   ? Hyperlipidemia   ? Hypertension   ? ? ?Medications:  ?Scheduled:  ? amLODipine  5 mg Oral Daily  ? aspirin  81 mg Oral Daily  ? clopidogrel  75 mg Oral Q breakfast  ? ezetimibe  10 mg Oral Daily  ? sodium chloride flush  3 mL Intravenous Q12H  ? sodium chloride flush  3 mL Intravenous Q12H  ? ? ?Assessment: ?Patient with PMH relevant for CKD, diabetes, hypertension, hyperlipidemia. Admitted with chest pain. Pharmacy consulted to manage heparin infusion for ACS/STEMI. ? ?5/13 0203 HL 0.29  ? ?Goal of Therapy:  ?Heparin level 0.3-0.7 units/ml ?Monitor platelets by anticoagulation protocol: Yes ?  ?Plan:  ?Heparin level is slightly subtherapeutic. Will increase the heparin infusion to 1350 units/hr. Recheck heparin level in 8 hours. CBC daily while on heparin.  ? ?Eleonore Chiquito PharmD, BCPS ?02/09/2022 2:52 AM ? ? ?

## 2022-02-09 NOTE — Progress Notes (Signed)
?PROGRESS NOTE ? ? ? ?Daniel Fuentes  TDD:[id number] DOB: 27-Feb-1951 DOA: 02/08/2022 ?PCP: Romualdo Bolk, FNP  ? ? ?Brief Narrative:  ?Daniel Fuentes is a 71 y.o. male with medical history significant of COPD, CAD, tobacco use, subdural hematoma, HTN, DM 2, HLD comes to the hospital with complaints of chest pain.  Patient states after dinner last night he had some epigastric discomfort but went to sleep and when he woke up this morning and went to work while climbing his stairs he felt more than usual shortness of breath later developed some diaphoresis.  He continued to work through this and then went back to his car he felt lightheaded and dizzy and drove himself to the hospital for further evaluation.  Denies having similar symptoms in the past. ?  ?  ?In the ED patient was initially diaphoretic, elevated troponin.  EKG showed inferior lateral lead T wave inversion.  Cardiology team was consulted who recommended cardiac catheterization. ? ?5/13 5/12 ? ?Consultants:  ?Cardiology ? ?Procedures: Cardiac cath ? ?Antimicrobials:  ?  ? ? ?Subjective: ?No chest pain or shortness of breath ? ?Objective: ?Vitals:  ? 02/09/22 0408 02/09/22 0506 02/09/22 0854 02/09/22 1354  ?BP: (!) 180/94 (!) 161/80 (!) 159/90 (!) 147/75  ?Pulse: 62  75 67  ?Resp: 17  20   ?Temp: 98.2 ?F (36.8 ?C)  98.3 ?F (36.8 ?C) 98.8 ?F (37.1 ?C)  ?TempSrc: Oral  Oral Oral  ?SpO2: 100%  100% 100%  ?Weight:      ?Height:      ? ? ?Intake/Output Summary (Last 24 hours) at 02/09/2022 1452 ?Last data filed at 02/09/2022 1037 ?Gross per 24 hour  ?Intake 240 ml  ?Output 700 ml  ?Net -460 ml  ? ?Filed Weights  ? 02/08/22 0903 02/08/22 1900  ?Weight: 89.4 kg 81.1 kg  ? ? ?Examination: ?Calm, NAD ?Cta no w/r ?Reg s1/s2 no gallop ?Soft benign +bs ?No edema ?Aaoxox3  ?Mood and affect appropriate in current setting  ? ? ?Data Reviewed: I have personally reviewed following labs and imaging studies ? ?CBC: ?Recent Labs  ?Lab 02/08/22 ?0916 02/09/22 ?0428  ?WBC 6.6 6.6   ?NEUTROABS 2.3  --   ?HGB 13.4 12.8*  ?HCT 40.9 38.4*  ?MCV 89.3 86.5  ?PLT 286 249  ? ?Basic Metabolic Panel: ?Recent Labs  ?Lab 02/08/22 ?0916 02/09/22 ?0203 02/09/22 ?0428  ?NA 138 135 137  ?K 3.5 3.3* 3.4*  ?CL 106 107 108  ?CO2 '23 24 23  '$ ?GLUCOSE 178* 121* 120*  ?BUN '19 17 15  '$ ?CREATININE 1.31* 1.02 0.92  ?CALCIUM 9.0 8.3* 8.5*  ?MG  --  2.0  --   ? ?GFR: ?Estimated Creatinine Clearance: 72.6 mL/min (by C-G formula based on SCr of 0.92 mg/dL). ?Liver Function Tests: ?Recent Labs  ?Lab 02/08/22 ?0916  ?AST 43*  ?ALT 13  ?ALKPHOS 57  ?BILITOT 0.8  ?PROT 6.7  ?ALBUMIN 3.6  ? ?No results for input(s): LIPASE, AMYLASE in the last 168 hours. ?No results for input(s): AMMONIA in the last 168 hours. ?Coagulation Profile: ?Recent Labs  ?Lab 02/08/22 ?0916  ?INR 1.0  ? ?Cardiac Enzymes: ?No results for input(s): CKTOTAL, CKMB, CKMBINDEX, TROPONINI in the last 168 hours. ?BNP (last 3 results) ?No results for input(s): PROBNP in the last 8760 hours. ?HbA1C: ?Recent Labs  ?  02/08/22 ?1844  ?HGBA1C 5.9*  ? ?CBG: ?Recent Labs  ?Lab 02/08/22 ?0908 02/08/22 ?2139  ?GLUCAP 185* 126*  ? ?Lipid Profile: ?Recent Labs  ?  02/08/22 ?1844  ?CHOL 181  ?HDL 48  ?LDLCALC 110*  ?TRIG 114  ?CHOLHDL 3.8  ? ?Thyroid Function Tests: ?No results for input(s): TSH, T4TOTAL, FREET4, T3FREE, THYROIDAB in the last 72 hours. ?Anemia Panel: ?No results for input(s): VITAMINB12, FOLATE, FERRITIN, TIBC, IRON, RETICCTPCT in the last 72 hours. ?Sepsis Labs: ?Recent Labs  ?Lab 02/08/22 ?0916 02/08/22 ?1844  ?LATICACIDVEN 1.8 1.2  ? ? ?Recent Results (from the past 240 hour(s))  ?Blood culture (routine x 2)     Status: None (Preliminary result)  ? Collection Time: 02/08/22  9:16 AM  ? Specimen: BLOOD  ?Result Value Ref Range Status  ? Specimen Description BLOOD BLOOD RIGHT FOREARM  Final  ? Special Requests   Final  ?  BOTTLES DRAWN AEROBIC AND ANAEROBIC Blood Culture adequate volume  ? Culture   Final  ?  NO GROWTH < 24 HOURS ?Performed at [facility], [address] [place], [place] [address] ?  ? Report Status PENDING  Incomplete  ?Blood culture (routine x 2)     Status: None (Preliminary result)  ? Collection Time: 02/08/22  9:16 AM  ? Specimen: BLOOD  ?Result Value Ref Range Status  ? Specimen Description BLOOD BLOOD LEFT HAND  Final  ? Special Requests   Final  ?  BOTTLES DRAWN AEROBIC AND ANAEROBIC Blood Culture adequate volume  ? Culture   Final  ?  NO GROWTH < 24 HOURS ?Performed at [facility], [address], [place],  [address] ?  ? Report Status PENDING  Incomplete  ?  ? ? ? ? ? ?Radiology Studies: ?DG Chest 2 View ? ?Result Date: 02/08/2022 ?CLINICAL DATA:  Chest pain. EXAM: CHEST - 2 VIEW COMPARISON:  CT chest 06/11/2017. FINDINGS: No consolidation. No visible pleural effusions or pneumothorax. Cardiomediastinal silhouette is within normal limits. No displaced fracture. IMPRESSION: No evidence of acute cardiopulmonary disease. Electronically Signed   By: Margaretha Sheffield M.D.   On: 02/08/2022 09:29  ? ?CARDIAC CATHETERIZATION ? ?Result Date: 02/08/2022 ?Conclusions: Severe two-vessel coronary artery disease with thrombotic occlusion of the proximal/mid RCA followed by sequential 70-80% mid and distal RCA stenoses, 90% proximal RPL a lesion, and subtotal occlusions of distal rPLA and rPL2, as well as sequential mid/distal LCx and OM 2 lesions of 60-80%. Large LAD with 20-30% proximal disease as well as 50% stenosis of distal portion of D1 and 90% stenosis in small D3. Low normal left ventricular systolic function with subtle basal and mid inferior hypokinesis (LVEF 50-55%). Normal left ventricular filling pressure (LVEDP 10 mmHg). Successful PCI of proximal through distal RCA and rPLA using overlapping Onyx Frontier 3.0 x 38 mm and 3.5 x 12 mm drug-eluting stents in the proximal/mid vessel as well as nonoverlapping Onyx Frontier 3.5 x 8 mm and 2.0 x 12 mm drug-eluting stents to treat the distal RCA and rPLA,  respectively.  Excellent angiographic result with 0% residual stenosis was achieved. Recommendations: Plan for staged PCI to LCx/OM2 on Monday with Dr. Fletcher Anon. Dual antiplatelet therapy with aspirin and clopidogrel for at least 12 months, ideally longer. Restart IV heparin 2 hours after TR band removal, to be continued until LCx revascularization has been performed. Aggressive secondary prevention.  Continue ezetimibe with plans to add PCSK9 inhibitor as an outpatient given history of statin intolerance. Follow-up echocardiogram. Nelva Bush, MD [facility] ? ?ECHOCARDIOGRAM COMPLETE ? ?Result Date: 02/09/2022 ?   ECHOCARDIOGRAM REPORT   Patient Name:   KELLY EISLER Date of Exam: 02/09/2022 Medical  Rec #:  [id number]     Height:       69.0 in Accession #:    [id number]    Weight:       178.7 lb Date of Birth:  1951-03-29      BSA:          1.969 m? Patient Age:    63 years      BP:           160/82 mmHg Patient Gender: M             HR:           68 bpm. Exam Location:  [facility] Procedure: 2D Echo Indications:     NSTEMI  History:         Patient has no prior history of Echocardiogram examinations.                  CAD, COPD; Risk Factors:Hypertension, Diabetes, CKD II and                  Current Smoker.  Sonographer:     L Thornton-Maynard Referring Phys:  [id number] Diagnosing Phys: Nelva Bush MD IMPRESSIONS  1. Left ventricular ejection fraction, by estimation, is 50 to 55%. The left ventricle has low normal function. The left ventricle demonstrates regional wall motion abnormalities (see scoring diagram/findings for description). There is moderate left ventricular hypertrophy. Left ventricular diastolic parameters are consistent with Grade I diastolic dysfunction (impaired relaxation). There is mild hypokinesis of the left ventricular, basal-mid inferior wall and inferoseptal wall.  2. Right ventricular systolic function is mildly reduced. The right ventricular size is normal. There is normal  pulmonary artery systolic pressure.  3. Right atrial size was mildly dilated.  4. The mitral valve is normal in structure. Mild mitral valve regurgitation. No evidence of mitral stenosis.  5. Tricuspid valve regurgitati

## 2022-02-09 NOTE — Consult Note (Signed)
ANTICOAGULATION CONSULT NOTE  ? ?Pharmacy Consult for heparin infusion ?Indication: chest pain/ACS ? ?Allergies  ?Allergen Reactions  ? Simvastatin Other (See Comments)  ?  Muscle aches  ? Statins Other (See Comments)  ?  Joint pain   ? ? ?Patient Measurements: ?Height: '5\' 9"'$  (175.3 cm) ?Weight: 81.1 kg (178 lb 11.2 oz) ?IBW/kg (Calculated) : 70.7 ?Heparin Dosing Weight: 88.7 kg ? ?Vital Signs: ?Temp: 97.8 ?F (36.6 ?C) (05/13 1946) ?Temp Source: Oral (05/13 1700) ?BP: 171/80 (05/13 1946) ?Pulse Rate: 67 (05/13 1946) ? ?Labs: ?Recent Labs  ?  02/08/22 ?0916 02/08/22 ?1844 02/08/22 ?1844 02/09/22 ?0203 02/09/22 ?4627 02/09/22 ?0350 02/09/22 ?1059 02/09/22 ?2051  ?HGB 13.4  --   --   --  12.8*  --   --   --   ?HCT 40.9  --   --   --  38.4*  --   --   --   ?PLT 286  --   --   --  249  --   --   --   ?APTT 22*  --   --   --   --   --   --   --   ?LABPROT 13.4  --   --   --   --   --   --   --   ?INR 1.0  --   --   --   --   --   --   --   ?HEPARINUNFRC  --  0.57   < > 0.29*  --   --  0.24* 0.44  ?CREATININE 1.31*  --   --  1.02 0.92  --   --   --   ?TROPONINIHS 966* 18,478*  --   --   --  10,077*  --   --   ? < > = values in this interval not displayed.  ? ? ? ?Estimated Creatinine Clearance: 72.6 mL/min (by C-G formula based on SCr of 0.92 mg/dL). ? ? ?Medical History: ?Past Medical History:  ?Diagnosis Date  ? CKD (chronic kidney disease), stage II   ? Diabetes mellitus without complication ([place])   ? Hyperlipidemia   ? Hypertension   ? ? ?Medications:  ?Scheduled:  ? [START ON 02/10/2022] amLODipine  10 mg Oral Daily  ? aspirin  81 mg Oral Daily  ? clopidogrel  75 mg Oral Q breakfast  ? ezetimibe  10 mg Oral Daily  ? metoprolol tartrate  12.5 mg Oral BID  ? sodium chloride flush  3 mL Intravenous Q12H  ? sodium chloride flush  3 mL Intravenous Q12H  ? sodium chloride flush  3 mL Intravenous Q12H  ? ? ?Assessment: ?Patient with PMH relevant for CKD, diabetes, hypertension, hyperlipidemia. Admitted with chest pain.  Pharmacy consulted to manage heparin infusion for ACS/STEMI. ? ?5/13 0203 HL 0.29  ?5/13 1059 HL 0.24   subthera,  will order bolus and increase drip from 1350 u/hr to 1500 u/hr ?5/13 2051 HL 0.44   therapeutic  ? ?Goal of Therapy:  ?Heparin level 0.3-0.7 units/ml ?Monitor platelets by anticoagulation protocol: Yes ?  ?Plan:  ?Heparin level is therapeutic. Will continue heparin at 1500 u/hr. Recheck heparin level in 8 hours to confirm. CBC daily while on heparin.  ? ?Sherilyn Banker, PharmD ?Clinical Pharmacist  ?02/09/2022 9:41 PM  ? ? ? ? ?

## 2022-02-09 NOTE — Progress Notes (Signed)
? ?Cardiology Progress Note  ? ?Patient Name: Daniel Fuentes ?Date of Encounter: 02/09/2022 ? ?Primary Cardiologist: Ida Rogue, MD ? ?Subjective  ? ?No chest pain, dyspnea, presyncope, diaphoresis overnight.  Hemodynamically stable. ? ?Inpatient Medications  ?  ?Scheduled Meds: ? amLODipine  5 mg Oral Daily  ? aspirin  81 mg Oral Daily  ? clopidogrel  75 mg Oral Q breakfast  ? ezetimibe  10 mg Oral Daily  ? sodium chloride flush  3 mL Intravenous Q12H  ? sodium chloride flush  3 mL Intravenous Q12H  ? ?Continuous Infusions: ? sodium chloride    ? heparin 1,350 Units/hr (02/09/22 0257)  ? ?PRN Meds: ?sodium chloride, acetaminophen, dextromethorphan-guaiFENesin, hydrALAZINE, ipratropium-albuterol, metoprolol tartrate, nitroGLYCERIN, oxyCODONE, senna-docusate, sodium chloride flush, traZODone  ? ?Vital Signs  ?  ?Vitals:  ? 02/08/22 2347 02/08/22 2349 02/09/22 0408 02/09/22 0506  ?BP: (!) 160/82 (!) 160/82 (!) 180/94 (!) 161/80  ?Pulse: 62 66 62   ?Resp: 16  17   ?Temp: 98.1 ?F (36.7 ?C)  98.2 ?F (36.8 ?C)   ?TempSrc: Oral  Oral   ?SpO2:  99% 100%   ?Weight:      ?Height:      ? ? ?Intake/Output Summary (Last 24 hours) at 02/09/2022 0820 ?Last data filed at 02/09/2022 0604 ?Gross per 24 hour  ?Intake --  ?Output 700 ml  ?Net -700 ml  ? ?Filed Weights  ? 02/08/22 0903 02/08/22 1900  ?Weight: 89.4 kg 81.1 kg  ? ? ?Physical Exam  ? ?GEN: Well nourished, well developed, in no acute distress.  ?HEENT: Grossly normal.  ?Neck: Supple, no JVD, carotid bruits, or masses. ?Cardiac: RRR, no murmurs, rubs, or gallops. No clubbing, cyanosis, edema.  Radials 2+, DP/PT 2+ and equal bilaterally.  R radial cath site w/o bleeding/bruit/hematoma. ?Respiratory:  Respirations regular and unlabored, clear to auscultation bilaterally. ?GI: Soft, nontender, nondistended, BS + x 4. ?MS: no deformity or atrophy. ?Skin: warm and dry, no rash. ?Neuro:  Strength and sensation are intact. ?Psych: AAOx3.  Normal affect. ? ?Labs  ?   ?Chemistry ?Recent Labs  ?Lab 02/08/22 ?0916 02/09/22 ?0203 02/09/22 ?0428  ?NA 138 135 137  ?K 3.5 3.3* 3.4*  ?CL 106 107 108  ?CO2 '23 24 23  '$ ?GLUCOSE 178* 121* 120*  ?BUN '19 17 15  '$ ?CREATININE 1.31* 1.02 0.92  ?CALCIUM 9.0 8.3* 8.5*  ?PROT 6.7  --   --   ?ALBUMIN 3.6  --   --   ?AST 43*  --   --   ?ALT 13  --   --   ?ALKPHOS 57  --   --   ?BILITOT 0.8  --   --   ?GFRNONAA 58* >60 >60  ?ANIONGAP 9 4* 6  ?  ? ?Hematology ?Recent Labs  ?Lab 02/08/22 ?0916 02/09/22 ?0428  ?WBC 6.6 6.6  ?RBC 4.58 4.44  ?HGB 13.4 12.8*  ?HCT 40.9 38.4*  ?MCV 89.3 86.5  ?MCH 29.3 28.8  ?MCHC 32.8 33.3  ?RDW 14.6 14.4  ?PLT 286 249  ? ? ?Cardiac Enzymes  ?Recent Labs  ?Lab 02/08/22 ?0916 02/08/22 ?1844  ?TROPONINIHS 966* [id number]*  ?   ? ?BNP ?   ?Component Value Date/Time  ? BNP 137.6 (H) 02/08/2022 8937  ? ?Lipids  ?Lab Results  ?Component Value Date  ? CHOL 181 02/08/2022  ? HDL 48 02/08/2022  ? LDLCALC 110 (H) 02/08/2022  ? TRIG 114 02/08/2022  ? CHOLHDL 3.8 02/08/2022  ? ? ?HbA1c  ?Lab Results  ?  Component Value Date  ? HGBA1C 5.9 (H) 02/08/2022  ? ? ?Radiology  ?  ?DG Chest 2 View ? ?Result Date: 02/08/2022 ?CLINICAL DATA:  Chest pain. EXAM: CHEST - 2 VIEW COMPARISON:  CT chest 06/11/2017. FINDINGS: No consolidation. No visible pleural effusions or pneumothorax. Cardiomediastinal silhouette is within normal limits. No displaced fracture. IMPRESSION: No evidence of acute cardiopulmonary disease. Electronically Signed   By: Margaretha Sheffield M.D.   On: 02/08/2022 09:29  ? ?Telemetry  ?  ?RSR, 60's to 70's, 8 beats NSVT - Personally Reviewed ? ?Cardiac Studies  ? ?Cardiac Catheterization and Percutaneous Coronary Intervention 5.12.2023 ? ?Left Main  ?Vessel is large. Vessel is angiographically normal.  ?Left Anterior Descending  ?Vessel is large.  ?Mid LAD lesion is 25% stenosed.  ?First Diagonal Branch  ?Vessel is moderate in size.  ?1st Diag lesion is 50% stenosed.  ?Second Diagonal Branch  ?Vessel is large in size.  ?Third Diagonal  Branch  ?Vessel is small in size.  ?3rd Diag lesion is 90% stenosed.  ?Left Circumflex  ?Vessel is large.  ?Mid Cx lesion is 70% stenosed. The lesion is eccentric.  ?Dist Cx-1 lesion is 95% stenosed.  ?Dist Cx-2 lesion is 60% stenosed.  ?First Obtuse Marginal Branch  ?Vessel is small in size.  ?Second Obtuse Marginal Branch  ?Vessel is large in size.  ?2nd Mrg lesion is 80% stenosed.  ?Third Obtuse Marginal Branch  ?Vessel is large in size.  ?Right Coronary Artery  ?Vessel is large.  ?Prox RCA to Mid RCA lesion is 100% stenosed. The lesion is thrombotic.  ?Mid RCA lesion is 80% stenosed.  ?Dist RCA lesion is 70% stenosed. The lesion is eccentric. ?     **RCA successfully stented w/ 3.0 x 38 Onyx Frontier DES prox/mid, 3.5 x 12 Onyx Frontier DES mid, 3.5 x 8 mm Onyx Frontier DES distal.  ?Right Posterior Descending Artery  ?Vessel is moderate in size.  ?Right Posterior Atrioventricular Artery  ?Vessel is moderate in size.  ?RPAV-1 lesion is 90% stenosed. The lesion is focal and eccentric. ?     **The RPAV-1 successfully stented w/ a 2.0 x 12 Onyx Frontier DES.  ?RPAV-2 lesion is 99% stenosed.  ?First Right Posterolateral Branch  ?Vessel is small in size.  ?1st RPL lesion is 50% stenosed.  ?Second Right Posterolateral Branch  ?Vessel is small in size.  ?2nd RPL lesion is 99% stenosed.  ? ?EF 50-55% ?_____________  ? ?Patient Profile  ?   ?71 y.o. male w/ a h/o HTN, HL, DMII, CKD II, prior subdural hematoma in the setting of head trauma s/p evacuation, AAA, and remote tob abuse, who presented 5/12 w/ presyncope, diaphoresis, dyspnea, episodic c/p, and relative hypotension, who ruled in for NSTEMI and was found to have an occluded RCA and severe LCX dzs. ? ?Assessment & Plan  ?  ?1.  NSTEMI/CAD:  Presented 5/12 following episode of c/p the night prior and development of DOE, presyncope, diaphoresis, and relative hypotension.  R/I for NSTEMI w/ HsTrop of 966  [id number].  Cath 5/12 w/ occluded prox RCA s/p DES x 3 to the  prox/mid/distal RCA and DES x 1 to the RPAV1.  Residual severe LCX dzs pending staged PCI on Monday.  Feels well this AM. All questions answered.  Cont asa, heparin, brilinta, zetia.  Statin intolerant.  Relative bradycardia precluded ? blocker usage on admission though rates currently stable and w/ 8 beats NSVT, will add low-dose metoprolol.  Echo pending. EF 50-55% by LV  gram. ? ?2.  Essential HTN:  BPs elevated in setting of holding of home meds - hypotensive on arrival.  Home amlodipine dose resumed.  Adding low-dose ? blocker.  Follow and plan to resume losartan pending response. ? ?3.  HL:  LDL 110.  Statin intolerant.  Zetia added.  Discussed role of PCSK9i - pt wasn't initially interested buy now willing to consider. Will see if pharmacy can assist in getting him started prior to discharge. ? ?4.  DMII:  Well-controlled.  A1c 5.9.  Per IM.  ? ?5.  CKD II: Creat stable post-cath. Resume ARB.  Follow. ? ?Signed, ?Murray Hodgkins, NP  ?02/09/2022, 8:20 AM   ? ?For questions or updates, please contact   ?Please consult [email or web address] for contact info under Cardiology/STEMI.  ?

## 2022-02-10 DIAGNOSIS — I214 Non-ST elevation (NSTEMI) myocardial infarction: Secondary | ICD-10-CM | POA: Diagnosis not present

## 2022-02-10 LAB — CBC
HCT: 38.4 % — ABNORMAL LOW (ref 39.0–52.0)
Hemoglobin: 12.7 g/dL — ABNORMAL LOW (ref 13.0–17.0)
MCH: 29 pg (ref 26.0–34.0)
MCHC: 33.1 g/dL (ref 30.0–36.0)
MCV: 87.7 fL (ref 80.0–100.0)
Platelets: 231 10*3/uL (ref 150–400)
RBC: 4.38 MIL/uL (ref 4.22–5.81)
RDW: 14.7 % (ref 11.5–15.5)
WBC: 5.7 10*3/uL (ref 4.0–10.5)
nRBC: 0 % (ref 0.0–0.2)

## 2022-02-10 LAB — BASIC METABOLIC PANEL
Anion gap: 8 (ref 5–15)
BUN: 13 mg/dL (ref 8–23)
CO2: 26 mmol/L (ref 22–32)
Calcium: 8.7 mg/dL — ABNORMAL LOW (ref 8.9–10.3)
Chloride: 102 mmol/L (ref 98–111)
Creatinine, Ser: 1.26 mg/dL — ABNORMAL HIGH (ref 0.61–1.24)
GFR, Estimated: >60 mL/min (ref >60)
Glucose, Bld: 180 mg/dL — ABNORMAL HIGH (ref 70–99)
Potassium: 4 mmol/L (ref 3.5–5.1)
Sodium: 136 mmol/L (ref 135–145)

## 2022-02-10 LAB — MAGNESIUM: Magnesium: 2.2 mg/dL (ref 1.7–2.4)

## 2022-02-10 LAB — HEPARIN LEVEL (UNFRACTIONATED): Heparin Unfractionated: 0.45 IU/mL (ref 0.30–0.70)

## 2022-02-10 MED ORDER — LACTATED RINGERS IV SOLN
INTRAVENOUS | Status: DC
Start: 2022-02-10 — End: 2022-02-12

## 2022-02-10 MED ORDER — MELATONIN 5 MG PO TABS
5.0000 mg | ORAL_TABLET | Freq: Every evening | ORAL | Status: DC | PRN
  Administered 2022-02-10 – 2022-02-11 (×2): 5 mg via ORAL
  Filled 2022-02-10 (×2): qty 1

## 2022-02-10 NOTE — Progress Notes (Signed)
? ?Progress Note ? ?Patient Name: Daniel Fuentes ?Date of Encounter: 02/10/2022 ? ?[facility] Cardiologist: Ida Rogue, MD  ? ?Subjective  ? ?NAEO. ?Trouble sleeping. Asking for stronger sleep aid. ? ?Inpatient Medications  ?  ?Scheduled Meds: ? amLODipine  10 mg Oral Daily  ? aspirin  81 mg Oral Daily  ? clopidogrel  75 mg Oral Q breakfast  ? ezetimibe  10 mg Oral Daily  ? metoprolol tartrate  12.5 mg Oral BID  ? sodium chloride flush  3 mL Intravenous Q12H  ? sodium chloride flush  3 mL Intravenous Q12H  ? sodium chloride flush  3 mL Intravenous Q12H  ? ?Continuous Infusions: ? sodium chloride    ? heparin 1,500 Units/hr (02/10/22 0602)  ? lactated ringers 50 mL/hr at 02/10/22 0931  ? ?PRN Meds: ?sodium chloride, acetaminophen, dextromethorphan-guaiFENesin, hydrALAZINE, ipratropium-albuterol, metoprolol tartrate, nitroGLYCERIN, oxyCODONE, senna-docusate, sodium chloride flush, traZODone  ? ?Vital Signs  ?  ?Vitals:  ? 02/09/22 1700 02/09/22 1946 02/10/22 0016 02/10/22 0343  ?BP: (!) 150/65 (!) 171/80 (!) 135/95 (!) 154/91  ?Pulse:  67 (!) 54 64  ?Resp: '20 18 16 16  '$ ?Temp: 98.4 ?F (36.9 ?C) 97.8 ?F (36.6 ?C) 98.9 ?F (37.2 ?C) 98.5 ?F (36.9 ?C)  ?TempSrc: Oral  Oral Oral  ?SpO2: 98% 100% 100% 100%  ?Weight:      ?Height:      ? ? ?Intake/Output Summary (Last 24 hours) at 02/10/2022 1228 ?Last data filed at 02/10/2022 1010 ?Gross per 24 hour  ?Intake 673.1 ml  ?Output 1050 ml  ?Net -376.9 ml  ? ? ?  02/08/2022  ?  7:00 PM 02/08/2022  ?  9:03 AM 05/30/2021  ?  9:47 AM  ?Last 3 Weights  ?Weight (lbs) 178 lb 11.2 oz 197 lb 185 lb  ?Weight (kg) 81.058 kg 89.359 kg 83.915 kg  ?   ? ?Physical Exam  ? ?GEN: No acute distress.   ?Neck: No JVD ?Cardiac: RRR, no murmurs, rubs, or gallops.  ?Respiratory: Clear to auscultation bilaterally. ?GI: Soft, nontender, non-distended  ?MS: No edema; No deformity. ?Neuro:  Nonfocal  ?Psych: Normal affect  ? ?Labs  ?  ?High Sensitivity Troponin:   ?Recent Labs  ?Lab 02/08/22 ?0916  02/08/22 ?1844 02/09/22 ?8469  ?TROPONINIHS 966* [id number]* 10,077*  ?   ?Chemistry ?Recent Labs  ?Lab 02/08/22 ?0916 02/09/22 ?0203 02/09/22 ?0428 02/10/22 ?6295  ?NA 138 135 137 136  ?K 3.5 3.3* 3.4* 4.0  ?CL 106 107 108 102  ?CO2 '23 24 23 26  '$ ?GLUCOSE 178* 121* 120* 180*  ?BUN '19 17 15 13  '$ ?CREATININE 1.31* 1.02 0.92 1.26*  ?CALCIUM 9.0 8.3* 8.5* 8.7*  ?MG  --  2.0  --  2.2  ?PROT 6.7  --   --   --   ?ALBUMIN 3.6  --   --   --   ?AST 43*  --   --   --   ?ALT 13  --   --   --   ?ALKPHOS 57  --   --   --   ?BILITOT 0.8  --   --   --   ?GFRNONAA 58* >60 >60 >60  ?ANIONGAP 9 4* 6 8  ?  ?Lipids  ?Recent Labs  ?Lab 02/08/22 ?1844  ?CHOL 181  ?TRIG 114  ?HDL 48  ?LDLCALC 110*  ?CHOLHDL 3.8  ?  ?Hematology ?Recent Labs  ?Lab 02/08/22 ?0916 02/09/22 ?0428 02/10/22 ?2841  ?WBC 6.6 6.6 5.7  ?RBC 4.58  4.44 4.38  ?HGB 13.4 12.8* 12.7*  ?HCT 40.9 38.4* 38.4*  ?MCV 89.3 86.5 87.7  ?MCH 29.3 28.8 29.0  ?MCHC 32.8 33.3 33.1  ?RDW 14.6 14.4 14.7  ?PLT 286 249 231  ? ?Thyroid No results for input(s): TSH, FREET4 in the last 168 hours.  ?BNP ?Recent Labs  ?Lab 02/08/22 ?0918  ?BNP 137.6*  ?  ?DDimer No results for input(s): DDIMER in the last 168 hours.  ? ?Radiology  ?  ?CARDIAC CATHETERIZATION ? ?Result Date: 02/08/2022 ?Conclusions: Severe two-vessel coronary artery disease with thrombotic occlusion of the proximal/mid RCA followed by sequential 70-80% mid and distal RCA stenoses, 90% proximal RPL a lesion, and subtotal occlusions of distal rPLA and rPL2, as well as sequential mid/distal LCx and OM 2 lesions of 60-80%. Large LAD with 20-30% proximal disease as well as 50% stenosis of distal portion of D1 and 90% stenosis in small D3. Low normal left ventricular systolic function with subtle basal and mid inferior hypokinesis (LVEF 50-55%). Normal left ventricular filling pressure (LVEDP 10 mmHg). Successful PCI of proximal through distal RCA and rPLA using overlapping Onyx Frontier 3.0 x 38 mm and 3.5 x 12 mm drug-eluting stents in  the proximal/mid vessel as well as nonoverlapping Onyx Frontier 3.5 x 8 mm and 2.0 x 12 mm drug-eluting stents to treat the distal RCA and rPLA, respectively.  Excellent angiographic result with 0% residual stenosis was achieved. Recommendations: Plan for staged PCI to LCx/OM2 on Monday with Dr. Fletcher Anon. Dual antiplatelet therapy with aspirin and clopidogrel for at least 12 months, ideally longer. Restart IV heparin 2 hours after TR band removal, to be continued until LCx revascularization has been performed. Aggressive secondary prevention.  Continue ezetimibe with plans to add PCSK9 inhibitor as an outpatient given history of statin intolerance. Follow-up echocardiogram. Nelva Bush, MD [facility] ? ?ECHOCARDIOGRAM COMPLETE ? ?Result Date: 02/09/2022 ?   ECHOCARDIOGRAM REPORT   Patient Name:   Daniel Fuentes Date of Exam: 02/09/2022 Medical Rec #:  [id number]     Height:       69.0 in Accession #:    [id number]    Weight:       178.7 lb Date of Birth:  Feb 22, 1951      BSA:          1.969 m? Patient Age:    71 years      BP:           160/82 mmHg Patient Gender: M             HR:           68 bpm. Exam Location:  [facility] Procedure: 2D Echo Indications:     NSTEMI  History:         Patient has no prior history of Echocardiogram examinations.                  CAD, COPD; Risk Factors:Hypertension, Diabetes, CKD II and                  Current Smoker.  Sonographer:     L Thornton-Maynard Referring Phys:  [id number] CHRISTOPHER END Diagnosing Phys: Nelva Bush MD IMPRESSIONS  1. Left ventricular ejection fraction, by estimation, is 50 to 55%. The left ventricle has low normal function. The left ventricle demonstrates regional wall motion abnormalities (see scoring diagram/findings for description). There is moderate left ventricular hypertrophy. Left ventricular diastolic parameters are consistent with Grade I diastolic dysfunction (impaired relaxation). There is mild hypokinesis  of the left ventricular, basal-mid inferior  wall and inferoseptal wall.  2. Right ventricular systolic function is mildly reduced. The right ventricular size is normal. There is normal pulmonary artery systolic pressure.  3. Right atrial size was mildly dilated.  4. The mitral valve is normal in structure. Mild mitral valve regurgitation. No evidence of mitral stenosis.  5. Tricuspid valve regurgitation is mild to moderate.  6. The aortic valve is tricuspid. There is mild calcification of the aortic valve. There is moderate thickening of the aortic valve. Aortic valve regurgitation is not visualized. Aortic valve sclerosis/calcification is present, without any evidence of aortic stenosis.  7. The inferior vena cava is dilated in size with >50% respiratory variability, suggesting right atrial pressure of 8 mmHg. FINDINGS  Left Ventricle: Left ventricular ejection fraction, by estimation, is 50 to 55%. The left ventricle has low normal function. The left ventricle demonstrates regional wall motion abnormalities. Mild hypokinesis of the left ventricular, basal-mid inferior  wall and inferoseptal wall. The left ventricular internal cavity size was normal in size. There is moderate left ventricular hypertrophy. Left ventricular diastolic parameters are consistent with Grade I diastolic dysfunction (impaired relaxation). Right Ventricle: The right ventricular size is normal. No increase in right ventricular wall thickness. Right ventricular systolic function is mildly reduced. There is normal pulmonary artery systolic pressure. The tricuspid regurgitant velocity is 2.55 m/s, and with an assumed right atrial pressure of 8 mmHg, the estimated right ventricular systolic pressure is 35.7 mmHg. Left Atrium: Left atrial size was normal in size. Right Atrium: Right atrial size was mildly dilated. Pericardium: Trivial pericardial effusion is present. Mitral Valve: The mitral valve is normal in structure. Mild mitral valve regurgitation. No evidence of mitral valve  stenosis. Tricuspid Valve: The tricuspid valve is normal in structure. Tricuspid valve regurgitation is mild to moderate. Aortic Valve: The aortic valve is tricuspid. There is mild calcification of the aortic valve.

## 2022-02-10 NOTE — H&P (View-Only) (Signed)
? ?Progress Note ? ?Patient Name: Daniel Fuentes ?Date of Encounter: 02/10/2022 ? ?[facility] Cardiologist: Ida Rogue, MD  ? ?Subjective  ? ?NAEO. ?Trouble sleeping. Asking for stronger sleep aid. ? ?Inpatient Medications  ?  ?Scheduled Meds: ? amLODipine  10 mg Oral Daily  ? aspirin  81 mg Oral Daily  ? clopidogrel  75 mg Oral Q breakfast  ? ezetimibe  10 mg Oral Daily  ? metoprolol tartrate  12.5 mg Oral BID  ? sodium chloride flush  3 mL Intravenous Q12H  ? sodium chloride flush  3 mL Intravenous Q12H  ? sodium chloride flush  3 mL Intravenous Q12H  ? ?Continuous Infusions: ? sodium chloride    ? heparin 1,500 Units/hr (02/10/22 0602)  ? lactated ringers 50 mL/hr at 02/10/22 0931  ? ?PRN Meds: ?sodium chloride, acetaminophen, dextromethorphan-guaiFENesin, hydrALAZINE, ipratropium-albuterol, metoprolol tartrate, nitroGLYCERIN, oxyCODONE, senna-docusate, sodium chloride flush, traZODone  ? ?Vital Signs  ?  ?Vitals:  ? 02/09/22 1700 02/09/22 1946 02/10/22 0016 02/10/22 0343  ?BP: (!) 150/65 (!) 171/80 (!) 135/95 (!) 154/91  ?Pulse:  67 (!) 54 64  ?Resp: '20 18 16 16  '$ ?Temp: 98.4 ?F (36.9 ?C) 97.8 ?F (36.6 ?C) 98.9 ?F (37.2 ?C) 98.5 ?F (36.9 ?C)  ?TempSrc: Oral  Oral Oral  ?SpO2: 98% 100% 100% 100%  ?Weight:      ?Height:      ? ? ?Intake/Output Summary (Last 24 hours) at 02/10/2022 1228 ?Last data filed at 02/10/2022 1010 ?Gross per 24 hour  ?Intake 673.1 ml  ?Output 1050 ml  ?Net -376.9 ml  ? ? ?  02/08/2022  ?  7:00 PM 02/08/2022  ?  9:03 AM 05/30/2021  ?  9:47 AM  ?Last 3 Weights  ?Weight (lbs) 178 lb 11.2 oz 197 lb 185 lb  ?Weight (kg) 81.058 kg 89.359 kg 83.915 kg  ?   ? ?Physical Exam  ? ?GEN: No acute distress.   ?Neck: No JVD ?Cardiac: RRR, no murmurs, rubs, or gallops.  ?Respiratory: Clear to auscultation bilaterally. ?GI: Soft, nontender, non-distended  ?MS: No edema; No deformity. ?Neuro:  Nonfocal  ?Psych: Normal affect  ? ?Labs  ?  ?High Sensitivity Troponin:   ?Recent Labs  ?Lab 02/08/22 ?0916  02/08/22 ?1844 02/09/22 ?2229  ?TROPONINIHS 966* [id number]* 10,077*  ?   ?Chemistry ?Recent Labs  ?Lab 02/08/22 ?0916 02/09/22 ?0203 02/09/22 ?0428 02/10/22 ?7989  ?NA 138 135 137 136  ?K 3.5 3.3* 3.4* 4.0  ?CL 106 107 108 102  ?CO2 '23 24 23 26  '$ ?GLUCOSE 178* 121* 120* 180*  ?BUN '19 17 15 13  '$ ?CREATININE 1.31* 1.02 0.92 1.26*  ?CALCIUM 9.0 8.3* 8.5* 8.7*  ?MG  --  2.0  --  2.2  ?PROT 6.7  --   --   --   ?ALBUMIN 3.6  --   --   --   ?AST 43*  --   --   --   ?ALT 13  --   --   --   ?ALKPHOS 57  --   --   --   ?BILITOT 0.8  --   --   --   ?GFRNONAA 58* >60 >60 >60  ?ANIONGAP 9 4* 6 8  ?  ?Lipids  ?Recent Labs  ?Lab 02/08/22 ?1844  ?CHOL 181  ?TRIG 114  ?HDL 48  ?LDLCALC 110*  ?CHOLHDL 3.8  ?  ?Hematology ?Recent Labs  ?Lab 02/08/22 ?0916 02/09/22 ?0428 02/10/22 ?2119  ?WBC 6.6 6.6 5.7  ?RBC 4.58  4.44 4.38  ?HGB 13.4 12.8* 12.7*  ?HCT 40.9 38.4* 38.4*  ?MCV 89.3 86.5 87.7  ?MCH 29.3 28.8 29.0  ?MCHC 32.8 33.3 33.1  ?RDW 14.6 14.4 14.7  ?PLT 286 249 231  ? ?Thyroid No results for input(s): TSH, FREET4 in the last 168 hours.  ?BNP ?Recent Labs  ?Lab 02/08/22 ?0918  ?BNP 137.6*  ?  ?DDimer No results for input(s): DDIMER in the last 168 hours.  ? ?Radiology  ?  ?CARDIAC CATHETERIZATION ? ?Result Date: 02/08/2022 ?Conclusions: Severe two-vessel coronary artery disease with thrombotic occlusion of the proximal/mid RCA followed by sequential 70-80% mid and distal RCA stenoses, 90% proximal RPL a lesion, and subtotal occlusions of distal rPLA and rPL2, as well as sequential mid/distal LCx and OM 2 lesions of 60-80%. Large LAD with 20-30% proximal disease as well as 50% stenosis of distal portion of D1 and 90% stenosis in small D3. Low normal left ventricular systolic function with subtle basal and mid inferior hypokinesis (LVEF 50-55%). Normal left ventricular filling pressure (LVEDP 10 mmHg). Successful PCI of proximal through distal RCA and rPLA using overlapping Onyx Frontier 3.0 x 38 mm and 3.5 x 12 mm drug-eluting stents in  the proximal/mid vessel as well as nonoverlapping Onyx Frontier 3.5 x 8 mm and 2.0 x 12 mm drug-eluting stents to treat the distal RCA and rPLA, respectively.  Excellent angiographic result with 0% residual stenosis was achieved. Recommendations: Plan for staged PCI to LCx/OM2 on Monday with Dr. Fletcher Anon. Dual antiplatelet therapy with aspirin and clopidogrel for at least 12 months, ideally longer. Restart IV heparin 2 hours after TR band removal, to be continued until LCx revascularization has been performed. Aggressive secondary prevention.  Continue ezetimibe with plans to add PCSK9 inhibitor as an outpatient given history of statin intolerance. Follow-up echocardiogram. Nelva Bush, MD [facility] ? ?ECHOCARDIOGRAM COMPLETE ? ?Result Date: 02/09/2022 ?   ECHOCARDIOGRAM REPORT   Patient Name:   [name] Date of Exam: 02/09/2022 Medical Rec #:  [id number]     Height:       69.0 in Accession #:    [id number]    Weight:       178.7 lb Date of Birth:  [birth year]      BSA:          1.969 m? Patient Age:    71 years      BP:           160/82 mmHg Patient Gender: M             HR:           68 bpm. Exam Location:  [facility] Procedure: 2D Echo Indications:     NSTEMI  History:         Patient has no prior history of Echocardiogram examinations.                  CAD, COPD; Risk Factors:Hypertension, Diabetes, CKD II and                  Current Smoker.  Sonographer:     L Thornton-Maynard Referring Phys:  [id number] CHRISTOPHER END Diagnosing Phys: Nelva Bush MD IMPRESSIONS  1. Left ventricular ejection fraction, by estimation, is 50 to 55%. The left ventricle has low normal function. The left ventricle demonstrates regional wall motion abnormalities (see scoring diagram/findings for description). There is moderate left ventricular hypertrophy. Left ventricular diastolic parameters are consistent with Grade I diastolic dysfunction (impaired relaxation). There is mild hypokinesis  of the left ventricular, basal-mid inferior  wall and inferoseptal wall.  2. Right ventricular systolic function is mildly reduced. The right ventricular size is normal. There is normal pulmonary artery systolic pressure.  3. Right atrial size was mildly dilated.  4. The mitral valve is normal in structure. Mild mitral valve regurgitation. No evidence of mitral stenosis.  5. Tricuspid valve regurgitation is mild to moderate.  6. The aortic valve is tricuspid. There is mild calcification of the aortic valve. There is moderate thickening of the aortic valve. Aortic valve regurgitation is not visualized. Aortic valve sclerosis/calcification is present, without any evidence of aortic stenosis.  7. The inferior vena cava is dilated in size with >50% respiratory variability, suggesting right atrial pressure of 8 mmHg. FINDINGS  Left Ventricle: Left ventricular ejection fraction, by estimation, is 50 to 55%. The left ventricle has low normal function. The left ventricle demonstrates regional wall motion abnormalities. Mild hypokinesis of the left ventricular, basal-mid inferior  wall and inferoseptal wall. The left ventricular internal cavity size was normal in size. There is moderate left ventricular hypertrophy. Left ventricular diastolic parameters are consistent with Grade I diastolic dysfunction (impaired relaxation). Right Ventricle: The right ventricular size is normal. No increase in right ventricular wall thickness. Right ventricular systolic function is mildly reduced. There is normal pulmonary artery systolic pressure. The tricuspid regurgitant velocity is 2.55 m/s, and with an assumed right atrial pressure of 8 mmHg, the estimated right ventricular systolic pressure is 29.5 mmHg. Left Atrium: Left atrial size was normal in size. Right Atrium: Right atrial size was mildly dilated. Pericardium: Trivial pericardial effusion is present. Mitral Valve: The mitral valve is normal in structure. Mild mitral valve regurgitation. No evidence of mitral valve  stenosis. Tricuspid Valve: The tricuspid valve is normal in structure. Tricuspid valve regurgitation is mild to moderate. Aortic Valve: The aortic valve is tricuspid. There is mild calcification of the aortic valve.

## 2022-02-10 NOTE — Progress Notes (Signed)
?PROGRESS NOTE ? ? ? ?Daniel Fuentes  CHY:[id number] DOB: April 23, 1951 DOA: 02/08/2022 ?PCP: Romualdo Bolk, FNP  ? ? ?Brief Narrative:  ?Daniel Fuentes is a 71 y.o. male with medical history significant of COPD, CAD, tobacco use, subdural hematoma, HTN, DM 2, HLD comes to the hospital with complaints of chest pain.  Patient states after dinner last night he had some epigastric discomfort but went to sleep and when he woke up this morning and went to work while climbing his stairs he felt more than usual shortness of breath later developed some diaphoresis.  He continued to work through this and then went back to his car he felt lightheaded and dizzy and drove himself to the hospital for further evaluation.  Denies having similar symptoms in the past. ?  ?  ?In the ED patient was initially diaphoretic, elevated troponin.  EKG showed inferior lateral lead T wave inversion.  Cardiology team was consulted who recommended cardiac catheterization. ? ?5/14 no overnight issues.  Asking for stronger medication for sleep.  Also complaining that his food is not as salty as he would like it to be. ? ? ?Consultants:  ?Cardiology ? ?Procedures: Cardiac cath ? ?Antimicrobials:  ?  ? ? ?Subjective: ?No chest pain or shortness of breath ? ?Objective: ?Vitals:  ? 02/09/22 1700 02/09/22 1946 02/10/22 0016 02/10/22 0343  ?BP: (!) 150/65 (!) 171/80 (!) 135/95 (!) 154/91  ?Pulse:  67 (!) 54 64  ?Resp: '20 18 16 16  '$ ?Temp: 98.4 ?F (36.9 ?C) 97.8 ?F (36.6 ?C) 98.9 ?F (37.2 ?C) 98.5 ?F (36.9 ?C)  ?TempSrc: Oral  Oral Oral  ?SpO2: 98% 100% 100% 100%  ?Weight:      ?Height:      ? ? ?Intake/Output Summary (Last 24 hours) at 02/10/2022 1344 ?Last data filed at 02/10/2022 1010 ?Gross per 24 hour  ?Intake 673.1 ml  ?Output 1050 ml  ?Net -376.9 ml  ? ?Filed Weights  ? 02/08/22 0903 02/08/22 1900  ?Weight: 89.4 kg 81.1 kg  ? ? ?Examination: ?Calm, NAD ?Cta no w/r ?Reg s1/s2 no gallop ?Soft benign +bs ?No edema ?Aaoxox3  ?Mood and affect appropriate in  current setting  ? ? ?Data Reviewed: I have personally reviewed following labs and imaging studies ? ?CBC: ?Recent Labs  ?Lab 02/08/22 ?0916 02/09/22 ?0428 02/10/22 ?8786  ?WBC 6.6 6.6 5.7  ?NEUTROABS 2.3  --   --   ?HGB 13.4 12.8* 12.7*  ?HCT 40.9 38.4* 38.4*  ?MCV 89.3 86.5 87.7  ?PLT 286 249 231  ? ?Basic Metabolic Panel: ?Recent Labs  ?Lab 02/08/22 ?0916 02/09/22 ?0203 02/09/22 ?0428 02/10/22 ?7672  ?NA 138 135 137 136  ?K 3.5 3.3* 3.4* 4.0  ?CL 106 107 108 102  ?CO2 '23 24 23 26  '$ ?GLUCOSE 178* 121* 120* 180*  ?BUN '19 17 15 13  '$ ?CREATININE 1.31* 1.02 0.92 1.26*  ?CALCIUM 9.0 8.3* 8.5* 8.7*  ?MG  --  2.0  --  2.2  ? ?GFR: ?Estimated Creatinine Clearance: 53 mL/min (A) (by C-G formula based on SCr of 1.26 mg/dL (H)). ?Liver Function Tests: ?Recent Labs  ?Lab 02/08/22 ?0916  ?AST 43*  ?ALT 13  ?ALKPHOS 57  ?BILITOT 0.8  ?PROT 6.7  ?ALBUMIN 3.6  ? ?No results for input(s): LIPASE, AMYLASE in the last 168 hours. ?No results for input(s): AMMONIA in the last 168 hours. ?Coagulation Profile: ?Recent Labs  ?Lab 02/08/22 ?0916  ?INR 1.0  ? ?Cardiac Enzymes: ?No results for input(s): CKTOTAL, CKMB, CKMBINDEX, TROPONINI  in the last 168 hours. ?BNP (last 3 results) ?No results for input(s): PROBNP in the last 8760 hours. ?HbA1C: ?Recent Labs  ?  02/08/22 ?1844  ?HGBA1C 5.9*  ? ?CBG: ?Recent Labs  ?Lab 02/08/22 ?0908 02/08/22 ?2139  ?GLUCAP 185* 126*  ? ?Lipid Profile: ?Recent Labs  ?  02/08/22 ?1844  ?CHOL 181  ?HDL 48  ?LDLCALC 110*  ?TRIG 114  ?CHOLHDL 3.8  ? ?Thyroid Function Tests: ?No results for input(s): TSH, T4TOTAL, FREET4, T3FREE, THYROIDAB in the last 72 hours. ?Anemia Panel: ?No results for input(s): VITAMINB12, FOLATE, FERRITIN, TIBC, IRON, RETICCTPCT in the last 72 hours. ?Sepsis Labs: ?Recent Labs  ?Lab 02/08/22 ?0916 02/08/22 ?1844  ?LATICACIDVEN 1.8 1.2  ? ? ?Recent Results (from the past 240 hour(s))  ?Blood culture (routine x 2)     Status: None (Preliminary result)  ? Collection Time: 02/08/22  9:16 AM  ?  Specimen: BLOOD  ?Result Value Ref Range Status  ? Specimen Description BLOOD BLOOD RIGHT FOREARM  Final  ? Special Requests   Final  ?  BOTTLES DRAWN AEROBIC AND ANAEROBIC Blood Culture adequate volume  ? Culture   Final  ?  NO GROWTH 2 DAYS ?Performed at [facility], [address], [place], [place] [address] ?  ? Report Status PENDING  Incomplete  ?Blood culture (routine x 2)     Status: None (Preliminary result)  ? Collection Time: 02/08/22  9:16 AM  ? Specimen: BLOOD  ?Result Value Ref Range Status  ? Specimen Description BLOOD BLOOD LEFT HAND  Final  ? Special Requests   Final  ?  BOTTLES DRAWN AEROBIC AND ANAEROBIC Blood Culture adequate volume  ? Culture   Final  ?  NO GROWTH 2 DAYS ?Performed at [facility], [address], [place], [place] [address] ?  ? Report Status PENDING  Incomplete  ?  ? ? ? ? ? ?Radiology Studies: ?ECHOCARDIOGRAM COMPLETE ? ?Result Date: 02/09/2022 ?   ECHOCARDIOGRAM REPORT   Patient Name:   Daniel Fuentes Date of Exam: 02/09/2022 Medical Rec #:  [id number]     Height:       69.0 in Accession #:    [id number]    Weight:       178.7 lb Date of Birth:  May 14, 1951      BSA:          1.969 m? Patient Age:    74 years      BP:           160/82 mmHg Patient Gender: M             HR:           68 bpm. Exam Location:  [facility] Procedure: 2D Echo Indications:     NSTEMI  History:         Patient has no prior history of Echocardiogram examinations.                  CAD, COPD; Risk Factors:Hypertension, Diabetes, CKD II and                  Current Smoker.  Sonographer:     L Thornton-Maynard Referring Phys:  [id number] CHRISTOPHER END Diagnosing Phys: Nelva Bush MD IMPRESSIONS  1. Left ventricular ejection fraction, by estimation, is 50 to 55%. The left ventricle has low normal function. The left ventricle demonstrates regional wall motion abnormalities (see scoring diagram/findings for description). There is moderate left ventricular hypertrophy. Left ventricular diastolic  parameters are consistent with Grade I diastolic dysfunction (impaired relaxation). There is mild hypokinesis of the left ventricular, basal-mid inferior wall and inferoseptal wall.  2. Right ventricular systolic function is mildly reduced. The right ventricular size is normal. There is normal pulmonary artery systolic pressure.  3. Right atrial size was mildly dilated.  4. The mitral valve is normal in structure. Mild mitral valve regurgitation. No evidence of mitral stenosis.  5. Tricuspid valve regurgitation is mild to moderate.  6. The aortic valve is tricuspid. There is mild calcification of the aortic valve. There is moderate thickening of the aortic valve. Aortic valve regurgitation is not visualized. Aortic valve sclerosis/calcification is present, without any evidence of aortic stenosis.  7. The inferior vena cava is dilated in size with >50% respiratory variability, suggesting right atrial pressure of 8 mmHg. FINDINGS  Left Ventricle: Left ventricular ejection fraction, by estimation, is 50 to 55%. The left ventricle has low normal function. The left ventricle demonstrates regional wall motion abnormalities. Mild hypokinesis of the left ventricular, basal-mid inferior  wall and inferoseptal wall. The left ventricular internal cavity size was normal in size. There is moderate left ventricular hypertrophy. Left ventricular diastolic parameters are consistent with Grade I diastolic dysfunction (impaired relaxation). Right Ventricle: The right ventricular size is normal. No increase in right ventricular wall thickness. Right ventricular systolic function is mildly reduced. There is normal pulmonary artery systolic pressure. The tricuspid regurgitant velocity is 2.55 m/s, and with an assumed right atrial pressure of 8 mmHg, the estimated right ventricular systolic pressure is 01.0 mmHg. Left Atrium: Left atrial size was normal in size. Right Atrium: Right atrial size was mildly dilated. Pericardium: Trivial  pericardial effusion is present. Mitral Valve: The mitral valve is normal in structure. Mild mitral valve regurgitation. No evidence of mitral valve stenosis. Tricuspid Valve: The tricuspid valve is normal in st

## 2022-02-10 NOTE — Consult Note (Signed)
ANTICOAGULATION CONSULT NOTE  ? ?Pharmacy Consult for heparin infusion ?Indication: chest pain/ACS ? ?Allergies  ?Allergen Reactions  ? Simvastatin Other (See Comments)  ?  Muscle aches  ? Statins Other (See Comments)  ?  Joint pain   ? ? ?Patient Measurements: ?Height: '5\' 9"'$  (175.3 cm) ?Weight: 81.1 kg (178 lb 11.2 oz) ?IBW/kg (Calculated) : 70.7 ?Heparin Dosing Weight: 88.7 kg ? ?Vital Signs: ?Temp: 98.5 ?F (36.9 ?C) (05/14 0343) ?Temp Source: Oral (05/14 0343) ?BP: 154/91 (05/14 0343) ?Pulse Rate: 64 (05/14 0343) ? ?Labs: ?Recent Labs  ?  02/08/22 ?0916 02/08/22 ?1844 02/08/22 ?1844 02/09/22 ?0203 02/09/22 ?9528 02/09/22 ?4132 02/09/22 ?1059 02/09/22 ?2051 02/10/22 ?4401  ?HGB 13.4  --   --   --  12.8*  --   --   --  12.7*  ?HCT 40.9  --   --   --  38.4*  --   --   --  38.4*  ?PLT 286  --   --   --  249  --   --   --  231  ?APTT 22*  --   --   --   --   --   --   --   --   ?LABPROT 13.4  --   --   --   --   --   --   --   --   ?INR 1.0  --   --   --   --   --   --   --   --   ?HEPARINUNFRC  --  0.57   < > 0.29*  --   --  0.24* 0.44 0.45  ?CREATININE 1.31*  --   --  1.02 0.92  --   --   --  1.26*  ?TROPONINIHS 966* [id number]*  --   --   --  10,077*  --   --   --   ? < > = values in this interval not displayed.  ? ? ? ?Estimated Creatinine Clearance: 53 mL/min (A) (by C-G formula based on SCr of 1.26 mg/dL (H)). ? ? ?Medical History: ?Past Medical History:  ?Diagnosis Date  ? CKD (chronic kidney disease), stage II   ? Diabetes mellitus without complication ([place])   ? Hyperlipidemia   ? Hypertension   ? ? ?Medications:  ?Scheduled:  ? amLODipine  10 mg Oral Daily  ? aspirin  81 mg Oral Daily  ? clopidogrel  75 mg Oral Q breakfast  ? ezetimibe  10 mg Oral Daily  ? metoprolol tartrate  12.5 mg Oral BID  ? sodium chloride flush  3 mL Intravenous Q12H  ? sodium chloride flush  3 mL Intravenous Q12H  ? sodium chloride flush  3 mL Intravenous Q12H  ? ? ?Assessment: ?Patient with PMH relevant for CKD, diabetes, hypertension,  hyperlipidemia. Admitted with chest pain. Pharmacy consulted to manage heparin infusion for ACS/STEMI. ? ?5/13 0203 HL 0.29  ?5/13 1059 HL 0.24   subthera,  will order bolus and increase drip from 1350 u/hr to 1500 u/hr ?5/13 2051 HL 0.44   therapeutic  ?5/14 0525 HL 0.45  ? ?Goal of Therapy:  ?Heparin level 0.3-0.7 units/ml ?Monitor platelets by anticoagulation protocol: Yes ?  ?Plan:  ?Heparin level is therapeutic. Will continue heparin at 1500 u/hr. Recheck heparin level and CBC with AM labs. ? ?Oswald Hillock, PharmD ?Clinical Pharmacist  ?02/10/2022 6:12 AM  ? ? ? ? ?

## 2022-02-11 ENCOUNTER — Encounter: Payer: Self-pay | Admitting: Internal Medicine

## 2022-02-11 ENCOUNTER — Encounter: Payer: Self-pay | Admitting: Registered Nurse

## 2022-02-11 ENCOUNTER — Encounter
Admission: EM | Disposition: A | Discharge status: Home / Self Care | Payer: Self-pay | Source: Home / Self Care | Attending: Internal Medicine

## 2022-02-11 ENCOUNTER — Other Ambulatory Visit: Payer: Self-pay

## 2022-02-11 ENCOUNTER — Other Ambulatory Visit (HOSPITAL_COMMUNITY): Payer: Self-pay

## 2022-02-11 DIAGNOSIS — E785 Hyperlipidemia, unspecified: Secondary | ICD-10-CM

## 2022-02-11 DIAGNOSIS — I214 Non-ST elevation (NSTEMI) myocardial infarction: Secondary | ICD-10-CM | POA: Diagnosis not present

## 2022-02-11 HISTORY — PX: CORONARY STENT INTERVENTION: CATH118234

## 2022-02-11 LAB — CBC
HCT: 38 % — ABNORMAL LOW (ref 39.0–52.0)
Hemoglobin: 12.5 g/dL — ABNORMAL LOW (ref 13.0–17.0)
MCH: 29.1 pg (ref 26.0–34.0)
MCHC: 32.9 g/dL (ref 30.0–36.0)
MCV: 88.6 fL (ref 80.0–100.0)
Platelets: 233 10*3/uL (ref 150–400)
RBC: 4.29 MIL/uL (ref 4.22–5.81)
RDW: 14.7 % (ref 11.5–15.5)
WBC: 5.2 10*3/uL (ref 4.0–10.5)
nRBC: 0 % (ref 0.0–0.2)

## 2022-02-11 LAB — MAGNESIUM: Magnesium: 2.1 mg/dL (ref 1.7–2.4)

## 2022-02-11 LAB — LIPOPROTEIN A (LPA): Lipoprotein (a): 225.4 nmol/L — ABNORMAL HIGH (ref <75.0)

## 2022-02-11 LAB — URINALYSIS, COMPLETE (UACMP) WITH MICROSCOPIC
Bacteria, UA: NONE SEEN
Bilirubin Urine: NEGATIVE
Glucose, UA: NEGATIVE mg/dL
Hgb urine dipstick: NEGATIVE
Ketones, ur: NEGATIVE mg/dL
Leukocytes,Ua: NEGATIVE
Nitrite: NEGATIVE
Protein, ur: NEGATIVE mg/dL
Specific Gravity, Urine: 1.01 (ref 1.005–1.030)
Squamous Epithelial / HPF: NONE SEEN (ref 0–5)
pH: 7 (ref 5.0–8.0)

## 2022-02-11 LAB — BASIC METABOLIC PANEL
Anion gap: 7 (ref 5–15)
BUN: 16 mg/dL (ref 8–23)
CO2: 25 mmol/L (ref 22–32)
Calcium: 8.8 mg/dL — ABNORMAL LOW (ref 8.9–10.3)
Chloride: 106 mmol/L (ref 98–111)
Creatinine, Ser: 0.97 mg/dL (ref 0.61–1.24)
GFR, Estimated: >60 mL/min (ref >60)
Glucose, Bld: 121 mg/dL — ABNORMAL HIGH (ref 70–99)
Potassium: 3.8 mmol/L (ref 3.5–5.1)
Sodium: 138 mmol/L (ref 135–145)

## 2022-02-11 LAB — POCT ACTIVATED CLOTTING TIME
Activated Clotting Time: 317 seconds
Activated Clotting Time: 281 seconds
Activated Clotting Time: 323 seconds

## 2022-02-11 LAB — GLUCOSE, CAPILLARY
Glucose-Capillary: 131 mg/dL — ABNORMAL HIGH (ref 70–99)
Glucose-Capillary: 130 mg/dL — ABNORMAL HIGH (ref 70–99)

## 2022-02-11 LAB — HEPARIN LEVEL (UNFRACTIONATED): Heparin Unfractionated: 0.48 IU/mL (ref 0.30–0.70)

## 2022-02-11 SURGERY — CORONARY STENT INTERVENTION
Anesthesia: Moderate Sedation

## 2022-02-11 MED ORDER — SODIUM CHLORIDE 0.9 % IV SOLN: 250.0000 mL | INTRAVENOUS | Status: DC | PRN

## 2022-02-11 MED ORDER — HEPARIN SODIUM (PORCINE) 1000 UNIT/ML IJ SOLN
INTRAMUSCULAR | Status: AC
  Filled 2022-02-11: qty 10

## 2022-02-11 MED ORDER — IOHEXOL 300 MG/ML  SOLN
INTRAMUSCULAR | Status: DC | PRN
  Administered 2022-02-11: 170 mL

## 2022-02-11 MED ORDER — SODIUM CHLORIDE 0.9% FLUSH: 3.0000 mL | INTRAVENOUS | Status: DC | PRN

## 2022-02-11 MED ORDER — ASPIRIN 81 MG PO CHEW
CHEWABLE_TABLET | ORAL | Status: AC
  Filled 2022-02-11: qty 1

## 2022-02-11 MED ORDER — HEPARIN SODIUM (PORCINE) 1000 UNIT/ML IJ SOLN
INTRAMUSCULAR | Status: DC | PRN
Start: 2022-02-11 — End: 2022-02-11
  Administered 2022-02-11: 2000 [IU] via INTRAVENOUS
  Administered 2022-02-11: 8000 [IU] via INTRAVENOUS

## 2022-02-11 MED ORDER — SODIUM CHLORIDE 0.9 % WEIGHT BASED INFUSION: 1.0000 mL/kg/h | INTRAVENOUS | Status: DC

## 2022-02-11 MED ORDER — CLOPIDOGREL BISULFATE 75 MG PO TABS
ORAL_TABLET | ORAL | Status: DC | PRN
  Administered 2022-02-11: 300 mg via ORAL

## 2022-02-11 MED ORDER — LIDOCAINE HCL 1 % IJ SOLN
INTRAMUSCULAR | Status: AC
  Filled 2022-02-11: qty 20

## 2022-02-11 MED ORDER — CARVEDILOL 6.25 MG PO TABS
6.2500 mg | ORAL_TABLET | Freq: Two times a day (BID) | ORAL | Status: DC
Start: 2022-02-11 — End: 2022-02-12
  Administered 2022-02-11 – 2022-02-12 (×2): 6.25 mg via ORAL
  Filled 2022-02-11 (×4): qty 1

## 2022-02-11 MED ORDER — FENTANYL CITRATE (PF) 100 MCG/2ML IJ SOLN
INTRAMUSCULAR | Status: DC | PRN
  Administered 2022-02-11: 25 ug via INTRAVENOUS

## 2022-02-11 MED ORDER — HEPARIN (PORCINE) IN NACL 1000-0.9 UT/500ML-% IV SOLN
INTRAVENOUS | Status: AC
  Filled 2022-02-11: qty 1000

## 2022-02-11 MED ORDER — FENTANYL CITRATE (PF) 100 MCG/2ML IJ SOLN
INTRAMUSCULAR | Status: AC
  Filled 2022-02-11: qty 2

## 2022-02-11 MED ORDER — SODIUM CHLORIDE 0.9 % WEIGHT BASED INFUSION: 3.0000 mL/kg/h | INTRAVENOUS | Status: DC

## 2022-02-11 MED ORDER — MIDAZOLAM HCL 2 MG/2ML IJ SOLN
INTRAMUSCULAR | Status: DC | PRN
  Administered 2022-02-11: 1 mg via INTRAVENOUS

## 2022-02-11 MED ORDER — VERAPAMIL HCL 2.5 MG/ML IV SOLN
INTRAVENOUS | Status: AC
  Filled 2022-02-11: qty 2

## 2022-02-11 MED ORDER — HYDRALAZINE HCL 20 MG/ML IJ SOLN
INTRAMUSCULAR | Status: AC
  Filled 2022-02-11: qty 1

## 2022-02-11 MED ORDER — ONDANSETRON HCL 4 MG/2ML IJ SOLN: 4.0000 mg | Freq: Four times a day (QID) | INTRAMUSCULAR | Status: DC | PRN

## 2022-02-11 MED ORDER — CLOPIDOGREL BISULFATE 75 MG PO TABS
ORAL_TABLET | ORAL | Status: AC
  Filled 2022-02-11: qty 4

## 2022-02-11 MED ORDER — SODIUM CHLORIDE 0.9% FLUSH
3.0000 mL | Freq: Two times a day (BID) | INTRAVENOUS | Status: DC
  Administered 2022-02-12: 3 mL via INTRAVENOUS

## 2022-02-11 MED ORDER — ASPIRIN 81 MG PO CHEW: 81.0000 mg | CHEWABLE_TABLET | ORAL | Status: DC

## 2022-02-11 MED ORDER — NITROGLYCERIN 1 MG/10 ML FOR IR/CATH LAB
INTRA_ARTERIAL | Status: DC | PRN
  Administered 2022-02-11: 200 ug via INTRACORONARY

## 2022-02-11 MED ORDER — HEPARIN (PORCINE) IN NACL 1000-0.9 UT/500ML-% IV SOLN
INTRAVENOUS | Status: DC | PRN
Start: 2022-02-11 — End: 2022-02-11
  Administered 2022-02-11 (×2): 500 mL

## 2022-02-11 MED ORDER — SODIUM CHLORIDE 0.9 % WEIGHT BASED INFUSION
1.0000 mL/kg/h | INTRAVENOUS | Status: AC
  Administered 2022-02-11: 1 mL/kg/h via INTRAVENOUS

## 2022-02-11 MED ORDER — LIDOCAINE HCL (PF) 1 % IJ SOLN
INTRAMUSCULAR | Status: DC | PRN
  Administered 2022-02-11: 2 mL

## 2022-02-11 MED ORDER — MIDAZOLAM HCL 2 MG/2ML IJ SOLN
INTRAMUSCULAR | Status: AC
  Filled 2022-02-11: qty 2

## 2022-02-11 MED ORDER — VERAPAMIL HCL 2.5 MG/ML IV SOLN
INTRAVENOUS | Status: DC | PRN
  Administered 2022-02-11: 2.5 mg via INTRA_ARTERIAL

## 2022-02-11 SURGICAL SUPPLY — 28 items
BALLN TREK RX 2.5X15 (BALLOONS) ×4
BALLN ~~LOC~~ EUPHORA RX 3.25X20 (BALLOONS) ×2
BALLN ~~LOC~~ TREK NEO RX 3.25X15 (BALLOONS) ×1 IMPLANT
BALLN ~~LOC~~ TREK NEO RX 3.5X15 (BALLOONS) ×2
BALLN ~~LOC~~ TREK RX 4.0X8 (BALLOONS) ×2
BALLOON TREK RX 2.5X15 (BALLOONS) IMPLANT
BALLOON ~~LOC~~ EUPHORA RX 3.25X20 (BALLOONS) IMPLANT
BALLOON ~~LOC~~ TREK NEO RX 3.5X15 (BALLOONS) IMPLANT
BALLOON ~~LOC~~ TREK RX 4.0X8 (BALLOONS) IMPLANT
CATH LAUNCHER 6FR EBU3.5 (CATHETERS) ×1 IMPLANT
DEVICE RAD TR BAND REGULAR (VASCULAR PRODUCTS) ×1 IMPLANT
DRAPE BRACHIAL (DRAPES) ×1 IMPLANT
GLIDESHEATH SLEND SS 6F .021 (SHEATH) ×1 IMPLANT
GUIDEWIRE INQWIRE 1.5J.035X260 (WIRE) IMPLANT
INQWIRE 1.5J .035X260CM (WIRE) ×2
KIT ENCORE 26 ADVANTAGE (KITS) ×2 IMPLANT
KIT SYRINGE INJ CVI SPIKEX1 (MISCELLANEOUS) ×1 IMPLANT
PACK CARDIAC CATH (CUSTOM PROCEDURE TRAY) ×2 IMPLANT
PROTECTION STATION PRESSURIZED (MISCELLANEOUS) ×2
SET ATX SIMPLICITY (MISCELLANEOUS) ×1 IMPLANT
STATION PROTECTION PRESSURIZED (MISCELLANEOUS) IMPLANT
STENT ONYX FRONTIER 3.0X22 (Permanent Stent) ×1 IMPLANT
STENT ONYX FRONTIER 3.0X38 (Permanent Stent) ×1 IMPLANT
STENT ONYX FRONTIER 3.5X18 (Permanent Stent) ×1 IMPLANT
TUBING CIL FLEX 10 FLL-RA (TUBING) ×1 IMPLANT
WIRE ASAHI PROWATER 180CM (WIRE) ×1 IMPLANT
WIRE HI TORQ WHISPER MS 190CM (WIRE) ×1 IMPLANT
WIRE RUNTHROUGH .014X180CM (WIRE) ×1 IMPLANT

## 2022-02-11 NOTE — Progress Notes (Addendum)
?PROGRESS NOTE ? ? ? ?Daniel Fuentes  XLK:[id number] DOB: Mar 11, 1951 DOA: 02/08/2022 ?PCP: Romualdo Bolk, FNP  ? ? ?Brief Narrative:  ?Daniel Fuentes is a 71 y.o. male with medical history significant of COPD, CAD, tobacco use, subdural hematoma, HTN, DM 2, HLD comes to the hospital with complaints of chest pain.  Patient states after dinner last night he had some epigastric discomfort but went to sleep and when he woke up this morning and went to work while climbing his stairs he felt more than usual shortness of breath later developed some diaphoresis.  He continued to work through this and then went back to his car he felt lightheaded and dizzy and drove himself to the hospital for further evaluation.  Denies having similar symptoms in the past. ?  ?  ?In the ED patient was initially diaphoretic, elevated troponin.  EKG showed inferior lateral lead T wave inversion.  Cardiology team was consulted who recommended cardiac catheterization. ? ?5/14 no overnight issues.  Asking for stronger medication for sleep.  Also complaining that his food is not as salty as he would like it to be. ? ?5/15 s/p pci to OM and left circumflex ? ? ?Consultants:  ?Cardiology ? ?Procedures: Cardiac cath ? ?Antimicrobials:  ?  ? ? ?Subjective: ?No sob or cp ? ?Objective: ?Vitals:  ? 02/11/22 1400 02/11/22 1415 02/11/22 1430 02/11/22 1452  ?BP: (!) 152/79  (!) 156/84 (!) 149/78  ?Pulse: 63 66 63 61  ?Resp: '11 14 13   '$ ?Temp:    98.2 ?F (36.8 ?C)  ?TempSrc:    Oral  ?SpO2: 98% 100% 98%   ?Weight:      ?Height:      ? ? ?Intake/Output Summary (Last 24 hours) at 02/11/2022 1456 ?Last data filed at 02/11/2022 1120 ?Gross per 24 hour  ?Intake 564.15 ml  ?Output 1600 ml  ?Net -1035.85 ml  ? ?Filed Weights  ? 02/08/22 3664 02/08/22 1900 02/11/22 0714  ?Weight: 89.4 kg 81.1 kg 81 kg  ? ? ?Examination: ? ?Calm, NAD ?Cta no w/r ?Reg s1/s2 no gallop ?Soft benign +bs ?No edema ?Aaoxox3  ?Mood and affect appropriate in current setting  ? ? ?Data Reviewed:  I have personally reviewed following labs and imaging studies ? ?CBC: ?Recent Labs  ?Lab 02/08/22 ?0916 02/09/22 ?0428 02/10/22 ?4034 02/11/22 ?0414  ?WBC 6.6 6.6 5.7 5.2  ?NEUTROABS 2.3  --   --   --   ?HGB 13.4 12.8* 12.7* 12.5*  ?HCT 40.9 38.4* 38.4* 38.0*  ?MCV 89.3 86.5 87.7 88.6  ?PLT 286 249 231 233  ? ?Basic Metabolic Panel: ?Recent Labs  ?Lab 02/08/22 ?0916 02/09/22 ?0203 02/09/22 ?0428 02/10/22 ?7425 02/11/22 ?0414  ?NA 138 135 137 136 138  ?K 3.5 3.3* 3.4* 4.0 3.8  ?CL 106 107 108 102 106  ?CO2 '23 24 23 26 25  '$ ?GLUCOSE 178* 121* 120* 180* 121*  ?BUN '19 17 15 13 16  '$ ?CREATININE 1.31* 1.02 0.92 1.26* 0.97  ?CALCIUM 9.0 8.3* 8.5* 8.7* 8.8*  ?MG  --  2.0  --  2.2 2.1  ? ?GFR: ?Estimated Creatinine Clearance: 68.8 mL/min (by C-G formula based on SCr of 0.97 mg/dL). ?Liver Function Tests: ?Recent Labs  ?Lab 02/08/22 ?0916  ?AST 43*  ?ALT 13  ?ALKPHOS 57  ?BILITOT 0.8  ?PROT 6.7  ?ALBUMIN 3.6  ? ?No results for input(s): LIPASE, AMYLASE in the last 168 hours. ?No results for input(s): AMMONIA in the last 168 hours. ?Coagulation Profile: ?Recent Labs  ?  Lab 02/08/22 ?0916  ?INR 1.0  ? ?Cardiac Enzymes: ?No results for input(s): CKTOTAL, CKMB, CKMBINDEX, TROPONINI in the last 168 hours. ?BNP (last 3 results) ?No results for input(s): PROBNP in the last 8760 hours. ?HbA1C: ?Recent Labs  ?  02/08/22 ?1844  ?HGBA1C 5.9*  ? ?CBG: ?Recent Labs  ?Lab 02/08/22 ?0908 02/08/22 ?2139 02/11/22 ?3235 02/11/22 ?5732  ?GLUCAP 185* 126* 131* 130*  ? ?Lipid Profile: ?Recent Labs  ?  02/08/22 ?1844  ?CHOL 181  ?HDL 48  ?LDLCALC 110*  ?TRIG 114  ?CHOLHDL 3.8  ? ?Thyroid Function Tests: ?No results for input(s): TSH, T4TOTAL, FREET4, T3FREE, THYROIDAB in the last 72 hours. ?Anemia Panel: ?No results for input(s): VITAMINB12, FOLATE, FERRITIN, TIBC, IRON, RETICCTPCT in the last 72 hours. ?Sepsis Labs: ?Recent Labs  ?Lab 02/08/22 ?0916 02/08/22 ?1844  ?LATICACIDVEN 1.8 1.2  ? ? ?Recent Results (from the past 240 hour(s))  ?Blood  culture (routine x 2)     Status: None (Preliminary result)  ? Collection Time: 02/08/22  9:16 AM  ? Specimen: BLOOD  ?Result Value Ref Range Status  ? Specimen Description BLOOD BLOOD RIGHT FOREARM  Final  ? Special Requests   Final  ?  BOTTLES DRAWN AEROBIC AND ANAEROBIC Blood Culture adequate volume  ? Culture   Final  ?  NO GROWTH 3 DAYS ?Performed at [facility], [address] [place], [place] [address] ?  ? Report Status PENDING  Incomplete  ?Blood culture (routine x 2)     Status: None (Preliminary result)  ? Collection Time: 02/08/22  9:16 AM  ? Specimen: BLOOD  ?Result Value Ref Range Status  ? Specimen Description BLOOD BLOOD LEFT HAND  Final  ? Special Requests   Final  ?  BOTTLES DRAWN AEROBIC AND ANAEROBIC Blood Culture adequate volume  ? Culture   Final  ?  NO GROWTH 3 DAYS ?Performed at [facility], [address], [place], [place] [address] ?  ? Report Status PENDING  Incomplete  ?  ? ? ? ? ? ?Radiology Studies: ?CARDIAC CATHETERIZATION ? ?Result Date: 02/11/2022 ?  Mid LAD lesion is 25% stenosed.   Dist Cx-2 lesion is 60% stenosed.   Mid Cx lesion is 70% stenosed.   1st Diag lesion is 50% stenosed.   3rd Diag lesion is 90% stenosed.   2nd Mrg lesion is 80% stenosed.   Dist Cx-1 lesion is 95% stenosed.   A drug-eluting stent was successfully placed using a STENT ONYX FRONTIER 3.0X38.   A drug-eluting stent was successfully placed using a STENT ONYX FRONTIER 3.5X18.   A drug-eluting stent was successfully placed using a STENT ONYX FRONTIER 3.0X22.   Post intervention, there is a 0% residual stenosis.   Post intervention, there is a 0% residual stenosis.   Post intervention, there is a 0% residual stenosis.   Post intervention, there is a 0% residual stenosis. Successful complex bifurcation stenting of left circumflex/OM 2. Recommendations: Dual antiplatelet therapy for at least 1 year and ideally indefinitely. Aggressive treatment of risk factors.   ? ? ? ? ? ?Scheduled Meds: ?  amLODipine  10 mg Oral Daily  ? aspirin      ? aspirin  81 mg Oral Daily  ? carvedilol  6.25 mg Oral BID WC  ? clopidogrel  75 mg Oral Q breakfast  ? ezetimibe  10 mg Oral Daily  ? hydrALAZINE      ? sodium chloride flush  3 mL Intravenous Q12H  ?  sodium chloride flush  3 mL Intravenous Q12H  ? sodium chloride flush  3 mL Intravenous Q12H  ? sodium chloride flush  3 mL Intravenous Q12H  ? ?Continuous Infusions: ? sodium chloride    ? sodium chloride    ? sodium chloride 1 mL/kg/hr (02/11/22 1048)  ? lactated ringers Stopped (02/11/22 1050)  ? ? ?Assessment & Plan: ?  ?Principal Problem: ?  NSTEMI (non-ST elevated myocardial infarction) ([place]) ?Active Problems: ?  Controlled diabetes mellitus with nephropathy ([place]) ?  Hypertension ?  Smoker ?  Coronary artery calcification of native artery ?  Emphysema lung ([place]) ?  Mixed hyperlipidemia ?  Chest pain ?  Hypokalemia ? ? ?NSTEMI ?Cardiology following ?Troponin trending down continue heparin drip ?Continue aspirin, Zetia, Plavix ?5/15 PCI with 4 drug-eluting stent placement of the RCA on Friday ?Status post cath today with stenting of left circumflex and OM 2 dual antiplatelet therapy for at least 12 months and preferably indefinitely ?Aggressive risk factor modification and cardiac rehab ?Monitor overnight and hydrate ? ?  ?History of tobacco use/emphysema/COPD ?No acute exacerbation ? ? ?Diabetes mellitus type 2 with CKD stage IIa ?A1c 5.9 ?BG stable ? ?AKI ?Post cath. ?Started on ivf for gentle hydration  ?Hold outpt ARB for now ?5/15 continue IV fluids, check a.m. creatinine to make sure stable post cath ? ?Essential hypertension ?Continue amlodipine and metoprolol  ?IV hydralazine  ? ? ?Hyperlipidemia ?- Previously intolerant to statin per documentation ?On Zetia ? ? ? ? ?DVT prophylaxis: Heparin ?Code Status: Full ?Family Communication: none at bedside ?Disposition Plan:  ?Status is: Inpatient ?Remains inpatient appropriate because: IV treatment , monitoring  overnight to make sure AKI stable DC in a.m. if no issues ?  ? ? ? ? ? LOS: 3 days  ? ?Time spent: 35 min ? ? ? ?Nolberto Hanlon, MD ?[facility] ?Pager [phone] ? ?If 7PM-7AM, please contact night-coverag

## 2022-02-11 NOTE — Care Management Important Message (Signed)
Important Message ? ?Patient Details  ?Name: Daniel Fuentes ?MRN: [id number] ?Date of Birth: 01/15/1951 ? ? ?Medicare Important Message Given:  Yes ? ?Patient out of room upon time of visit.  Copy of Medicare IM left in room for reference. ? ? ?[name] ?02/11/2022, 10:36 AM ?

## 2022-02-11 NOTE — TOC Benefit Eligibility Note (Signed)
Patient Advocate Encounter ?  ?Received notification that prior authorization for  ?Repatha SureClick '140MG'$ /ML auto-injectors is required. ?  ?PA submitted on 02/11/2022 ?Key [id number] ?Status is pending ?   ? ? ? ?Lyndel Safe, CPhT ?Pharmacy Patient Advocate Specialist ?[facility] Patient Advocate Team ?Direct Number: [phone]  Fax: [phone]  ? ? ? ? ? ?  ?

## 2022-02-11 NOTE — TOC Benefit Eligibility Note (Signed)
Patient Advocate Encounter ? ?Prior Authorization for Repatha SureClick '140MG'$ /ML auto-injectors has been approved.   ? ? ?Effective dates: 02/11/2022 through 02/12/2023 ? ?Patients co-pay is $100.00.  ? ? ? ?Lyndel Safe, CPhT ?Pharmacy Patient Advocate Specialist ?[place] Patient Advocate Team ?Direct Number: [phone]  Fax: [phone]  ?

## 2022-02-11 NOTE — Interval H&P Note (Signed)
History and Physical Interval Note: ? ?02/11/2022 ?7:55 AM ? ?Daniel Fuentes  has presented today for surgery, with the diagnosis of nstemi.  The various methods of treatment have been discussed with the patient and family. After consideration of risks, benefits and other options for treatment, the patient has consented to  Procedure(s): ?LEFT HEART CATH AND CORONARY ANGIOGRAPHY (N/A) as a surgical intervention.  The patient's history has been reviewed, patient examined, no change in status, stable for surgery.  I have reviewed the patient's chart and labs.  Questions were answered to the patient's satisfaction.   ? ? ?Kathlyn [place] ? ? ?

## 2022-02-11 NOTE — Consult Note (Signed)
ANTICOAGULATION CONSULT NOTE  ? ?Pharmacy Consult for heparin infusion ?Indication: chest pain/ACS ? ?Allergies  ?Allergen Reactions  ? Simvastatin Other (See Comments)  ?  Muscle aches  ? Statins Other (See Comments)  ?  Joint pain   ? ? ?Patient Measurements: ?Height: '5\' 9"'$  (175.3 cm) ?Weight: 81.1 kg (178 lb 11.2 oz) ?IBW/kg (Calculated) : 70.7 ?Heparin Dosing Weight: 88.7 kg ? ?Vital Signs: ?Temp: 98 ?F (36.7 ?C) (05/15 0357) ?Temp Source: Oral (05/15 0357) ?BP: 130/76 (05/15 0357) ?Pulse Rate: 54 (05/15 0357) ? ?Labs: ?Recent Labs  ?  02/08/22 ?0916 02/08/22 ?1844 02/09/22 ?0203 02/09/22 ?3428 02/09/22 ?7681 02/09/22 ?1059 02/09/22 ?2051 02/10/22 ?1572 02/11/22 ?0414  ?HGB 13.4  --   --  12.8*  --   --   --  12.7* 12.5*  ?HCT 40.9  --   --  38.4*  --   --   --  38.4* 38.0*  ?PLT 286  --   --  249  --   --   --  231 233  ?APTT 22*  --   --   --   --   --   --   --   --   ?LABPROT 13.4  --   --   --   --   --   --   --   --   ?INR 1.0  --   --   --   --   --   --   --   --   ?HEPARINUNFRC  --  0.57   < >  --   --    < > 0.44 0.45 0.48  ?CREATININE 1.31*  --    < > 0.92  --   --   --  1.26* 0.97  ?TROPONINIHS 966* [id number]*  --   --  10,077*  --   --   --   --   ? < > = values in this interval not displayed.  ? ? ? ?Estimated Creatinine Clearance: 68.8 mL/min (by C-G formula based on SCr of 0.97 mg/dL). ? ? ?Medical History: ?Past Medical History:  ?Diagnosis Date  ? CKD (chronic kidney disease), stage II   ? Diabetes mellitus without complication ([place])   ? Hyperlipidemia   ? Hypertension   ? ? ?Medications:  ?Scheduled:  ? amLODipine  10 mg Oral Daily  ? aspirin  81 mg Oral Daily  ? clopidogrel  75 mg Oral Q breakfast  ? ezetimibe  10 mg Oral Daily  ? metoprolol tartrate  12.5 mg Oral BID  ? sodium chloride flush  3 mL Intravenous Q12H  ? sodium chloride flush  3 mL Intravenous Q12H  ? sodium chloride flush  3 mL Intravenous Q12H  ? ? ?Assessment: ?Patient with PMH relevant for CKD, diabetes, hypertension,  hyperlipidemia. Admitted with chest pain. Pharmacy consulted to manage heparin infusion for ACS/STEMI. ? ?5/13 0203 HL 0.29  ?5/13 1059 HL 0.24   subthera,  will order bolus and increase drip from 1350 u/hr to 1500 u/hr ?5/13 2051 HL 0.44   therapeutic  ?5/14 0525 HL 0.45  ?5/15 0414 HL 0.48  ? ? ?Goal of Therapy:  ?Heparin level 0.3-0.7 units/ml ?Monitor platelets by anticoagulation protocol: Yes ?  ?Plan:  ?Heparin level is therapeutic. Will continue heparin at 1500 u/hr. Recheck heparin level and CBC with AM labs. ? ?Oswald Hillock, PharmD ?Clinical Pharmacist  ?02/11/2022 5:49 AM  ? ? ? ? ?

## 2022-02-11 NOTE — Progress Notes (Signed)
? ?Progress Note ? ?Patient Name: Daniel Fuentes ?Date of Encounter: 02/11/2022 ? ?[facility] Cardiologist: Ida Rogue, MD  ? ?Subjective  ? ?He is chest pain-free.  He underwent complex bifurcation stenting of the left circumflex/OM 2 this morning via the right radial artery. ? ?Inpatient Medications  ?  ?Scheduled Meds: ? [MAR Hold] amLODipine  10 mg Oral Daily  ? aspirin      ? [MAR Hold] aspirin  81 mg Oral Daily  ? [START ON 02/12/2022] aspirin  81 mg Oral Pre-Cath  ? carvedilol  6.25 mg Oral BID WC  ? [MAR Hold] clopidogrel  75 mg Oral Q breakfast  ? [MAR Hold] ezetimibe  10 mg Oral Daily  ? hydrALAZINE      ? [MAR Hold] sodium chloride flush  3 mL Intravenous Q12H  ? [MAR Hold] sodium chloride flush  3 mL Intravenous Q12H  ? [MAR Hold] sodium chloride flush  3 mL Intravenous Q12H  ? sodium chloride flush  3 mL Intravenous Q12H  ? ?Continuous Infusions: ? [MAR Hold] sodium chloride    ? sodium chloride    ? sodium chloride    ? [START ON 02/12/2022] sodium chloride    ? Followed by  ? [START ON 02/12/2022] sodium chloride    ? sodium chloride    ? lactated ringers 50 mL/hr at 02/11/22 7867  ? ?PRN Meds: ?[MAR Hold] sodium chloride, sodium chloride, sodium chloride, [MAR Hold] acetaminophen, [MAR Hold] dextromethorphan-guaiFENesin, [MAR Hold] hydrALAZINE, [MAR Hold] ipratropium-albuterol, [MAR Hold] melatonin, [MAR Hold] metoprolol tartrate, [MAR Hold] nitroGLYCERIN, ondansetron (ZOFRAN) IV, [MAR Hold] oxyCODONE, [MAR Hold] senna-docusate, [MAR Hold] sodium chloride flush, sodium chloride flush, sodium chloride flush, [MAR Hold] traZODone  ? ?Vital Signs  ?  ?Vitals:  ? 02/11/22 0748 02/11/22 0945 02/11/22 1000 02/11/22 1026  ?BP:  (!) 192/103 (!) 165/91 (!) 194/89  ?Pulse:  (!) 53 (!) 55   ?Resp:  15 17   ?Temp:      ?TempSrc:      ?SpO2: 100%     ?Weight:      ?Height:      ? ? ?Intake/Output Summary (Last 24 hours) at 02/11/2022 1027 ?Last data filed at 02/11/2022 1007 ?Gross per 24 hour  ?Intake 564.15 ml   ?Output 1200 ml  ?Net -635.85 ml  ? ? ? ?  02/11/2022  ?  7:14 AM 02/08/2022  ?  7:00 PM 02/08/2022  ?  9:03 AM  ?Last 3 Weights  ?Weight (lbs) 178 lb 9.2 oz 178 lb 11.2 oz 197 lb  ?Weight (kg) 81 kg 81.058 kg 89.359 kg  ?   ? ?Physical Exam  ? ?GEN: No acute distress.   ?Neck: No JVD ?Cardiac: RRR, no murmurs, rubs, or gallops.  ?Respiratory: Clear to auscultation bilaterally. ?GI: Soft, nontender, non-distended  ?MS: No edema; No deformity. ?Neuro:  Nonfocal  ?Psych: Normal affect  ? ?Labs  ?  ?High Sensitivity Troponin:   ?Recent Labs  ?Lab 02/08/22 ?0916 02/08/22 ?1844 02/09/22 ?6720  ?TROPONINIHS 966* [id number]* 10,077*  ? ?   ?Chemistry ?Recent Labs  ?Lab 02/08/22 ?0916 02/09/22 ?0203 02/09/22 ?0428 02/10/22 ?9470 02/11/22 ?0414  ?NA 138 135 137 136 138  ?K 3.5 3.3* 3.4* 4.0 3.8  ?CL 106 107 108 102 106  ?CO2 '23 24 23 26 25  '$ ?GLUCOSE 178* 121* 120* 180* 121*  ?BUN '19 17 15 13 16  '$ ?CREATININE 1.31* 1.02 0.92 1.26* 0.97  ?CALCIUM 9.0 8.3* 8.5* 8.7* 8.8*  ?MG  --  2.0  --  2.2 2.1  ?PROT 6.7  --   --   --   --   ?ALBUMIN 3.6  --   --   --   --   ?AST 43*  --   --   --   --   ?ALT 13  --   --   --   --   ?ALKPHOS 57  --   --   --   --   ?BILITOT 0.8  --   --   --   --   ?GFRNONAA 58* >60 >60 >60 >60  ?ANIONGAP 9 4* '6 8 7  '$ ? ?  ?Lipids  ?Recent Labs  ?Lab 02/08/22 ?1844  ?CHOL 181  ?TRIG 114  ?HDL 48  ?LDLCALC 110*  ?CHOLHDL 3.8  ? ?  ?Hematology ?Recent Labs  ?Lab 02/09/22 ?0428 02/10/22 ?4742 02/11/22 ?0414  ?WBC 6.6 5.7 5.2  ?RBC 4.44 4.38 4.29  ?HGB 12.8* 12.7* 12.5*  ?HCT 38.4* 38.4* 38.0*  ?MCV 86.5 87.7 88.6  ?MCH 28.8 29.0 29.1  ?MCHC 33.3 33.1 32.9  ?RDW 14.4 14.7 14.7  ?PLT 249 231 233  ? ? ?Thyroid No results for input(s): TSH, FREET4 in the last 168 hours.  ?BNP ?Recent Labs  ?Lab 02/08/22 ?0918  ?BNP 137.6*  ? ?  ?DDimer No results for input(s): DDIMER in the last 168 hours.  ? ?Radiology  ?  ?CARDIAC CATHETERIZATION ? ?Result Date: 02/11/2022 ?  Mid LAD lesion is 25% stenosed.   Dist Cx-2 lesion is 60%  stenosed.   Mid Cx lesion is 70% stenosed.   1st Diag lesion is 50% stenosed.   3rd Diag lesion is 90% stenosed.   2nd Mrg lesion is 80% stenosed.   Dist Cx-1 lesion is 95% stenosed.   A drug-eluting stent was successfully placed using a STENT ONYX FRONTIER 3.0X38.   A drug-eluting stent was successfully placed using a STENT ONYX FRONTIER 3.5X18.   A drug-eluting stent was successfully placed using a STENT ONYX FRONTIER 3.0X22.   Post intervention, there is a 0% residual stenosis.   Post intervention, there is a 0% residual stenosis.   Post intervention, there is a 0% residual stenosis.   Post intervention, there is a 0% residual stenosis. Successful complex bifurcation stenting of left circumflex/OM 2. Recommendations: Dual antiplatelet therapy for at least 1 year and ideally indefinitely. Aggressive treatment of risk factors.  ? ?ECHOCARDIOGRAM COMPLETE ? ?Result Date: 02/09/2022 ?   ECHOCARDIOGRAM REPORT   Patient Name:   Daniel Fuentes Date of Exam: 02/09/2022 Medical Rec #:  [id number]     Height:       69.0 in Accession #:    [id number]    Weight:       178.7 lb Date of Birth:  September 05, 1951      BSA:          1.969 m? Patient Age:    71 years      BP:           160/82 mmHg Patient Gender: M             HR:           68 bpm. Exam Location:  [facility] Procedure: 2D Echo Indications:     NSTEMI  History:         Patient has no prior history of Echocardiogram examinations.                  CAD, COPD;  Risk Factors:Hypertension, Diabetes, CKD II and                  Current Smoker.  Sonographer:     L Thornton-Maynard Referring Phys:  [id number] CHRISTOPHER END Diagnosing Phys: Nelva Bush MD IMPRESSIONS  1. Left ventricular ejection fraction, by estimation, is 50 to 55%. The left ventricle has low normal function. The left ventricle demonstrates regional wall motion abnormalities (see scoring diagram/findings for description). There is moderate left ventricular hypertrophy. Left ventricular diastolic parameters are consistent  with Grade I diastolic dysfunction (impaired relaxation). There is mild hypokinesis of the left ventricular, basal-mid inferior wall and inferoseptal wall.  2. Right ventricular systolic function is mildly reduced. The right ventricular size is normal. There is normal pulmonary artery systolic pressure.  3. Right atrial size was mildly dilated.  4. The mitral valve is normal in structure. Mild mitral valve regurgitation. No evidence of mitral stenosis.  5. Tricuspid valve regurgitation is mild to moderate.  6. The aortic valve is tricuspid. There is mild calcification of the aortic valve. There is moderate thickening of the aortic valve. Aortic valve regurgitation is not visualized. Aortic valve sclerosis/calcification is present, without any evidence of aortic stenosis.  7. The inferior vena cava is dilated in size with >50% respiratory variability, suggesting right atrial pressure of 8 mmHg. FINDINGS  Left Ventricle: Left ventricular ejection fraction, by estimation, is 50 to 55%. The left ventricle has low normal function. The left ventricle demonstrates regional wall motion abnormalities. Mild hypokinesis of the left ventricular, basal-mid inferior  wall and inferoseptal wall. The left ventricular internal cavity size was normal in size. There is moderate left ventricular hypertrophy. Left ventricular diastolic parameters are consistent with Grade I diastolic dysfunction (impaired relaxation). Right Ventricle: The right ventricular size is normal. No increase in right ventricular wall thickness. Right ventricular systolic function is mildly reduced. There is normal pulmonary artery systolic pressure. The tricuspid regurgitant velocity is 2.55 m/s, and with an assumed right atrial pressure of 8 mmHg, the estimated right ventricular systolic pressure is 62.8 mmHg. Left Atrium: Left atrial size was normal in size. Right Atrium: Right atrial size was mildly dilated. Pericardium: Trivial pericardial effusion is  present. Mitral Valve: The mitral valve is normal in structure. Mild mitral valve regurgitation. No evidence of mitral valve stenosis. Tricuspid Valve: The tricuspid valve is normal in structure. Tricuspid valve

## 2022-02-12 ENCOUNTER — Telehealth: Payer: Self-pay | Admitting: Internal Medicine

## 2022-02-12 ENCOUNTER — Other Ambulatory Visit: Payer: Self-pay

## 2022-02-12 ENCOUNTER — Encounter: Payer: Self-pay | Admitting: Cardiovascular Disease

## 2022-02-12 DIAGNOSIS — I214 Non-ST elevation (NSTEMI) myocardial infarction: Secondary | ICD-10-CM | POA: Diagnosis not present

## 2022-02-12 DIAGNOSIS — Z955 Presence of coronary angioplasty implant and graft: Secondary | ICD-10-CM

## 2022-02-12 LAB — CBC
HCT: 37.3 % — ABNORMAL LOW (ref 39.0–52.0)
Hemoglobin: 12.4 g/dL — ABNORMAL LOW (ref 13.0–17.0)
MCH: 29.2 pg (ref 26.0–34.0)
MCHC: 33.2 g/dL (ref 30.0–36.0)
MCV: 87.8 fL (ref 80.0–100.0)
Platelets: 248 10*3/uL (ref 150–400)
RBC: 4.25 MIL/uL (ref 4.22–5.81)
RDW: 14.5 % (ref 11.5–15.5)
WBC: 6.4 10*3/uL (ref 4.0–10.5)
nRBC: 0 % (ref 0.0–0.2)

## 2022-02-12 LAB — BASIC METABOLIC PANEL
Anion gap: 7 (ref 5–15)
BUN: 16 mg/dL (ref 8–23)
CO2: 22 mmol/L (ref 22–32)
Calcium: 8.7 mg/dL — ABNORMAL LOW (ref 8.9–10.3)
Chloride: 106 mmol/L (ref 98–111)
Creatinine, Ser: 1.07 mg/dL (ref 0.61–1.24)
GFR, Estimated: >60 mL/min (ref >60)
Glucose, Bld: 113 mg/dL — ABNORMAL HIGH (ref 70–99)
Potassium: 3.7 mmol/L (ref 3.5–5.1)
Sodium: 135 mmol/L (ref 135–145)

## 2022-02-12 LAB — MAGNESIUM: Magnesium: 2.2 mg/dL (ref 1.7–2.4)

## 2022-02-12 MED ORDER — METFORMIN HCL 1000 MG PO TABS: 1000.0000 mg | ORAL_TABLET | Freq: Every day | ORAL | Status: AC

## 2022-02-12 MED ORDER — NITROGLYCERIN 0.4 MG SL SUBL: 0.4000 mg | SUBLINGUAL_TABLET | SUBLINGUAL | 0 refills | Status: AC | PRN

## 2022-02-12 MED ORDER — LOSARTAN POTASSIUM 25 MG PO TABS: 25.0000 mg | ORAL_TABLET | Freq: Every day | ORAL | 0 refills | Status: DC

## 2022-02-12 MED ORDER — EZETIMIBE 10 MG PO TABS
10.0000 mg | ORAL_TABLET | Freq: Every day | ORAL | 0 refills | Status: DC
Start: 2022-02-13 — End: 2022-02-19

## 2022-02-12 MED ORDER — CARVEDILOL 6.25 MG PO TABS
6.2500 mg | ORAL_TABLET | Freq: Two times a day (BID) | ORAL | 0 refills | Status: DC
Start: 2022-02-12 — End: 2022-02-19

## 2022-02-12 MED ORDER — REPATHA SURECLICK 140 MG/ML ~~LOC~~ SOAJ
140.0000 mg | SUBCUTANEOUS | 0 refills | Status: DC
Start: 2022-02-12 — End: 2022-02-19
  Filled 2022-02-12 (×3): qty 2, 28d supply, fill #0

## 2022-02-12 MED ORDER — CLOPIDOGREL BISULFATE 75 MG PO TABS: 75.0000 mg | ORAL_TABLET | Freq: Every day | ORAL | 0 refills | Status: DC

## 2022-02-12 MED ORDER — AMLODIPINE BESYLATE 10 MG PO TABS: 10.0000 mg | ORAL_TABLET | Freq: Every day | ORAL | 0 refills | Status: DC

## 2022-02-12 MED ORDER — LOSARTAN POTASSIUM 25 MG PO TABS
25.0000 mg | ORAL_TABLET | Freq: Every day | ORAL | Status: DC
  Administered 2022-02-12: 25 mg via ORAL
  Filled 2022-02-12: qty 1

## 2022-02-12 NOTE — Telephone Encounter (Signed)
Scheduled

## 2022-02-12 NOTE — Telephone Encounter (Signed)
Attempted to schedule with End .  LMOV to call office. ? ?

## 2022-02-12 NOTE — Discharge Summary (Signed)
Daniel Fuentes JKK:[id number] DOB: 1951/03/14 DOA: 02/08/2022 ? ?PCP: Romualdo Bolk, FNP ? ?Admit date: 02/08/2022 ?Discharge date: 02/12/2022 ? ?Admitted From: home ?Disposition:  home ? ?Recommendations for Outpatient Follow-up:  ?Follow up with PCP in 1 week ?Please obtain BMP/CBC in one week ?Please follow up with cardiology in 2 weeks ? ?  ? ? ?Discharge Condition:Stable ?CODE STATUS:Full  ?Diet recommendation: Heart Healthy  ?Brief/Interim Summary: ?Per HPI: Daniel Fuentes is a 71 y.o. male with medical history significant of COPD, CAD, tobacco use, subdural hematoma, HTN, DM 2, HLD comes to the hospital with complaints of chest pain.  Patient states after dinner last night he had some epigastric discomfort but went to sleep and when he woke up this morning and went to work while climbing his stairs he felt more than usual shortness of breath later developed some diaphoresis.  He continued to work through this and then went back to his car he felt lightheaded and dizzy and drove himself to the hospital for further evaluation.In the ED patient was initially diaphoretic, elevated troponin.  EKG showed inferior lateral lead T wave inversion.  Cardiology team was consulted who recommended cardiac catheterization.He under went cardiac cath initially with Successful PCI of proximal through distal RCA and rPLA on 5/12. Then he underwent s/p 2nd staged cath/PCI  Successful complex bifurcation stenting of left circumflex/OM 2 ? ? ? ?  ?NSTEMI ?Cardiology following ?Troponin had elevated was started on heparin drip initially ?Status post PCI as above ?Continue aspirin, Zetia, Plavix and Repatha was given ?He under went cardiac cath initially with Successful PCI of proximal through distal RCA and rPLA on 5/12. Then he underwent s/p 2nd staged cath/PCI  Successful complex bifurcation stenting of left circumflex/OM 2 ?Dual antiplatelet therapy for at least 12 months and preferably indefinitely ?Aggressive risk factor  modification and cardiac rehab ?Follow-up with cardiology ?  ?  ?History of tobacco use/emphysema/COPD ?No acute exacerbation ? ? ?Diabetes mellitus type 2 with CKD stage IIa ?A1c 5.9 ?Continue home meds ?  ?AKI ?Status post cath. ?Started on ivf for gentle hydration  ?Improved ? ? ?Essential hypertension ?Continue outpatient medications ?  ?Hyperlipidemia ?- Previously intolerant to statin per documentation ?On Zetia ?Given Repatha ? ?  ? ?Discharge Diagnoses:  ?Principal Problem: ?  NSTEMI (non-ST elevated myocardial infarction) ([place]) ?Active Problems: ?  Controlled diabetes mellitus with nephropathy ([place]) ?  Hypertension ?  Smoker ?  Coronary artery calcification of native artery ?  Emphysema lung ([place]) ?  Mixed hyperlipidemia ?  Chest pain ?  Hypokalemia ? ? ? ?Discharge Instructions ? ?Discharge Instructions   ? ? AMB Referral to Cardiac Rehabilitation - Phase II   Complete by: As directed ?  ? Diagnosis:  Coronary Stents ?NSTEMI  ?  ? After initial evaluation and assessments completed: Virtual Based Care may be provided alone or in conjunction with Phase 2 Cardiac Rehab based on patient barriers.: Yes  ? Diet - low sodium heart healthy   Complete by: As directed ?  ? Discharge instructions   Complete by: As directed ?  ? F/u with pcp and cardiology. ?Had plavix this morning, next dose tomorrow morning  ? Increase activity slowly   Complete by: As directed ?  ? ?  ? ?Allergies as of 02/12/2022   ? ?   Reactions  ? Simvastatin Other (See Comments)  ? Muscle aches  ? Statins Other (See Comments)  ? Joint pain   ? ?  ? ?  ?Medication  List  ?  ? ?STOP taking these medications   ? ?hydrochlorothiazide 25 MG tablet ?Commonly known as: HYDRODIURIL ?  ?sildenafil 100 MG tablet ?Commonly known as: VIAGRA ?  ?valsartan 160 MG tablet ?Commonly known as: DIOVAN ?  ? ?  ? ?TAKE these medications   ? ?amLODipine 10 MG tablet ?Commonly known as: NORVASC ?Take 1 tablet (10 mg total) by mouth daily. ?Start taking on: Feb 13, 2022 ?What changed:  ?medication strength ?how much to take ?  ?aspirin EC 81 MG tablet ?Take 1 tablet (81 mg total) by mouth daily. ?  ?carvedilol 6.25 MG tablet ?Commonly known as: COREG ?Take 1 tablet (6.25 mg total) by mouth 2 (two) times daily with a meal. ?  ?clopidogrel 75 MG tablet ?Commonly known as: PLAVIX ?Take 1 tablet (75 mg total) by mouth daily with breakfast. ?Start taking on: Feb 13, 2022 ?  ?DOCOSAHEXAENOIC ACID PO ?Take by mouth. ?  ?ezetimibe 10 MG tablet ?Commonly known as: ZETIA ?Take 1 tablet (10 mg total) by mouth daily. ?Start taking on: Feb 13, 2022 ?  ?losartan 25 MG tablet ?Commonly known as: COZAAR ?Take 1 tablet (25 mg total) by mouth daily. ?Start taking on: Feb 13, 2022 ?  ?metFORMIN 1000 MG tablet ?Commonly known as: GLUCOPHAGE ?Take 1 tablet (1,000 mg total) by mouth daily. Start tomorrow ?What changed: additional instructions ?  ?multivitamin with minerals tablet ?Take by mouth. ?  ?nitroGLYCERIN 0.4 MG SL tablet ?Commonly known as: NITROSTAT ?Place 1 tablet (0.4 mg total) under the tongue every 5 (five) minutes as needed for chest pain. ?  ?Repatha SureClick 202 MG/ML So[place] ?Generic drug: Evolocumab ?Inject 140 mg into the skin every 14 (fourteen) days. ?  ?solifenacin 5 MG tablet ?Commonly known as: VESICARE ?Take 5 mg by mouth daily. ?  ?solifenacin 10 MG tablet ?Commonly known as: VESICARE ?Take 10 mg by mouth daily. ?  ?tamsulosin 0.4 MG Caps capsule ?Commonly known as: FLOMAX ?Take 0.4 mg by mouth daily. ?  ?vitamin C 500 MG tablet ?Commonly known as: ASCORBIC ACID ?Take 500 mg by mouth. ?  ?Vitamin D3 50 MCG (2000 UT) capsule ?Take by mouth. ?  ? ?  ? ? Follow-up Information   ? ? Romualdo Bolk, FNP Follow up in 1 week(s).   ?Specialty: Nurse Practitioner ?Contact information: ?[address] [place] [address] ?[phone] ? ? ?  ?  ? ? Minna Merritts, MD Follow up in 2 week(s).   ?Specialty: Cardiology ?Contact information: ?[address] ?[place]  [place] [address] ?[phone] ? ? ?  ?  ? ?  ?  ? ?  ? ?Allergies  ?Allergen Reactions  ? Simvastatin Other (See Comments)  ?  Muscle aches  ? Statins Other (See Comments)  ?  Joint pain   ? ? ?Consultations: ?Cardiology ? ? ?Procedures/Studies: ?DG Chest 2 View ? ?Result Date: 02/08/2022 ?CLINICAL DATA:  Chest pain. EXAM: CHEST - 2 VIEW COMPARISON:  CT chest 06/11/2017. FINDINGS: No consolidation. No visible pleural effusions or pneumothorax. Cardiomediastinal silhouette is within normal limits. No displaced fracture. IMPRESSION: No evidence of acute cardiopulmonary disease. Electronically Signed   By: Margaretha Sheffield M.D.   On: 02/08/2022 09:29  ? ?CARDIAC CATHETERIZATION ? ?Result Date: 02/11/2022 ?  Mid LAD lesion is 25% stenosed.   Dist Cx-2 lesion is 60% stenosed.   Mid Cx lesion is 70% stenosed.   1st Diag lesion is 50% stenosed.   3rd Diag lesion  is 90% stenosed.   2nd Mrg lesion is 80% stenosed.   Dist Cx-1 lesion is 95% stenosed.   A drug-eluting stent was successfully placed using a STENT ONYX FRONTIER 3.0X38.   A drug-eluting stent was successfully placed using a STENT ONYX FRONTIER 3.5X18.   A drug-eluting stent was successfully placed using a STENT ONYX FRONTIER 3.0X22.   Post intervention, there is a 0% residual stenosis.   Post intervention, there is a 0% residual stenosis.   Post intervention, there is a 0% residual stenosis.   Post intervention, there is a 0% residual stenosis. Successful complex bifurcation stenting of left circumflex/OM 2. Recommendations: Dual antiplatelet therapy for at least 1 year and ideally indefinitely. Aggressive treatment of risk factors.  ? ?CARDIAC CATHETERIZATION ? ?Result Date: 02/08/2022 ?Conclusions: Severe two-vessel coronary artery disease with thrombotic occlusion of the proximal/mid RCA followed by sequential 70-80% mid and distal RCA stenoses, 90% proximal RPL a lesion, and subtotal occlusions of distal rPLA and rPL2, as well as sequential mid/distal LCx and OM 2  lesions of 60-80%. Large LAD with 20-30% proximal disease as well as 50% stenosis of distal portion of D1 and 90% stenosis in small D3. Low normal left ventricular systolic function with subtle basal and mid inferior

## 2022-02-12 NOTE — Progress Notes (Signed)
? ?Progress Note ? ?Patient Name: Daniel Fuentes ?Date of Encounter: 02/12/2022 ? ?[facility] Cardiologist: Ida Rogue, MD  ? ?Subjective  ? ?Patient was seen on AM rounds after having staged PCI completed on 02/11/2022. He denies any chest pain or shortness of breath. Does endorse some issues with word recall that he thinks started recently but is not sure. Blood pressure higher this morning. Increasing activity without difficulty. ? ?Inpatient Medications  ?  ?Scheduled Meds: ? amLODipine  10 mg Oral Daily  ? aspirin  81 mg Oral Daily  ? carvedilol  6.25 mg Oral BID WC  ? clopidogrel  75 mg Oral Q breakfast  ? ezetimibe  10 mg Oral Daily  ? losartan  25 mg Oral Daily  ? sodium chloride flush  3 mL Intravenous Q12H  ? sodium chloride flush  3 mL Intravenous Q12H  ? sodium chloride flush  3 mL Intravenous Q12H  ? sodium chloride flush  3 mL Intravenous Q12H  ? ?Continuous Infusions: ? sodium chloride    ? sodium chloride    ? lactated ringers 50 mL/hr at 02/11/22 2100  ? ?PRN Meds: ?sodium chloride, sodium chloride, acetaminophen, dextromethorphan-guaiFENesin, hydrALAZINE, ipratropium-albuterol, melatonin, metoprolol tartrate, nitroGLYCERIN, ondansetron (ZOFRAN) IV, oxyCODONE, senna-docusate, sodium chloride flush, sodium chloride flush, traZODone  ? ?Vital Signs  ?  ?Vitals:  ? 02/11/22 1700 02/11/22 1937 02/12/22 0025 02/12/22 0428  ?BP: (!) 134/91 (!) 155/85 127/72 (!) 154/82  ?Pulse:  77 77 64  ?Resp: '17 19 18 14  '$ ?Temp:  98 ?F (36.7 ?C) 98.5 ?F (36.9 ?C) 98.2 ?F (36.8 ?C)  ?TempSrc:    Oral  ?SpO2:  100% 100% 100%  ?Weight:      ?Height:      ? ? ?Intake/Output Summary (Last 24 hours) at 02/12/2022 0932 ?Last data filed at 02/11/2022 2203 ?Gross per 24 hour  ?Intake 746.68 ml  ?Output 1700 ml  ?Net -953.32 ml  ? ? ?  02/11/2022  ?  7:14 AM 02/08/2022  ?  7:00 PM 02/08/2022  ?  9:03 AM  ?Last 3 Weights  ?Weight (lbs) 178 lb 9.2 oz 178 lb 11.2 oz 197 lb  ?Weight (kg) 81 kg 81.058 kg 89.359 kg  ?   ? ?Telemetry   ?  ?SR rate 64 with ST depression and inverted T waves - Personally Reviewed ? ?ECG  ?  ?SR rate 67, LAD, RBBB, LVH, ST depression and T wave inversions- Personally Reviewed ? ?Physical Exam  ? ?GEN: No acute distress. Lying in bed watching TV ?Neck: No JVD ?Cardiac: RRR, no murmurs, rubs, or gallops.  ?Respiratory: Clear to auscultation bilaterally. ?GI: Soft, nontender, non-distended  ?MS: No edema; No deformity. Right radial access site for cath with small amount pf bruising without bleeding or hematoma, gauze and opsite dressing C/D/I, 2+ pulse ?Neuro:  Nonfocal  ?Psych: Normal affect  ? ?Labs  ?  ?High Sensitivity Troponin:   ?Recent Labs  ?Lab 02/08/22 ?0916 02/08/22 ?1844 02/09/22 ?6962  ?TROPONINIHS 966* [id number]* 10,077*  ?   ?Chemistry ?Recent Labs  ?Lab 02/08/22 ?0916 02/09/22 ?0203 02/10/22 ?9528 02/11/22 ?4132 02/12/22 ?0348  ?NA 138   < > 136 138 135  ?K 3.5   < > 4.0 3.8 3.7  ?CL 106   < > 102 106 106  ?CO2 23   < > '26 25 22  '$ ?GLUCOSE 178*   < > 180* 121* 113*  ?BUN 19   < > '13 16 16  '$ ?CREATININE 1.31*   < >  1.26* 0.97 1.07  ?CALCIUM 9.0   < > 8.7* 8.8* 8.7*  ?MG  --    < > 2.2 2.1 2.2  ?PROT 6.7  --   --   --   --   ?ALBUMIN 3.6  --   --   --   --   ?AST 43*  --   --   --   --   ?ALT 13  --   --   --   --   ?ALKPHOS 57  --   --   --   --   ?BILITOT 0.8  --   --   --   --   ?GFRNONAA 58*   < > >60 >60 >60  ?ANIONGAP 9   < > '8 7 7  '$ ? < > = values in this interval not displayed.  ?  ?Lipids  ?Recent Labs  ?Lab 02/08/22 ?1844  ?CHOL 181  ?TRIG 114  ?HDL 48  ?LDLCALC 110*  ?CHOLHDL 3.8  ?  ?Hematology ?Recent Labs  ?Lab 02/10/22 ?0762 02/11/22 ?2633 02/12/22 ?0348  ?WBC 5.7 5.2 6.4  ?RBC 4.38 4.29 4.25  ?HGB 12.7* 12.5* 12.4*  ?HCT 38.4* 38.0* 37.3*  ?MCV 87.7 88.6 87.8  ?MCH 29.0 29.1 29.2  ?MCHC 33.1 32.9 33.2  ?RDW 14.7 14.7 14.5  ?PLT 231 233 248  ? ?Thyroid No results for input(s): TSH, FREET4 in the last 168 hours.  ?BNP ?Recent Labs  ?Lab 02/08/22 ?0918  ?BNP 137.6*  ?  ?DDimer No results for  input(s): DDIMER in the last 168 hours.  ? ?Radiology  ?  ?Cardiac Studies  ?Left Heart Cath and Coronary Angiography completed on 02/08/2022 ?Severe two-vessel coronary artery disease with thrombotic occlusion of the proximal/mid RCA followed by sequential 70-80% mid and distal RCA stenoses, 90% proximal RPL a lesion, and subtotal occlusions of distal rPLA and rPL2, as well as sequential mid/distal LCx and OM 2 lesions of 60-80%. ?Large LAD with 20-30% proximal disease as well as 50% stenosis of distal portion of D1 and 90% stenosis in small D3. ?Low normal left ventricular systolic function with subtle basal and mid inferior hypokinesis (LVEF 50-55%). ?Normal left ventricular filling pressure (LVEDP 10 mmHg). ?Successful PCI of proximal through distal RCA and rPLA using overlapping Onyx Frontier 3.0 x 38 mm and 3.5 x 12 mm drug-eluting stents in the proximal/mid vessel as well as nonoverlapping Onyx Frontier 3.5 x 8 mm and 2.0 x 12 mm drug-eluting stents to treat the distal RCA and rPLA, respectively.  Excellent angiographic result with 0% residual stenosis was achieved. ?  ?Recommendations: ?Plan for staged PCI to LCx/OM2 on Monday with Dr. Fletcher Anon. ?Dual antiplatelet therapy with aspirin and clopidogrel for at least 12 months, ideally longer. ?Restart IV heparin 2 hours after TR band removal, to be continued until LCx revascularization has been performed. ?Aggressive secondary prevention.  Continue ezetimibe with plans to add PCSK9 inhibitor as an outpatient given history of statin intolerance. ?Follow-up echocardiogram. ? ?Echocardiogram completed on 02/09/2022 ? ?1. Left ventricular ejection fraction, by estimation, is 50 to 55%. The  ?left ventricle has low normal function. The left ventricle demonstrates  ?regional wall motion abnormalities (see scoring diagram/findings for  ?description). There is moderate left  ?ventricular hypertrophy. Left ventricular diastolic parameters are  ?consistent with Grade I  diastolic dysfunction (impaired relaxation). There  ?is mild hypokinesis of the left ventricular, basal-mid inferior wall and  ?inferoseptal wall.  ? 2. Right ventricular systolic function is mildly reduced. The  right  ?ventricular size is normal. There is normal pulmonary artery systolic  ?pressure.  ? 3. Right atrial size was mildly dilated.  ? 4. The mitral valve is normal in structure. Mild mitral valve  ?regurgitation. No evidence of mitral stenosis.  ? 5. Tricuspid valve regurgitation is mild to moderate.  ? 6. The aortic valve is tricuspid. There is mild calcification of the  ?aortic valve. There is moderate thickening of the aortic valve. Aortic  ?valve regurgitation is not visualized. Aortic valve  ?sclerosis/calcification is present, without any evidence of  ?aortic stenosis.  ? 7. The inferior vena cava is dilated in size with >50% respiratory  ?variability, suggesting right atrial pressure of 8 mmHg.  ? ?Coronary Stent Intervention completed on 02/11/2022 ? ?  Mid LAD lesion is 25% stenosed. ?  Dist Cx-2 lesion is 60% stenosed. ?  Mid Cx lesion is 70% stenosed. ?  1st Diag lesion is 50% stenosed. ?  3rd Diag lesion is 90% stenosed. ?  2nd Mrg lesion is 80% stenosed. ?  Dist Cx-1 lesion is 95% stenosed. ?  A drug-eluting stent was successfully placed using a STENT ONYX FRONTIER 3.0X38. ?  A drug-eluting stent was successfully placed using a STENT ONYX FRONTIER 3.5X18. ?  A drug-eluting stent was successfully placed using a STENT ONYX FRONTIER 3.0X22. ?  Post intervention, there is a 0% residual stenosis. ?  Post intervention, there is a 0% residual stenosis. ?  Post intervention, there is a 0% residual stenosis. ?  Post intervention, there is a 0% residual stenosis. ?  ?Successful complex bifurcation stenting of left circumflex/OM 2. ?  ?Recommendations: ?Dual antiplatelet therapy for at least 1 year and ideally indefinitely. ?Aggressive treatment of risk factors. ? ?Patient Profile  ?   ?71 y.o. male with  hx of hypertension, hyperlipidemia, DM II, CKD II, subdural hematoma s/p burr holes in the setting of a fall, AAA, and remote tobacco abuse, who presented 02/08/2022 with pre-syncope, diaphoresis, dyspnea, episodi

## 2022-02-12 NOTE — TOC Initial Note (Signed)
Transition of Care (TOC) - Initial/Assessment Note  ? ? ?Patient Details  ?Name: Daniel Fuentes ?MRN: [id number] ?Date of Birth: 09/27/1951 ? ?Transition of Care (TOC) CM/SW Contact:    ?Laurena Slimmer, RN ?Phone Number: ?02/12/2022, 10:29 AM ? ?Clinical Narrative:                 ? ?Transition of Care (TOC) Screening Note ? ? ?Patient Details  ?Name: Daniel Fuentes ?Date of Birth: [birth year] ? ? ?Transition of Care (TOC) CM/SW Contact:    ?Laurena Slimmer, RN ?Phone Number: ?02/12/2022, 10:29 AM ? ? ? ?Transition of Care Department [facility]) has reviewed patient and no TOC needs have been identified at this time. We will continue to monitor patient advancement through interdisciplinary progression rounds. If new patient transition needs arise, please place a TOC consult. ? ? ? ?  ?  ? ? ?Patient Goals and CMS Choice ?  ?  ?  ? ?Expected Discharge Plan and Services ?  ?  ?  ?  ?  ?                ?  ?  ?  ?  ?  ?  ?  ?  ?  ?  ? ?Prior Living Arrangements/Services ?  ?  ?  ?       ?  ?  ?  ?  ? ?Activities of Daily Living ?  ?  ? ?Permission Sought/Granted ?  ?  ?   ?   ?   ?   ? ?Emotional Assessment ?  ?  ?  ?  ?  ?  ? ?Admission diagnosis:  NSTEMI (non-ST elevated myocardial infarction) ([place]) [I21.4] ?Chest pain [R07.9] ?Patient Active Problem List  ? Diagnosis Date Noted  ? Hypokalemia 02/09/2022  ? Chest pain 02/08/2022  ? NSTEMI (non-ST elevated myocardial infarction) ([place]) 02/08/2022  ? Colon cancer screening   ? Porokeratosis 01/03/2020  ? Pericardial effusion 09/11/2017  ? LVH (left ventricular hypertrophy) 09/11/2017  ? Mixed hyperlipidemia 09/11/2017  ? Coronary artery calcification of native artery 06/13/2017  ? Emphysema lung ([place]) 06/13/2017  ? Personal history of tobacco use, presenting hazards to health 06/12/2017  ? Aneurysm, abdominal aortic ([place]) 06/10/2017  ? History of traumatic head injury 04/30/2016  ? Smoker 03/23/2016  ? Allergic rhinitis 03/21/2016  ? Controlled diabetes mellitus with nephropathy ([place])  08/21/2015  ? Hypertension 08/21/2015  ? ?PCP:  Romualdo Bolk, FNP ?Pharmacy:   ?[facility] [place] [facility] [address] ?[address] ?[place] [address] ?Phone: [phone] Fax: [phone] ? ?[facility] [address] [place], [place] - [address] ?[address] ?[place] [address] ?Phone: [phone] Fax: [phone] ? ?[facility] ?[address] ?[place] [address] ?Phone: [phone] Fax: [phone] ? ? ? ? ?Social Determinants of Health (SDOH) Interventions ?  ? ?Readmission Risk Interventions ?   ? View : No data to display.  ?  ?  ?  ? ? ? ?

## 2022-02-12 NOTE — Consult Note (Signed)
MEDICATION RELATED CONSULT NOTE - FOLLOW UP ? ? ?Pharmacy Consult for [place] (on discharge) ?Indication: HLD & Cad intolerant to statins ? ?Allergies  ?Allergen Reactions  ? Simvastatin Other (See Comments)  ?  Muscle aches  ? Statins Other (See Comments)  ?  Joint pain   ? ? ?Patient Measurements: ?Height: '5\' 9"'$  (175.3 cm) ?Weight: 81 kg (178 lb 9.2 oz) ?IBW/kg (Calculated) : 70.7 ? ?Labs: ?Recent Labs  ?  02/10/22 ?0388 02/11/22 ?8280 02/12/22 ?0348  ?WBC 5.7 5.2 6.4  ?HGB 12.7* 12.5* 12.4*  ?HCT 38.4* 38.0* 37.3*  ?PLT 231 233 248  ?CREATININE 1.26* 0.97 1.07  ?MG 2.2 2.1 2.2  ? ?Estimated Creatinine Clearance: 62.4 mL/min (by C-G formula based on SCr of 1.07 mg/dL).  ? ?Microbiology: ?Recent Results (from the past 720 hour(s))  ?Blood culture (routine x 2)     Status: None (Preliminary result)  ? Collection Time: 02/08/22  9:16 AM  ? Specimen: BLOOD  ?Result Value Ref Range Status  ? Specimen Description BLOOD BLOOD RIGHT FOREARM  Final  ? Special Requests   Final  ?  BOTTLES DRAWN AEROBIC AND ANAEROBIC Blood Culture adequate volume  ? Culture   Final  ?  NO GROWTH 3 DAYS ?Performed at [facility], [address] [place], [place] [address] ?  ? Report Status PENDING  Incomplete  ?Blood culture (routine x 2)     Status: None (Preliminary result)  ? Collection Time: 02/08/22  9:16 AM  ? Specimen: BLOOD  ?Result Value Ref Range Status  ? Specimen Description BLOOD BLOOD LEFT HAND  Final  ? Special Requests   Final  ?  BOTTLES DRAWN AEROBIC AND ANAEROBIC Blood Culture adequate volume  ? Culture   Final  ?  NO GROWTH 3 DAYS ?Performed at [facility], [address], [place], [place] [address] ?  ? Report Status PENDING  Incomplete  ? ?Current Medications: none ?Intolerances: simvastatin, ezetimibe ?LDL goal: '50mg'$ /dL ? ?Plan:  ?Start repatha per cardiology orders. Prior authorization completed. 30 day free card provided and meds to bed done prior to discharge ?Patient notified of $100 co-pay ,  will provide [employer] safetynet paperwork to complete and bring to cardiology follow up to assist with medication cost. ? ?Monnica Saltsman Rodriguez-Guzman PharmD, BCPS ?02/12/2022 7:58 AM ? ? ? ?

## 2022-02-13 LAB — CULTURE, BLOOD (ROUTINE X 2)
Culture: NO GROWTH
Culture: NO GROWTH
Special Requests: ADEQUATE
Special Requests: ADEQUATE

## 2022-02-18 ENCOUNTER — Encounter: Payer: Medicare Other | Attending: Cardiovascular Disease | Admitting: *Deleted

## 2022-02-18 ENCOUNTER — Encounter: Payer: Self-pay | Admitting: *Deleted

## 2022-02-18 DIAGNOSIS — I214 Non-ST elevation (NSTEMI) myocardial infarction: Secondary | ICD-10-CM

## 2022-02-18 DIAGNOSIS — Z955 Presence of coronary angioplasty implant and graft: Secondary | ICD-10-CM

## 2022-02-18 NOTE — Progress Notes (Signed)
Virtual orientation call completed today. he has an appointment on Date: 03/04/2022  for EP eval and gym Orientation.  Documentation of diagnosis can be found in  [facility]  Date: 02/08/2022 Shooter is a recent tobacco user. Intervention for tobacco cessation was provided at the initial medical review. He was asked about readiness to quit and reported he quit a few weeks ago . Staff will continue to provide cessation encouragement and follow up with the patient throughout the program.

## 2022-02-19 ENCOUNTER — Ambulatory Visit: Payer: Medicare Other | Admitting: Nurse Practitioner

## 2022-02-19 ENCOUNTER — Encounter: Payer: Self-pay | Admitting: Nurse Practitioner

## 2022-02-19 VITALS — BP 148/80 | HR 54 | Ht 69.0 in | Wt 176.2 lb

## 2022-02-19 DIAGNOSIS — I2510 Atherosclerotic heart disease of native coronary artery without angina pectoris: Secondary | ICD-10-CM | POA: Diagnosis not present

## 2022-02-19 DIAGNOSIS — I255 Ischemic cardiomyopathy: Secondary | ICD-10-CM

## 2022-02-19 DIAGNOSIS — I10 Essential (primary) hypertension: Secondary | ICD-10-CM | POA: Diagnosis not present

## 2022-02-19 DIAGNOSIS — I214 Non-ST elevation (NSTEMI) myocardial infarction: Secondary | ICD-10-CM | POA: Diagnosis not present

## 2022-02-19 DIAGNOSIS — E119 Type 2 diabetes mellitus without complications: Secondary | ICD-10-CM

## 2022-02-19 DIAGNOSIS — E785 Hyperlipidemia, unspecified: Secondary | ICD-10-CM | POA: Diagnosis not present

## 2022-02-19 MED ORDER — AMLODIPINE BESYLATE 10 MG PO TABS: 10.0000 mg | ORAL_TABLET | Freq: Every day | ORAL | 3 refills | Status: DC

## 2022-02-19 MED ORDER — REPATHA SURECLICK 140 MG/ML ~~LOC~~ SOAJ
140.0000 mg | SUBCUTANEOUS | 11 refills | Status: AC
Start: 2022-02-19 — End: ?

## 2022-02-19 MED ORDER — LOSARTAN POTASSIUM 25 MG PO TABS: 25.0000 mg | ORAL_TABLET | Freq: Every day | ORAL | 3 refills | Status: DC

## 2022-02-19 MED ORDER — CARVEDILOL 6.25 MG PO TABS
6.2500 mg | ORAL_TABLET | Freq: Two times a day (BID) | ORAL | 3 refills | Status: DC
Start: 2022-02-19 — End: 2023-04-07

## 2022-02-19 MED ORDER — CLOPIDOGREL BISULFATE 75 MG PO TABS: 75.0000 mg | ORAL_TABLET | Freq: Every day | ORAL | 3 refills | Status: AC

## 2022-02-19 NOTE — Progress Notes (Signed)
Office Visit    Patient Name: Daniel Fuentes Date of Encounter: 02/19/2022  Primary Care Provider:  Romualdo Bolk, FNP Primary Cardiologist:  Daniel Rogue, MD  Chief Complaint    71 year old male with history of hypertension, hyperlipidemia, type 2 diabetes mellitus, stage II chronic kidney disease, prior subdural hematoma in the setting of head trauma status post evacuation, abdominal aortic aneurysm, tobacco abuse, presents for follow-up after recent hospitalization for non-STEMI and multivessel PCI.  Past Medical History    Past Medical History:  Diagnosis Date   CAD (coronary artery disease)    a. 01/2022 NSTEMI/PCI: LM nl, LAD 39m D1 50, D3 90, LCX 755m95/60d, OM2 80, RCA 100p/m, 8049m0d (3.5x12, 3.0x38, & 3.5x8 Onyx Frontier DES), RPAV1 90 (2.0x12 Onyx Frontier DES), RPAV2 99, RPL1 50, RPL2 99. EF 50-55%; b. 01/2022 Staged PCI: LCX (3.5x18 & 3.0x38 Onyx Frontier DES), OM2 (3.0x22 Onyx Frontier DES).   CKD (chronic kidney disease), stage II    Diabetes mellitus without complication ([place]  Hyperlipidemia    Hypertension    Ischemic cardiomyopathy    a. 01/2022 Echo: EF 50-55%, mod LVH, GRI DD, mild basal-mid inf/infsept HK. Mildly reduced RV fxn, Mildly dil RA. Mild MR. MIld-mod TR. AoV sclerosis w/o stenosis.   Past Surgical History:  Procedure Laterality Date   BRAIN SURGERY  2017   Subdural Hematoma   COLONOSCOPY WITH PROPOFOL N/A 05/30/2021   Procedure: COLONOSCOPY WITH PROPOFOL;  Surgeon: Daniel LandsmanD;  [facility];  Service: Gastroenterology;  Laterality: N/A;   CORONARY STENT INTERVENTION N/A 02/08/2022   Procedure: CORONARY STENT INTERVENTION;  Surgeon: Daniel BushD;  [facility];  Service: Cardiovascular;  Laterality: N/A;   CORONARY STENT INTERVENTION N/A 02/11/2022   Procedure: CORONARY STENT INTERVENTION;  Surgeon: Daniel HampshireD;  [facility];  Service: Cardiovascular;  Laterality:  N/A;   LEFT HEART CATH AND CORONARY ANGIOGRAPHY N/A 02/08/2022   Procedure: LEFT HEART CATH AND CORONARY ANGIOGRAPHY;  Surgeon: Daniel BushD;  [facility];  Service: Cardiovascular;  Laterality: N/A;    Allergies  Allergies  Allergen Reactions   Simvastatin Other (See Comments)    Muscle aches   Statins Other (See Comments)    Joint pain     History of Present Illness    [age]ar old male with the above complex past medical history including hypertension, hyperlipidemia, diabetes, stage II chronic kidney disease, subdural hematoma in the setting of head trauma status post evacuation, AAA, and tobacco abuse.  Patient presented to [facility] on May 12 with complaints of presyncope, diaphoresis, dyspnea, and episodic chest pain.  He was noted to be relatively hypotensive.  Troponin returned at 966 and subsequently rose to 18,478.  Echocardiogram showed an EF of 50 to 55% with mild basal-mid inferior and inferoseptal hypokinesis.  Diagnostic catheterization was performed and showed an occluded right coronary artery with severe left circumflex and second obtuse marginal disease.  The RCA was felt to be the infarct vessel and this was successfully treated with 3 drug-eluting stents.  The RPA V was also treated with a drug-eluting stent.  He subsequently went staged PCI during the same hospitalization of the left circumflex/OM 2 with 2 stents placed in the left circumflex, and 1 in the second obtuse marginal.  He was placed on aspirin, beta-blocker, Plavix, ARB, and Repatha therapy (statin intolerant), and discharged home on May 16.  Since discharge, [name]  felt well.  He says that his wife has not been letting him do anything and he is bored.  He denies chest pain, dyspnea, palpitations, PND, orthopnea, dizziness, syncope, edema, or early satiety.  He has chronic left arm paresthesias, which are largely positional in nature.  He also had some right ankle  discomfort on the day of discharge which resolved by the next day.  His wife wondered if perhaps this could be secondary to [place], which he had taken earlier in the day.  He is also occasionally noted lightheadedness if he stands up quickly.  He has already done his phone and review with cardiac rehab and has orientation scheduled this week.  Home Medications    Current Outpatient Medications  Medication Sig Dispense Refill   aspirin EC 81 MG tablet Take 1 tablet (81 mg total) by mouth daily. 30 tablet 0   Cholecalciferol (VITAMIN D3) 2000 units capsule Take by mouth.     metFORMIN (GLUCOPHAGE) 1000 MG tablet Take 1 tablet (1,000 mg total) by mouth daily. Start tomorrow     Multiple Vitamins-Minerals (MULTIVITAMIN WITH MINERALS) tablet Take by mouth.     nitroGLYCERIN (NITROSTAT) 0.4 MG SL tablet Place 1 tablet (0.4 mg total) under the tongue every 5 (five) minutes as needed for chest pain. 30 tablet 0   solifenacin (VESICARE) 10 MG tablet Take 10 mg by mouth daily.     tamsulosin (FLOMAX) 0.4 MG CAPS capsule Take 0.4 mg by mouth daily.     vitamin C (ASCORBIC ACID) 500 MG tablet Take 500 mg by mouth.     amLODipine (NORVASC) 10 MG tablet Take 1 tablet (10 mg total) by mouth daily. 90 tablet 3   carvedilol (COREG) 6.25 MG tablet Take 1 tablet (6.25 mg total) by mouth 2 (two) times daily with a meal. 180 tablet 3   clopidogrel (PLAVIX) 75 MG tablet Take 1 tablet (75 mg total) by mouth daily with breakfast. 90 tablet 3   Evolocumab (REPATHA SURECLICK) 701 MG/ML SOAJ Inject 140 mg into the skin every 14 (fourteen) days. 2 mL 11   losartan (COZAAR) 25 MG tablet Take 1 tablet (25 mg total) by mouth daily. 90 tablet 3   No current facility-administered medications for this visit.     Review of Systems    Occasional orthostatic lightheadedness he denies chest pain, palpitations, dyspnea, pnd, orthopnea, n, v, syncope, edema, weight gain, or early satiety.  Some degree of chronic left arm  paresthesias.  Right ankle pain on the day of discharge that is since resolved.  All other systems reviewed and are otherwise negative except as noted above.    Physical Exam    VS:  BP (!) 148/80 (BP Location: Left Arm, Patient Position: Sitting, Cuff Size: Normal)   Pulse (!) 54   Ht '5\' 9"'$  (1.753 m)   Wt 176 lb 4 oz (79.9 kg)   SpO2 99%   BMI 26.03 kg/m  , BMI Body mass index is 26.03 kg/m.    Orthostatic VS for the past 24 hrs:  BP- Lying Pulse- Lying BP- Sitting Pulse- Sitting BP- Standing at 0 minutes Pulse- Standing at 0 minutes  02/19/22 1501 130/79 54 133/79 61 140/80 68     GEN: Well nourished, well developed, in no acute distress. HEENT: normal. Neck: Supple, no JVD, carotid bruits, or masses. Cardiac: RRR, no murmurs, rubs, or gallops. No clubbing, cyanosis, edema.  Radials/PT 2+ and equal bilaterally.  Right radial catheterization site without bleeding, bruit, or  hematoma. Respiratory:  Respirations regular and unlabored, clear to auscultation bilaterally. GI: Soft, nontender, nondistended, BS + x 4. MS: no deformity or atrophy. Skin: warm and dry, no rash. Neuro:  Strength and sensation are intact. Psych: Normal affect.  Accessory Clinical Findings    ECG personally reviewed by me today -sinus bradycardia, 54, left axis deviation, right bundle branch block, inferior T wave inversion- no acute changes.  Lab Results  Component Value Date   WBC 6.4 02/12/2022   HGB 12.4 (L) 02/12/2022   HCT 37.3 (L) 02/12/2022   MCV 87.8 02/12/2022   PLT 248 02/12/2022   Lab Results  Component Value Date   CREATININE 1.07 02/12/2022   BUN 16 02/12/2022   NA 135 02/12/2022   K 3.7 02/12/2022   CL 106 02/12/2022   CO2 22 02/12/2022   Lab Results  Component Value Date   ALT 13 02/08/2022   AST 43 (H) 02/08/2022   ALKPHOS 57 02/08/2022   BILITOT 0.8 02/08/2022   Lab Results  Component Value Date   CHOL 181 02/08/2022   HDL 48 02/08/2022   LDLCALC 110 (H) 02/08/2022    TRIG 114 02/08/2022   CHOLHDL 3.8 02/08/2022    Lab Results  Component Value Date   HGBA1C 5.9 (H) 02/08/2022    Assessment & Plan    1.  Non-STEMI, subsequent episode of care/coronary artery disease: Patient recently admitted to [facility] with vasovagal type symptoms of diaphoresis, lightheadedness, hypotension, and weakness.  He did have chest pain the prior evening.  Troponin rose to greater than 18,000.  Echo showed an EF of 50 to 55%.  Diagnostic catheterization revealed occluded RCA, which was successfully treated with 3 drug-eluting stents.  The RPA V1 was treated with a drug-eluting stent as well.  He subsequently underwent staged intervention during the same hospitalization for severe left circumflex and obtuse marginal disease with an additional total of 3 stents placed.  He has been compliant with aspirin, Plavix, beta-blocker, ARB, and Repatha therapy.  He was previously intolerant to statins.  Refilling medications today.  As he is on Repatha with a previous LDL of 110, will discontinue Zetia.  As noted above, he is interested in cardiac rehabilitation and plans to enroll.  2.  Essential hypertension: Blood pressure initially elevated at 148/80.  He has been having occasional orthostatic lightheadedness and repeat blood pressure was 130/79 while lying.  He is not orthostatic by vital signs.  We will continue his current regimen of carvedilol, losartan, and amlodipine.  3.  Hyperlipidemia: LDL 110.  He is intolerant to statins and has been prescribed Repatha and Zetia.  He would like to limit medications as much as possible and in light of ongoing Repatha, I will stop Zetia.  Plan to follow-up fasting lipids in about a month with a goal LDL of less than 55.  4.  Ischemic cardiomyopathy: EF 50 to 55% with mild basal-mid inferior and inferoseptal hypokinesis.  Also had mildly reduced RV function.  Euvolemic on examination.  Continue beta-blocker and ARB therapy.  Low threshold to  consider SGLT2 inhibitor however, this would likely be cost prohibitive for patient.  5.  Type 2 diabetes mellitus: A1c 5.9 earlier this month.  He is on metformin therapy.  As above, consider SGLT2 inhibitor in the setting of above, though likely because prohibitive.  6.  Disposition: Follow-up in 1 month.  We will plan to follow-up lipids at that time.  Patient to look into costs of Repatha going  forward.  If cost prohibitive, we need to switch back to Zetia.   Murray Hodgkins, NP 02/19/2022, 5:54 PM

## 2022-02-19 NOTE — Patient Instructions (Signed)
Medication Instructions:  No changes at this time.   *If you need a refill on your cardiac medications before your next appointment, please call your pharmacy*   Lab Work: None  If you have labs (blood work) drawn today and your tests are completely normal, you will receive your results only by: [place] (if you have MyChart) OR A paper copy in the mail If you have any lab test that is abnormal or we need to change your treatment, we will call you to review the results.   Testing/Procedures: None   Follow-Up: At [facility], you and your health needs are our priority.  As part of our continuing mission to provide you with exceptional heart care, we have created designated Provider Care Teams.  These Care Teams include your primary Cardiologist (physician) and Advanced Practice Providers (APPs -  Physician Assistants and Nurse Practitioners) who all work together to provide you with the care you need, when you need it.   Your next appointment:   1 month(s)  The format for your next appointment:   In Person  Provider:   Ida Rogue, MD or Murray Hodgkins, NP       Important Information About Sugar

## 2022-03-04 ENCOUNTER — Telehealth: Payer: Self-pay | Admitting: *Deleted

## 2022-03-04 ENCOUNTER — Encounter: Payer: Medicare Other | Attending: Cardiovascular Disease | Admitting: *Deleted

## 2022-03-04 VITALS — Ht 68.0 in | Wt 175.1 lb

## 2022-03-04 DIAGNOSIS — I214 Non-ST elevation (NSTEMI) myocardial infarction: Secondary | ICD-10-CM | POA: Diagnosis present

## 2022-03-04 DIAGNOSIS — I714 Abdominal aortic aneurysm, without rupture, unspecified: Secondary | ICD-10-CM

## 2022-03-04 DIAGNOSIS — Z955 Presence of coronary angioplasty implant and graft: Secondary | ICD-10-CM | POA: Diagnosis present

## 2022-03-04 NOTE — Progress Notes (Signed)
Cardiac Individual Treatment Plan  Patient Details  Name: Daniel Fuentes MRN: [id number] Date of Birth: 01-11-1951 Referring Provider:   Flowsheet Row Cardiac Rehab from 03/04/2022 in [facility] Cardiac and Pulmonary Rehab  Referring Provider Dr. Saunders Revel       Initial Encounter Date:  Flowsheet Row Cardiac Rehab from 03/04/2022 in [facility] Cardiac and Pulmonary Rehab  Date 03/04/22       Visit Diagnosis: NSTEMI (non-ST elevated myocardial infarction) [facility])  Status post coronary artery stent placement  Patient's Home Medications on Admission:  Current Outpatient Medications:    amLODipine (NORVASC) 10 MG tablet, Take 1 tablet (10 mg total) by mouth daily., Disp: 90 tablet, Rfl: 3   aspirin EC 81 MG tablet, Take 1 tablet (81 mg total) by mouth daily., Disp: 30 tablet, Rfl: 0   carvedilol (COREG) 6.25 MG tablet, Take 1 tablet (6.25 mg total) by mouth 2 (two) times daily with a meal., Disp: 180 tablet, Rfl: 3   Cholecalciferol (VITAMIN D3) 2000 units capsule, Take by mouth., Disp: , Rfl:    clopidogrel (PLAVIX) 75 MG tablet, Take 1 tablet (75 mg total) by mouth daily with breakfast., Disp: 90 tablet, Rfl: 3   Evolocumab (REPATHA SURECLICK) 675 MG/ML SOAJ, Inject 140 mg into the skin every 14 (fourteen) days., Disp: 2 mL, Rfl: 11   losartan (COZAAR) 25 MG tablet, Take 1 tablet (25 mg total) by mouth daily., Disp: 90 tablet, Rfl: 3   metFORMIN (GLUCOPHAGE) 1000 MG tablet, Take 1 tablet (1,000 mg total) by mouth daily. Start tomorrow, Disp: , Rfl:    Multiple Vitamins-Minerals (MULTIVITAMIN WITH MINERALS) tablet, Take by mouth., Disp: , Rfl:    nitroGLYCERIN (NITROSTAT) 0.4 MG SL tablet, Place 1 tablet (0.4 mg total) under the tongue every 5 (five) minutes as needed for chest pain., Disp: 30 tablet, Rfl: 0   solifenacin (VESICARE) 10 MG tablet, Take 10 mg by mouth daily., Disp: , Rfl:    tamsulosin (FLOMAX) 0.4 MG CAPS capsule, Take 0.4 mg by mouth daily., Disp: , Rfl:    vitamin C (ASCORBIC ACID) 500 MG  tablet, Take 500 mg by mouth., Disp: , Rfl:   Past Medical History: Past Medical History:  Diagnosis Date   CAD (coronary artery disease)    a. 01/2022 NSTEMI/PCI: LM nl, LAD 65m D1 50, D3 90, LCX 718m95/60d, OM2 80, RCA 100p/m, 8020m0d (3.5x12, 3.0x38, & 3.5x8 Onyx Frontier DES), RPAV1 90 (2.0x12 Onyx Frontier DES), RPAV2 99, RPL1 50, RPL2 99. EF 50-55%; b. 01/2022 Staged PCI: LCX (3.5x18 & 3.0x38 Onyx Frontier DES), OM2 (3.0x22 Onyx Frontier DES).   CKD (chronic kidney disease), stage II    Di[place] mellitus [place] complication [place]  Hyperlipidemia    Hypertension    Ischemic cardiomyopathy    a. 01/2022 Echo: EF 50-55%, mod LVH, GRI DD, mild basal-mid inf/infsept HK. Mildly reduced RV fxn, Mildly dil RA. Mild MR. MIld-mod TR. AoV sclerosis w/o stenosis.    Tobacco Use: Social History   Tobacco Use  Smoking Status Former   Packs/day: 0.20   Years: 50.00   Pack years: 10.00   Types: Pipe, Cigarettes   Start date: 09/04/1966   Quit date: 02/08/2022   Years since quitting: 0.0  Smokeless Tobacco Never    Labs: Review Flowsheet        Latest Ref Rng & Units 05/26/2017 05/27/2017 12/26/2017 05/14/2018  Labs for ITP Cardiac and Pulmonary Rehab  Cholestrol 0 - 200 mg/dL  187   195  LDL (calc) 0 - 99 mg/dL  125   130     HDL-C >40 mg/dL  40   37     Trlycerides <150 mg/dL  112   158     Hemoglobin A1c 4.8 - 5.6 % 6.3    8.4   7.1       02/08/2022  Labs for ITP Cardiac and Pulmonary Rehab  Cholestrol 181    LDL (calc) 110    HDL-C 48    Trlycerides 114    Hemoglobin A1c 5.9             Exercise Target Goals: Exercise Program Goal: Individual exercise prescription set using results from initial 6 min walk test and THRR while considering  patient's activity barriers and safety.   Exercise Prescription Goal: Initial exercise prescription builds to 30-45 minutes a day of aerobic activity, 2-3 days per week.  Home exercise guidelines will be given to patient during  program as part of exercise prescription that the participant will acknowledge.   Education: Aerobic Exercise: - Group verbal and visual presentation on the components of exercise prescription. Introduces F.I.T.T principle from ACSM for exercise prescriptions.  Reviews F.I.T.T. principles of aerobic exercise including progression. Written material given at graduation.   Education: Resistance Exercise: - Group verbal and visual presentation on the components of exercise prescription. Introduces F.I.T.T principle from ACSM for exercise prescriptions  Reviews F.I.T.T. principles of resistance exercise including progression. Written material given at graduation.    Education: Exercise & Equipment Safety: - Individual verbal instruction and demonstration of equipment use and safety with use of the equipment. Flowsheet Row Cardiac Rehab from 03/04/2022 in [facility] Cardiac and Pulmonary Rehab  Date 03/04/22  Educator [facility]  Instruction Review Code 1- Verbalizes Understanding       Education: Exercise Physiology & General Exercise Guidelines: - Group verbal and written instruction with models to review the exercise physiology of the cardiovascular system and associated critical values. Provides general exercise guidelines with specific guidelines to those with heart or lung disease.  Flowsheet Row Cardiac Rehab from 03/04/2022 in [facility] Cardiac and Pulmonary Rehab  Education need identified 03/04/22       Education: Flexibility, Balance, Mind/Body Relaxation: - Group verbal and visual presentation with interactive activity on the components of exercise prescription. Introduces F.I.T.T principle from ACSM for exercise prescriptions. Reviews F.I.T.T. principles of flexibility and balance exercise training including progression. Also discusses the mind body connection.  Reviews various relaxation techniques to help reduce and manage stress (i.e. Deep breathing, progressive muscle relaxation, and  visualization). Balance handout provided to take home. Written material given at graduation.   Activity Barriers & Risk Stratification:  Activity Barriers & Cardiac Risk Stratification - 02/18/22 1451       Activity Barriers & Cardiac Risk Stratification   Activity Barriers None    Cardiac Risk Stratification Moderate             6 Minute Walk:  6 Minute Walk     Row Name 03/04/22 1721         6 Minute Walk   Phase Initial     Distance 1215 feet     Walk Time 6 minutes     # of Rest Breaks 0     MPH 2.3     METS 3.45     RPE 11     Perceived Dyspnea  0     VO2 Peak 12.06     Symptoms Yes (comment)  Comments High BP response to 6 MWT (220/100). Patient forgot to take meds today.     Resting HR 63 bpm     Resting BP 150/80     Resting Oxygen Saturation  99 %     Exercise Oxygen Saturation  during 6 min walk 100 %     Max Ex. HR 108 bpm     Max Ex. BP 220/100     2 Minute Post BP 188/80  After further rest patient BP came down to 160/80              Oxygen Initial Assessment:   Oxygen Re-Evaluation:   Oxygen Discharge (Final Oxygen Re-Evaluation):   Initial Exercise Prescription:  Initial Exercise Prescription - 03/04/22 1700       Date of Initial Exercise RX and Referring Provider   Date 03/04/22    Referring Provider Dr. Saunders Revel      Oxygen   Maintain Oxygen Saturation 88% or higher      Treadmill   MPH 108    Grade 0    Minutes 15    METs 2.38      Recumbant Bike   Level 1    RPM 60    Minutes 15    METs 3.45      NuStep   Level 2    SPM 80    Minutes 15    METs 3.45      T5 Nustep   Level 1    SPM 80    Minutes 15    METs 3.45      Track   Laps 25    Minutes 15    METs 2.36      Prescription Details   Frequency (times per week) 2    Duration Progress to 30 minutes of continuous aerobic without signs/symptoms of physical distress      Intensity   THRR 40-80% of Max Heartrate 96-130    Ratings of Perceived Exertion  11-13    Perceived Dyspnea 0-4      Progression   Progression Continue to progress workloads to maintain intensity without signs/symptoms of physical distress.      Resistance Training   Training Prescription No   Patient omitted weights becasue of BP response to walking. Patient forgot take BP meds today. Re-evaluate on first day when patient has taken meds to monotor BP reponse to exercise.            Perform Capillary Blood Glucose checks as needed.  Exercise Prescription Changes:   Exercise Prescription Changes     Row Name 03/04/22 1700             Response to Exercise   Heart Rate (Admit) 63 bpm       Heart Rate (Exercise) 108 bpm       Heart Rate (Exit) 73 bpm       Oxygen Saturation (Admit) 99 %       Oxygen Saturation (Exercise) 100 %       Oxygen Saturation (Exit) 100 %       Rating of Perceived Exertion (Exercise) 11       Perceived Dyspnea (Exercise) 0       Symptoms High BP resonse to exercise       Comments closely monitor BP repsonse to exercise when patient has taken meds                Exercise Comments:   Exercise Goals and Review:  Exercise Goals     Row Name 03/04/22 1729             Exercise Goals   Increase Physical Activity Yes       Intervention Provide advice, education, support and counseling about physical activity/exercise needs.;Develop an individualized exercise prescription for aerobic and resistive training based on initial evaluation findings, risk stratification, comorbidities and participant's personal goals.       Expected Outcomes Short Term: Attend rehab on a regular basis to increase amount of physical activity.;Long Term: Add in home exercise to make exercise part of routine and to increase amount of physical activity.;Long Term: Exercising regularly at least 3-5 days a week.       Increase Strength and Stamina Yes       Intervention Provide advice, education, support and counseling about physical  activity/exercise needs.;Develop an individualized exercise prescription for aerobic and resistive training based on initial evaluation findings, risk stratification, comorbidities and participant's personal goals.       Expected Outcomes Short Term: Increase workloads from initial exercise prescription for resistance, speed, and METs.;Short Term: Perform resistance training exercises routinely during rehab and add in resistance training at home;Long Term: Improve cardiorespiratory fitness, muscular endurance and strength as measured by increased METs and functional capacity (6MWT)       Able to understand and use rate of perceived exertion (RPE) scale Yes       Intervention Provide education and explanation on how to use RPE scale       Expected Outcomes Short Term: Able to use RPE daily in rehab to express subjective intensity level;Long Term:  Able to use RPE to guide intensity level when exercising independently       Able to understand and use Dyspnea scale Yes       Intervention Provide education and explanation on how to use Dyspnea scale       Expected Outcomes Short Term: Able to use Dyspnea scale daily in rehab to express subjective sense of shortness of breath during exertion;Long Term: Able to use Dyspnea scale to guide intensity level when exercising independently       Knowledge and understanding of Target Heart Rate Range (THRR) Yes       Intervention Provide education and explanation of THRR including how the numbers were predicted and where they are located for reference       Expected Outcomes Short Term: Able to state/look up THRR;Long Term: Able to use THRR to govern intensity when exercising independently;Short Term: Able to use daily as guideline for intensity in rehab       Able to check pulse independently Yes       Intervention Provide education and demonstration on how to check pulse in carotid and radial arteries.;Review the importance of being able to check your own pulse for  safety during independent exercise       Expected Outcomes Short Term: Able to explain why pulse checking is important during independent exercise;Long Term: Able to check pulse independently and accurately       Understanding of Exercise Prescription Yes       Intervention Provide education, explanation, and written materials on patient's individual exercise prescription       Expected Outcomes Short Term: Able to explain program exercise prescription;Long Term: Able to explain home exercise prescription to exercise independently                Exercise Goals Re-Evaluation :   Discharge Exercise Prescription (Final Exercise  Prescription Changes):  Exercise Prescription Changes - 03/04/22 1700       Response to Exercise   Heart Rate (Admit) 63 bpm    Heart Rate (Exercise) 108 bpm    Heart Rate (Exit) 73 bpm    Oxygen Saturation (Admit) 99 %    Oxygen Saturation (Exercise) 100 %    Oxygen Saturation (Exit) 100 %    Rating of Perceived Exertion (Exercise) 11    Perceived Dyspnea (Exercise) 0    Symptoms High BP resonse to exercise    Comments closely monitor BP repsonse to exercise when patient has taken meds             Nutrition:  Target Goals: Understanding of nutrition guidelines, daily intake of sodium '1500mg'$ , cholesterol '200mg'$ , calories 30% from fat and 7% or less from saturated fats, daily to have 5 or more servings of fruits and vegetables.  Education: All About Nutrition: -Group instruction provided by verbal, written material, interactive activities, discussions, models, and posters to present general guidelines for heart healthy nutrition including fat, fiber, MyPlate, the role of sodium in heart healthy nutrition, utilization of the nutrition label, and utilization of this knowledge for meal planning. Follow up email sent as well. Written material given at graduation. Flowsheet Row Cardiac Rehab from 03/04/2022 in [facility] Cardiac and Pulmonary Rehab  Education need  identified 03/04/22       Biometrics:  Pre Biometrics - 03/04/22 1729       Pre Biometrics   Height '5\' 8"'$  (1.727 m)    Weight 175 lb 1.6 oz (79.4 kg)    BMI (Calculated) 26.63    Single Leg Stand 24 seconds              Nutrition Therapy Plan and Nutrition Goals:  Nutrition Therapy & Goals - 03/04/22 1744       Intervention Plan   Intervention Prescribe, educate and counsel regarding individualized specific dietary modifications aiming towards targeted core components such as weight, hypertension, lipid management, diabetes, heart failure and other comorbidities.    Expected Outcomes Short Term Goal: Understand basic principles of dietary content, such as calories, fat, sodium, cholesterol and nutrients.;Short Term Goal: A plan has been developed with personal nutrition goals set during dietitian appointment.;Long Term Goal: Adherence to prescribed nutrition plan.             Nutrition Assessments:  MEDIFICTS Score Key: ?70 Need to make dietary changes  40-70 Heart Healthy Diet ? 40 Therapeutic Level Cholesterol Diet  Flowsheet Row Cardiac Rehab from 03/04/2022 in [facility] Cardiac and Pulmonary Rehab  Picture Your Plate Total Score on Admission 58      Picture Your Plate Scores: <88 Unhealthy dietary pattern with much room for improvement. 41-50 Dietary pattern unlikely to meet recommendations for good health and room for improvement. 51-60 More healthful dietary pattern, with some room for improvement.  >60 Healthy dietary pattern, although there may be some specific behaviors that could be improved.    Nutrition Goals Re-Evaluation:   Nutrition Goals Discharge (Final Nutrition Goals Re-Evaluation):   Psychosocial: Target Goals: Acknowledge presence or absence of significant depression and/or stress, maximize coping skills, provide positive support system. Participant is able to verbalize types and ability to use techniques and skills needed for reducing  stress and depression.   Education: Stress, Anxiety, and Depression - Group verbal and visual presentation to define topics covered.  Reviews how body is impacted by stress, anxiety, and depression.  Also discusses healthy ways to reduce  stress and to treat/manage anxiety and depression.  Written material given at graduation.   Education: Sleep Hygiene -Provides group verbal and written instruction about how sleep can affect your health.  Define sleep hygiene, discuss sleep cycles and impact of sleep habits. Review good sleep hygiene tips.    Initial Review & Psychosocial Screening:  Initial Psych Review & Screening - 02/18/22 1452       Initial Review   Current issues with None Identified      Family Dynamics   Good Support System? Yes   wife, 20 years,     Barriers   Psychosocial barriers to participate in program There are no identifiable barriers or psychosocial needs.      Screening Interventions   Interventions Encouraged to exercise;To provide support and resources with identified psychosocial needs;Provide feedback about the scores to participant    Expected Outcomes Short Term goal: Utilizing psychosocial counselor, staff and physician to assist with identification of specific Stressors or current issues interfering with healing process. Setting desired goal for each stressor or current issue identified.;Long Term Goal: Stressors or current issues are controlled or eliminated.;Short Term goal: Identification and review with participant of any Quality of Life or Depression concerns found by scoring the questionnaire.;Long Term goal: The participant improves quality of Life and PHQ9 Scores as seen by post scores and/or verbalization of changes             Quality of Life Scores:   Quality of Life - 03/04/22 1737       Quality of Life   Select Quality of Life      Quality of Life Scores   Health/Function Pre 19.07 %    Socioeconomic Pre 21.88 %    Psych/Spiritual Pre  26.86 %    Family Pre 26.86 %    GLOBAL Post 22.06 %            Scores of 19 and below usually indicate a poorer quality of life in these areas.  A difference of  2-3 points is a clinically meaningful difference.  A difference of 2-3 points in the total score of the Quality of Life Index has been associated with significant improvement in overall quality of life, self-image, physical symptoms, and general health in studies assessing change in quality of life.  PHQ-9: Review Flowsheet        03/04/2022 05/14/2018 01/06/2017 09/04/2016 08/21/2015  Depression screen PHQ 2/9  Decreased Interest 0 0 0 0 0  Down, Depressed, Hopeless 0 0 0 0 0  PHQ - 2 Score 0 0 0 0 0  Altered sleeping 1 0     Tired, decreased energy 1 0     Change in appetite 0 0     Feeling bad or failure about yourself  0 0     Trouble concentrating 1 0     Moving slowly or fidgety/restless 0 0     Suicidal thoughts 0 0     PHQ-9 Score 3 0     Difficult doing work/chores Somewhat difficult Not difficult at all            Interpretation of Total Score  Total Score Depression Severity:  1-4 = Minimal depression, 5-9 = Mild depression, 10-14 = Moderate depression, 15-19 = Moderately severe depression, 20-27 = Severe depression   Psychosocial Evaluation and Intervention:  Psychosocial Evaluation - 02/18/22 1501       Psychosocial Evaluation & Interventions   Interventions Encouraged to exercise with the program  and follow exercise prescription    Comments Daniel has no barriers to attending the program. He lives with his wife and she is his support. He wants to start driving amd I did advise him to check with his physician.  Daniel has  recently quit tobaccouse. He wants to saty quit.   He is ready to get started with the program.    Expected Outcomes STTG Daniel attends all scheduled session, he continue to remain without tobacco use. LTG Daniel continues with his exercise progression and remain tobacco free.    Continue  Psychosocial Services  Follow up required by staff             Psychosocial Re-Evaluation:   Psychosocial Discharge (Final Psychosocial Re-Evaluation):   Vocational Rehabilitation: Provide vocational rehab assistance to qualifying candidates.   Vocational Rehab Evaluation & Intervention:   Education: Education Goals: Education classes will be provided on a variety of topics geared toward better understanding of heart health and risk factor modification. Participant will state understanding/return demonstration of topics presented as noted by education test scores.  Learning Barriers/Preferences:   General Cardiac Education Topics:  AED/CPR: - Group verbal and written instruction with the use of models to demonstrate the basic use of the AED with the basic ABC's of resuscitation.   Anatomy and Cardiac Procedures: - Group verbal and visual presentation and models provide information about basic cardiac anatomy and function. Reviews the testing methods done to diagnose heart disease and the outcomes of the test results. Describes the treatment choices: Medical Management, Angioplasty, or Coronary Bypass Surgery for treating various heart conditions including Myocardial Infarction, Angina, Valve Disease, and Cardiac Arrhythmias.  Written material given at graduation. Flowsheet Row Cardiac Rehab from 03/04/2022 in [facility] Cardiac and Pulmonary Rehab  Education need identified 03/04/22       Medication Safety: - Group verbal and visual instruction to review commonly prescribed medications for heart and lung disease. Reviews the medication, class of the drug, and side effects. Includes the steps to properly store meds and maintain the prescription regimen.  Written material given at graduation.   Intimacy: - Group verbal instruction through game format to discuss how heart and lung disease can affect sexual intimacy. Written material given at graduation..   Know Your Numbers and  Heart Failure: - Group verbal and visual instruction to discuss disease risk factors for cardiac and pulmonary disease and treatment options.  Reviews associated critical values for Overweight/Obesity, Hypertension, Cholesterol, and Diabetes.  Discusses basics of heart failure: signs/symptoms and treatments.  Introduces Heart Failure Zone chart for action plan for heart failure.  Written material given at graduation.   Infection Prevention: - Provides verbal and written material to individual with discussion of infection control including proper hand washing and proper equipment cleaning during exercise session. Flowsheet Row Cardiac Rehab from 03/04/2022 in [facility] Cardiac and Pulmonary Rehab  Date 03/04/22  Educator [facility]  Instruction Review Code 1- Verbalizes Understanding       Falls Prevention: - Provides verbal and written material to individual with discussion of falls prevention and safety. Flowsheet Row Cardiac Rehab from 03/04/2022 in [facility] Cardiac and Pulmonary Rehab  Date 03/04/22  Educator [facility]  Instruction Review Code 1- Verbalizes Understanding       Other: -Provides group and verbal instruction on various topics (see comments)   Knowledge Questionnaire Score:  Knowledge Questionnaire Score - 03/04/22 1737       Knowledge Questionnaire Score   Pre Score 20/26  Core Components/Risk Factors/Patient Goals at Admission:  Personal Goals and Risk Factors at Admission - 03/04/22 1733       Core Components/Risk Factors/Patient Goals on Admission    Weight Management Yes;Weight Gain    Intervention Weight Management: Develop a combined nutrition and exercise program designed to reach desired caloric intake, while maintaining appropriate intake of nutrient and fiber, sodium and fats, and appropriate energy expenditure required for the weight goal.;Weight Management: Provide education and appropriate resources to help participant work on and attain dietary goals.     Admit Weight 175 lb 1.6 oz (79.4 kg)    Goal Weight: Short Term 180 lb (81.6 kg)    Goal Weight: Long Term 180 lb (81.6 kg)    Expected Outcomes Weight Maintenance: Understanding of the daily nutrition guidelines, which includes 25-35% calories from fat, 7% or less cal from saturated fats, less than '200mg'$  cholesterol, less than 1.5gm of sodium, & 5 or more servings of fruits and vegetables daily;Short Term: Continue to assess and modify interventions until short term weight is achieved;Long Term: Adherence to nutrition and physical activity/exercise program aimed toward attainment of established weight goal;Understanding recommendations for meals to include 15-35% energy as protein, 25-35% energy from fat, 35-60% energy from carbohydrates, less than '200mg'$  of dietary cholesterol, 20-35 gm of total fiber daily;Weight Gain: Understanding of general recommendations for a high calorie, high protein meal plan that promotes weight gain by distributing calorie intake throughout the day with the consumption for 4-5 meals, snacks, and/or supplements;Understanding of distribution of calorie intake throughout the day with the consumption of 4-5 meals/snacks    Tobacco Cessation Yes    Number of packs per day Daniel Fuentes is a recent tobacco user. Intervention for tobacco cessation was provided at the initial medical review. He was asked about readiness to quit and reported he quit a few weeks ago . Staff will continue to provide cessation encouragement and follow up with the patient throughout the program.    Intervention Assist the participant in steps to quit. Provide individualized education and counseling about committing to Tobacco Cessation, relapse prevention, and pharmacological support that can be provided by physician.;Advice worker, assist with locating and accessing local/national Quit Smoking programs, and support quit date choice.    Expected Outcomes Short Term: Will demonstrate readiness to  quit, by selecting a quit date.;Short Term: Will quit all tobacco product use, adhering to prevention of relapse plan.;Long Term: Complete abstinence from all tobacco products for at least 12 months from quit date.    Diabetes Yes    Intervention Provide education about signs/symptoms and action to take for hypo/hyperglycemia.;Provide education about proper nutrition, including hydration, and aerobic/resistive exercise prescription along with prescribed medications to achieve blood glucose in normal ranges: Fasting glucose 65-99 mg/dL    Expected Outcomes Short Term: Participant verbalizes understanding of the signs/symptoms and immediate care of hyper/hypoglycemia, proper foot care and importance of medication, aerobic/resistive exercise and nutrition plan for blood glucose control.;Long Term: Attainment of HbA1C < 7%.    Hypertension Yes    Intervention Provide education on lifestyle modifcations including regular physical activity/exercise, weight management, moderate sodium restriction and increased consumption of fresh fruit, vegetables, and low fat dairy, alcohol moderation, and smoking cessation.;Monitor prescription use compliance.    Expected Outcomes Short Term: Continued assessment and intervention until BP is < 140/67m HG in hypertensive participants. < 130/810mHG in hypertensive participants with diabetes, heart failure or chronic kidney disease.;Long Term: Maintenance of blood pressure at goal levels.  Lipids Yes    Intervention Provide education and support for participant on nutrition & aerobic/resistive exercise along with prescribed medications to achieve LDL '70mg'$ , HDL >'40mg'$ .    Expected Outcomes Short Term: Participant states understanding of desired cholesterol values and is compliant with medications prescribed. Participant is following exercise prescription and nutrition guidelines.;Long Term: Cholesterol controlled with medications as prescribed, with individualized exercise RX  and with personalized nutrition plan. Value goals: LDL < '70mg'$ , HDL > 40 mg.             Education:Diabetes - Individual verbal and written instruction to review signs/symptoms of diabetes, desired ranges of glucose level fasting, after meals and with exercise. Acknowledge that pre and post exercise glucose checks will be done for 3 sessions at entry of program. Schlusser from 03/04/2022 in [facility]  Date 03/04/22  Educator [facility]  Instruction Review Code 1- Verbalizes Understanding       Core Components/Risk Factors/Patient Goals Review:   Goals and Risk Factor Review     Row Name 03/04/22 1730             Core Components/Risk Factors/Patient Goals Review   Personal Goals Review Tobacco Cessation       Review Daniel Fuentes has recently quit tobacco use within the last 6 months. Intervention for relapse prevention was provided at the initial medical review. He was encouraged to continue to with tobacco cessation and was provided information on relapse prevention. Patient received information about combination therapy, tobacco cessation classes, quit line, and quit smoking apps in case of a relapse. Patient demonstrated understanding of this material.Staff will continue to provide encouragement and follow up with the patient throughout the program.       Expected Outcomes Short: continue no to smoke Long: maintain tobacco free lifestyle.                Core Components/Risk Factors/Patient Goals at Discharge (Final Review):   Goals and Risk Factor Review - 03/04/22 1730       Core Components/Risk Factors/Patient Goals Review   Personal Goals Review Tobacco Cessation    Review Daniel Fuentes has recently quit tobacco use within the last 6 months. Intervention for relapse prevention was provided at the initial medical review. He was encouraged to continue to with tobacco cessation and was provided information on relapse prevention. Patient  received information about combination therapy, tobacco cessation classes, quit line, and quit smoking apps in case of a relapse. Patient demonstrated understanding of this material.Staff will continue to provide encouragement and follow up with the patient throughout the program.    Expected Outcomes Short: continue no to smoke Long: maintain tobacco free lifestyle.             ITP Comments:  ITP Comments     Row Name 02/18/22 1507 03/04/22 1732         ITP Comments Virtual orientation call completed today. he has an appointment on Date: 03/04/2022  for EP eval and gym Orientation.  Documentation of diagnosis can be found in  [facility]  Date: 02/08/2022  Daniel Fuentes is a recent tobacco user. Intervention for tobacco cessation was provided at the initial medical review. He was asked about readiness to quit and reported he quit a few weeks ago . Staff will continue to provide cessation encouragement and follow up with the patient throughout the program. Completed 6MWT and gym orientation. Initial ITP created and sent for review to Dr. Emily Filbert, Medical  Director.Daniel Fuentes has recently quit tobacco use within the last 6 months. Intervention for relapse prevention was provided at the initial medical review. He was encouraged to continue to with tobacco cessation and was provided information on relapse prevention. Patient received information about combination therapy, tobacco cessation classes, quit line, and quit smoking apps in case of a relapse. Patient demonstrated understanding of this material.Staff will continue to provide encouragement and follow up with the patient throughout the program.  Note was sent to Dr. Saunders Revel reguarding BP parameters during exercise for patient.               Comments: initial ITP.

## 2022-03-04 NOTE — Patient Instructions (Signed)
Patient Instructions  Patient Details  Name: Daniel Fuentes MRN: [id number] Date of Birth: 1951-01-05 Referring Provider:  Nelva Bush, MD  Below are your personal goals for exercise, nutrition, and risk factors. Our goal is to help you stay on track towards obtaining and maintaining these goals. We will be discussing your progress on these goals with you throughout the program.  Initial Exercise Prescription:  Initial Exercise Prescription - 03/04/22 1700       Date of Initial Exercise RX and Referring Provider   Date 03/04/22    Referring Provider Dr. Saunders Revel      Oxygen   Maintain Oxygen Saturation 88% or higher      Treadmill   MPH 108    Grade 0    Minutes 15    METs 2.38      Recumbant Bike   Level 1    RPM 60    Minutes 15    METs 3.45      NuStep   Level 2    SPM 80    Minutes 15    METs 3.45      T5 Nustep   Level 1    SPM 80    Minutes 15    METs 3.45      Track   Laps 25    Minutes 15    METs 2.36      Prescription Details   Frequency (times per week) 2    Duration Progress to 30 minutes of continuous aerobic without signs/symptoms of physical distress      Intensity   THRR 40-80% of Max Heartrate 96-130    Ratings of Perceived Exertion 11-13    Perceived Dyspnea 0-4      Progression   Progression Continue to progress workloads to maintain intensity without signs/symptoms of physical distress.      Resistance Training   Training Prescription No   Patient omitted weights becasue of BP response to walking. Patient forgot take BP meds today. Re-evaluate on first day when patient has taken meds to monotor BP reponse to exercise.            Exercise Goals: Frequency: Be able to perform aerobic exercise two to three times per week in program working toward 2-5 days per week of home exercise.  Intensity: Work with a perceived exertion of 11 (fairly light) - 15 (hard) while following your exercise prescription.  We will make changes to your  prescription with you as you progress through the program.   Duration: Be able to do 30 to 45 minutes of continuous aerobic exercise in addition to a 5 minute warm-up and a 5 minute cool-down routine.   Nutrition Goals: Your personal nutrition goals will be established when you do your nutrition analysis with the dietician.  The following are general nutrition guidelines to follow: Cholesterol < '200mg'$ /day Sodium < '1500mg'$ /day Fiber: Men over 50 yrs - 30 grams per day  Personal Goals:  Personal Goals and Risk Factors at Admission - 03/04/22 1733       Core Components/Risk Factors/Patient Goals on Admission    Weight Management Yes;Weight Gain    Intervention Weight Management: Develop a combined nutrition and exercise program designed to reach desired caloric intake, while maintaining appropriate intake of nutrient and fiber, sodium and fats, and appropriate energy expenditure required for the weight goal.;Weight Management: Provide education and appropriate resources to help participant work on and attain dietary goals.    Admit Weight 175 lb 1.6 oz (79.4  kg)    Goal Weight: Short Term 180 lb (81.6 kg)    Goal Weight: Long Term 180 lb (81.6 kg)    Expected Outcomes Weight Maintenance: Understanding of the daily nutrition guidelines, which includes 25-35% calories from fat, 7% or less cal from saturated fats, less than '200mg'$  cholesterol, less than 1.5gm of sodium, & 5 or more servings of fruits and vegetables daily;Short Term: Continue to assess and modify interventions until short term weight is achieved;Long Term: Adherence to nutrition and physical activity/exercise program aimed toward attainment of established weight goal;Understanding recommendations for meals to include 15-35% energy as protein, 25-35% energy from fat, 35-60% energy from carbohydrates, less than '200mg'$  of dietary cholesterol, 20-35 gm of total fiber daily;Weight Gain: Understanding of general recommendations for a high  calorie, high protein meal plan that promotes weight gain by distributing calorie intake throughout the day with the consumption for 4-5 meals, snacks, and/or supplements;Understanding of distribution of calorie intake throughout the day with the consumption of 4-5 meals/snacks    Tobacco Cessation Yes    Number of packs per day Ami is a recent tobacco user. Intervention for tobacco cessation was provided at the initial medical review. He was asked about readiness to quit and reported he quit a few weeks ago . Staff will continue to provide cessation encouragement and follow up with the patient throughout the program.    Intervention Assist the participant in steps to quit. Provide individualized education and counseling about committing to Tobacco Cessation, relapse prevention, and pharmacological support that can be provided by physician.;Advice worker, assist with locating and accessing local/national Quit Smoking programs, and support quit date choice.    Expected Outcomes Short Term: Will demonstrate readiness to quit, by selecting a quit date.;Short Term: Will quit all tobacco product use, adhering to prevention of relapse plan.;Long Term: Complete abstinence from all tobacco products for at least 12 months from quit date.    Diabetes Yes    Intervention Provide education about signs/symptoms and action to take for hypo/hyperglycemia.;Provide education about proper nutrition, including hydration, and aerobic/resistive exercise prescription along with prescribed medications to achieve blood glucose in normal ranges: Fasting glucose 65-99 mg/dL    Expected Outcomes Short Term: Participant verbalizes understanding of the signs/symptoms and immediate care of hyper/hypoglycemia, proper foot care and importance of medication, aerobic/resistive exercise and nutrition plan for blood glucose control.;Long Term: Attainment of HbA1C < 7%.    Hypertension Yes    Intervention Provide education  on lifestyle modifcations including regular physical activity/exercise, weight management, moderate sodium restriction and increased consumption of fresh fruit, vegetables, and low fat dairy, alcohol moderation, and smoking cessation.;Monitor prescription use compliance.    Expected Outcomes Short Term: Continued assessment and intervention until BP is < 140/54m HG in hypertensive participants. < 130/8110mHG in hypertensive participants with diabetes, heart failure or chronic kidney disease.;Long Term: Maintenance of blood pressure at goal levels.    Lipids Yes    Intervention Provide education and support for participant on nutrition & aerobic/resistive exercise along with prescribed medications to achieve LDL '70mg'$ , HDL >'40mg'$ .    Expected Outcomes Short Term: Participant states understanding of desired cholesterol values and is compliant with medications prescribed. Participant is following exercise prescription and nutrition guidelines.;Long Term: Cholesterol controlled with medications as prescribed, with individualized exercise RX and with personalized nutrition plan. Value goals: LDL < '70mg'$ , HDL > 40 mg.             Tobacco Use Initial Evaluation: Social History  Tobacco Use  Smoking Status Former   Packs/day: 0.20   Years: 50.00   Pack years: 10.00   Types: Pipe, Cigarettes   Start date: 09/04/1966   Quit date: 02/08/2022   Years since quitting: 0.0  Smokeless Tobacco Never    Exercise Goals and Review:  Exercise Goals     Row Name 03/04/22 1729             Exercise Goals   Increase Physical Activity Yes       Intervention Provide advice, education, support and counseling about physical activity/exercise needs.;Develop an individualized exercise prescription for aerobic and resistive training based on initial evaluation findings, risk stratification, comorbidities and participant's personal goals.       Expected Outcomes Short Term: Attend rehab on a regular basis to  increase amount of physical activity.;Long Term: Add in home exercise to make exercise part of routine and to increase amount of physical activity.;Long Term: Exercising regularly at least 3-5 days a week.       Increase Strength and Stamina Yes       Intervention Provide advice, education, support and counseling about physical activity/exercise needs.;Develop an individualized exercise prescription for aerobic and resistive training based on initial evaluation findings, risk stratification, comorbidities and participant's personal goals.       Expected Outcomes Short Term: Increase workloads from initial exercise prescription for resistance, speed, and METs.;Short Term: Perform resistance training exercises routinely during rehab and add in resistance training at home;Long Term: Improve cardiorespiratory fitness, muscular endurance and strength as measured by increased METs and functional capacity (6MWT)       Able to understand and use rate of perceived exertion (RPE) scale Yes       Intervention Provide education and explanation on how to use RPE scale       Expected Outcomes Short Term: Able to use RPE daily in rehab to express subjective intensity level;Long Term:  Able to use RPE to guide intensity level when exercising independently       Able to understand and use Dyspnea scale Yes       Intervention Provide education and explanation on how to use Dyspnea scale       Expected Outcomes Short Term: Able to use Dyspnea scale daily in rehab to express subjective sense of shortness of breath during exertion;Long Term: Able to use Dyspnea scale to guide intensity level when exercising independently       Knowledge and understanding of Target Heart Rate Range (THRR) Yes       Intervention Provide education and explanation of THRR including how the numbers were predicted and where they are located for reference       Expected Outcomes Short Term: Able to state/look up THRR;Long Term: Able to use THRR to  govern intensity when exercising independently;Short Term: Able to use daily as guideline for intensity in rehab       Able to check pulse independently Yes       Intervention Provide education and demonstration on how to check pulse in carotid and radial arteries.;Review the importance of being able to check your own pulse for safety during independent exercise       Expected Outcomes Short Term: Able to explain why pulse checking is important during independent exercise;Long Term: Able to check pulse independently and accurately       Understanding of Exercise Prescription Yes       Intervention Provide education, explanation, and written materials on patient's individual exercise prescription  Expected Outcomes Short Term: Able to explain program exercise prescription;Long Term: Able to explain home exercise prescription to exercise independently                Copy of goals given to participant.

## 2022-03-04 NOTE — Telephone Encounter (Signed)
-----   Message from Nelva Bush, MD sent at 03/04/2022  2:57 PM EDT ----- Regarding: RE: AAA parameters for cardiac rehab Good afternoon,  It appears that Mr. Alejandro needs specific exercise parameters at cardiac rehab due to borderline AAA noted on duplex in 2018.  I don't see that it has been reevaluated since then.  Could you arrange for AAA duplex at Mr. Gable's earliest convenience so that I can answer the questions posed by cardiac rehab?  Thanks.  Gerald Stabs ----- Message ----- From: Dorita Sciara Sent: 03/04/2022   2:40 PM EDT To: Nelva Bush, MD Subject: AAA parameters for cardiac rehab               Dr. Saunders Revel,   Please complete AAA parameters for patient   EXERCISE PARAMETERS FOR PATIENTS WITH ABDOMINAL AORTIC ANEURYSM  BLOOD PRESSURE PARAMETERS: SYSTOLIC  DYSTOLIC EQUIPMENT:  please circle  Treadmill  Y N  If yes, indicate max.  Incline/grade _______  [employer] Bike Y N  Arm Crank  Y N  Recumbent Bike Y N  Track   Y [employer] (hand) Y N         If yes, please circle the maximum pound allowed       '1    2     3     4     5     6     7     8    10    '$ Please sign and return to [phone].  Thank you Cardiac Rehab Staff.  Signature/Date/Time

## 2022-03-05 ENCOUNTER — Telehealth: Payer: Self-pay | Admitting: *Deleted

## 2022-03-05 NOTE — Telephone Encounter (Signed)
Message was left with patient to inform him  that he needs to be re-evaluated for BP parameters before starting program due to his AAA diagnosis.

## 2022-03-05 NOTE — Telephone Encounter (Signed)
Attempted to schedule.  No ans no vm   AAA order in wq .  Closing Encounter.

## 2022-03-13 ENCOUNTER — Other Ambulatory Visit
Admission: RE | Admit: 2022-03-13 | Discharge: 2022-03-13 | Disposition: A | Discharge status: Home / Self Care | Payer: Medicare Other | Source: Ambulatory Visit | Attending: Nurse Practitioner | Admitting: Nurse Practitioner

## 2022-03-13 ENCOUNTER — Encounter: Payer: Self-pay | Admitting: Nurse Practitioner

## 2022-03-13 ENCOUNTER — Encounter: Payer: Medicare Other | Admitting: *Deleted

## 2022-03-13 ENCOUNTER — Ambulatory Visit: Payer: Medicare Other | Admitting: Nurse Practitioner

## 2022-03-13 ENCOUNTER — Encounter: Payer: Self-pay | Admitting: *Deleted

## 2022-03-13 ENCOUNTER — Other Ambulatory Visit: Payer: Self-pay | Admitting: *Deleted

## 2022-03-13 VITALS — BP 166/96 | HR 65 | Ht 69.0 in | Wt 174.2 lb

## 2022-03-13 DIAGNOSIS — Z955 Presence of coronary angioplasty implant and graft: Secondary | ICD-10-CM

## 2022-03-13 DIAGNOSIS — I255 Ischemic cardiomyopathy: Secondary | ICD-10-CM | POA: Diagnosis not present

## 2022-03-13 DIAGNOSIS — I214 Non-ST elevation (NSTEMI) myocardial infarction: Secondary | ICD-10-CM

## 2022-03-13 DIAGNOSIS — E785 Hyperlipidemia, unspecified: Secondary | ICD-10-CM

## 2022-03-13 DIAGNOSIS — I10 Essential (primary) hypertension: Secondary | ICD-10-CM | POA: Diagnosis not present

## 2022-03-13 DIAGNOSIS — I2510 Atherosclerotic heart disease of native coronary artery without angina pectoris: Secondary | ICD-10-CM

## 2022-03-13 LAB — HEPATIC FUNCTION PANEL
ALT: 11 U/L (ref 0–44)
AST: 29 U/L (ref 15–41)
Albumin: 3.9 g/dL (ref 3.5–5.0)
Alkaline Phosphatase: 56 U/L (ref 38–126)
Bilirubin, Direct: 0.1 mg/dL (ref 0.0–0.2)
Indirect Bilirubin: 0.8 mg/dL (ref 0.3–0.9)
Total Bilirubin: 0.9 mg/dL (ref 0.3–1.2)
Total Protein: 7.1 g/dL (ref 6.5–8.1)

## 2022-03-13 LAB — LIPID PANEL
Cholesterol: 142 mg/dL (ref 0–200)
HDL: 49 mg/dL (ref >40)
LDL Cholesterol: 82 mg/dL (ref 0–99)
Total CHOL/HDL Ratio: 2.9 RATIO
Triglycerides: 53 mg/dL (ref <150)
VLDL: 11 mg/dL (ref 0–40)

## 2022-03-13 LAB — GLUCOSE, CAPILLARY
Glucose-Capillary: 86 mg/dL (ref 70–99)
Glucose-Capillary: 93 mg/dL (ref 70–99)

## 2022-03-13 NOTE — Progress Notes (Signed)
Office Visit    Patient Name: Daniel Fuentes Date of Encounter: 03/13/2022  Primary Care Provider:  Romualdo Bolk, FNP Primary Cardiologist:  Ida Rogue, MD  Chief Complaint    71 year old male with history of hypertension, hyperlipidemia, type 2 diabetes mellitus, stage II chronic kidney disease, prior subdural hematoma in the setting of head trauma status post evacuation, abdominal aortic aneurysm, and tobacco abuse, who presents for follow-up after non-STEMI and multivessel PCI in May.  Past Medical History    Past Medical History:  Diagnosis Date   CAD (coronary artery disease)    a. 01/2022 NSTEMI/PCI: LM nl, LAD 22m D1 50, D3 90, LCX 752m95/60d, OM2 80, RCA 100p/m, 8032m0d (3.5x12, 3.0x38, & 3.5x8 Onyx Frontier DES), RPAV1 90 (2.0x12 Onyx Frontier DES), RPAV2 99, RPL1 50, RPL2 99. EF 50-55%; b. 01/2022 Staged PCI: LCX (3.5x18 & 3.0x38 Onyx Frontier DES), OM2 (3.0x22 Onyx Frontier DES).   CKD (chronic kidney disease), stage II    Di[place] ([place]  Hyperlipidemia    Hypertension    Ischemic cardiomyopathy    a. 01/2022 Echo: EF 50-55%, mod LVH, GRI DD, mild basal-mid inf/infsept HK. Mildly reduced RV fxn, Mildly dil RA. Mild MR. MIld-mod TR. AoV sclerosis w/o stenosis.   Past Surgical History:  Procedure Laterality Date   BRAIN SURGERY  2017   Subdural Hematoma   COLONOSCOPY WITH PROPOFOL N/A 05/30/2021   Procedure: COLONOSCOPY WITH PROPOFOL;  Surgeon: VanLin LandsmanD;  [facility];  Service: Gastroenterology;  Laterality: N/A;   CORONARY STENT INTERVENTION N/A 02/08/2022   Procedure: CORONARY STENT INTERVENTION;  Surgeon: EndNelva BushD;  [facility];  Service: Cardiovascular;  Laterality: N/A;   CORONARY STENT INTERVENTION N/A 02/11/2022   Procedure: CORONARY STENT INTERVENTION;  Surgeon: AriWellington HampshireD;  [facility];  Service: Cardiovascular;  Laterality: N/A;   LEFT  HEART CATH AND CORONARY ANGIOGRAPHY N/A 02/08/2022   Procedure: LEFT HEART CATH AND CORONARY ANGIOGRAPHY;  Surgeon: EndNelva BushD;  [facility];  Service: Cardiovascular;  Laterality: N/A;    Allergies  Allergies  Allergen Reactions   Simvastatin Other (See Comments)    Muscle aches   Statins Other (See Comments)    Joint pain     History of Present Illness    [age]ar old male with the above complex past medical history including hypertension, hyperlipidemia, diabetes, stage II chronic kidney disease, subdural hematoma in the setting of head trauma status post evacuation, abdominal aortic aneurysm, tobacco abuse, and coronary artery disease.  [facility] with complaints of presyncope, diaphoresis, dyspnea, and chest pain.  He was relatively hypotensive.  Troponin was 966 and subsequently elevated to 18,478.  Echo showed an EF of 50 to 55% with mild basal-mid inferior and inferoseptal hypokinesis.  Catheterization showed an occluded right coronary artery with severe left circumflex and obtuse marginal disease.  The RCA was felt to be the infarct vessel and this was successfully treated with 3 drug-eluting stents.  The RPAV was also treated with a drug-eluting stent.  He underwent staged PCI during the same hospitalization of the left circumflex/OM 2, with 2 stents placed in the left circumflex and 1 in the obtuse marginal.  He was discharged home on aspirin, beta-blocker, Plavix, ARB, and Repatha therapy (statin intolerant).  Mr. TorLindforss last seen in cardiology clinic on May 23, at which time he reported  occasional orthostatic lightheadedness.  He was not orthostatic on examination.  Since his last visit, Mr. Tiegs has done well.  He has resumed usual activities without symptoms or limitations.  He denies chest pain, palpitations, dyspnea, pnd, orthopnea, n, v, dizziness, syncope, edema, weight gain, or early satiety.  He did attend  a cardiac rehab session but was noted to be hypertensive after exercise and future sessions are on hold until after today's visit.  He is scheduled to be seen at rehab tonight.  He does check his blood pressure periodically at home and his reading last night was 128/78.  In clinic today, he is 160/90 and 166/96 on repeat.  He has yet to take his morning medications.  Home Medications    Current Outpatient Medications  Medication Sig Dispense Refill   amLODipine (NORVASC) 10 MG tablet Take 1 tablet (10 mg total) by mouth daily. 90 tablet 3   aspirin EC 81 MG tablet Take 1 tablet (81 mg total) by mouth daily. 30 tablet 0   carvedilol (COREG) 6.25 MG tablet Take 1 tablet (6.25 mg total) by mouth 2 (two) times daily with a meal. 180 tablet 3   Cholecalciferol (VITAMIN D3) 2000 units capsule Take 2,000 Units by mouth daily.     clopidogrel (PLAVIX) 75 MG tablet Take 1 tablet (75 mg total) by mouth daily with breakfast. 90 tablet 3   Evolocumab (REPATHA SURECLICK) 098 MG/ML SOAJ Inject 140 mg into the skin every 14 (fourteen) days. 2 mL 11   losartan (COZAAR) 25 MG tablet Take 1 tablet (25 mg total) by mouth daily. 90 tablet 3   metFORMIN (GLUCOPHAGE) 1000 MG tablet Take 1 tablet (1,000 mg total) by mouth daily. Start tomorrow     Multiple Vitamins-Minerals (MULTIVITAMIN WITH MINERALS) tablet Take 1 tablet by mouth daily.     nitroGLYCERIN (NITROSTAT) 0.4 MG SL tablet Place 1 tablet (0.4 mg total) under the tongue every 5 (five) minutes as needed for chest pain. 30 tablet 0   solifenacin (VESICARE) 10 MG tablet Take 10 mg by mouth daily.     tamsulosin (FLOMAX) 0.4 MG CAPS capsule Take 0.4 mg by mouth daily.     vitamin C (ASCORBIC ACID) 500 MG tablet Take 500 mg by mouth.     No current facility-administered medications for this visit.     Review of Systems    He denies chest pain, palpitations, dyspnea, pnd, orthopnea, n, v, dizziness, syncope, edema, weight gain, or early satiety.  All other  systems reviewed and are otherwise negative except as noted above.    Physical Exam    VS:  BP (!) 160/90 (BP Location: Left Arm, Patient Position: Sitting, Cuff Size: Normal)   Pulse 65   Ht '5\' 9"'$  (1.753 m)   Wt 174 lb 3.2 oz (79 kg)   SpO2 98%   BMI 25.72 kg/m  , BMI Body mass index is 25.72 kg/m.     Vitals:   03/13/22 0844 03/13/22 1231  BP: (!) 160/90 (!) 166/96  Pulse: 65   SpO2: 98%     GEN: Well nourished, well developed, in no acute distress. HEENT: normal. Neck: Supple, no JVD, carotid bruits, or masses. Cardiac: RRR, no murmurs, rubs, or gallops. No clubbing, cyanosis, edema.  Radials/PT 2+ and equal bilaterally.  Respiratory:  Respirations regular and unlabored, clear to auscultation bilaterally. GI: Soft, nontender, nondistended, BS + x 4. MS: no deformity or atrophy. Skin: warm and dry, no rash. Neuro:  Strength and sensation  are intact. Psych: Normal affect.  Accessory Clinical Findings    Lab Results  Component Value Date   WBC 6.4 02/12/2022   HGB 12.4 (L) 02/12/2022   HCT 37.3 (L) 02/12/2022   MCV 87.8 02/12/2022   PLT 248 02/12/2022   Lab Results  Component Value Date   CREATININE 1.07 02/12/2022   BUN 16 02/12/2022   NA 135 02/12/2022   K 3.7 02/12/2022   CL 106 02/12/2022   CO2 22 02/12/2022   Lab Results  Component Value Date   ALT 11 03/13/2022   AST 29 03/13/2022   ALKPHOS 56 03/13/2022   BILITOT 0.9 03/13/2022   Lab Results  Component Value Date   CHOL 142 03/13/2022   HDL 49 03/13/2022   LDLCALC 82 03/13/2022   TRIG 53 03/13/2022   CHOLHDL 2.9 03/13/2022    Lab Results  Component Value Date   HGBA1C 5.9 (H) 02/08/2022    Assessment & Plan    1.  Coronary artery disease: Admitted to [facility] last month with vasovagal type symptoms of diaphoresis, lightheadedness, hypotension, and weakness, and ruled in for non-STEMI with a troponin greater than 18,000.  Echo showed an EF of 50 to 55%.  Diagnostic  catheterization revealed an occluded RCA, which was successfully treated with 3 drug-eluting stents.  The RPAV was also treated with drug-eluting stent.  He subsequently underwent staged intervention during the same hospitalization for severe left circumflex and obtuse marginal disease, with an additional total of 3 stents placed.  He has been doing well over the past month without chest pain or dyspnea and remains on aspirin, Plavix, beta-blocker, ARB, and Repatha therapy.  He will attend cardiac rehabilitation tonight.  I note that his blood pressure is elevated in clinic today however, he has yet to take his morning medications.  I encouraged him to go home and take his medications and also trend his blood pressures over the next week, as we may need to make additional adjustments to his medicines.  As long as the pressure is stable later today, I do think he is able to participate in cardiac rehab.  2.  Essential hypertension: See #1.  Pressure elevated today in clinic though has been more stable at home-128/78 last night.  He has yet to take his morning medicines.  He will follow trends at home and submit for our review in a week or so.  May need additional titration of losartan.  3.  Hyperlipidemia: Obtain fasting labs today.  LDL 82 after recently starting Repatha.  Recommend regular exercise, lifestyle and dietary modifications, and weight loss.  Plan to reevaluate lipids in about 6 months and consider readdition of Zetia.  Patient reports statin intolerance.  4.  Ischemic cardiomyopathy: EF 50 to 55% with mild basal-mid inferior and inferoseptal hypokinesis.  Also has mildly reduced RV function.  He is euvolemic on examination.  Continue beta-blocker and ARB therapy.  SGLT2 inhibitor determined to be cost prohibitive.  5.  Type 2 diabetes mellitus: A1c 5.9 in May.  He is on metformin therapy.  As above, will consider SGLT2 inhibitor therapy however likely cost prohibitive.  6.  Disposition:  Follow-up in clinic in 2 months or sooner if necessary.   Murray Hodgkins, NP 03/13/2022, 12:29 PM

## 2022-03-13 NOTE — Progress Notes (Signed)
Cardiac Individual Treatment Plan  Patient Details  Name: Daniel Fuentes MRN: [id number] Date of Birth: [birth year] Referring Provider:   Flowsheet Row Cardiac Rehab from 03/04/2022 in [facility] Cardiac and Pulmonary Rehab  Referring Provider Dr. Saunders Revel       Initial Encounter Date:  Flowsheet Row Cardiac Rehab from 03/04/2022 in [facility] Cardiac and Pulmonary Rehab  Date 03/04/22       Visit Diagnosis: NSTEMI (non-ST elevated myocardial infarction) Daniel Fuentes)  Status post coronary artery stent placement  Patient's Home Medications on Admission:  Current Outpatient Medications:    amLODipine (NORVASC) 10 MG tablet, Take 1 tablet (10 mg total) by mouth daily., Disp: 90 tablet, Rfl: 3   aspirin EC 81 MG tablet, Take 1 tablet (81 mg total) by mouth daily., Disp: 30 tablet, Rfl: 0   carvedilol (COREG) 6.25 MG tablet, Take 1 tablet (6.25 mg total) by mouth 2 (two) times daily with a meal., Disp: 180 tablet, Rfl: 3   Cholecalciferol (VITAMIN D3) 2000 units capsule, Take 2,000 Units by mouth daily., Disp: , Rfl:    clopidogrel (PLAVIX) 75 MG tablet, Take 1 tablet (75 mg total) by mouth daily with breakfast., Disp: 90 tablet, Rfl: 3   Evolocumab (REPATHA SURECLICK) 814 MG/ML SOAJ, Inject 140 mg into the skin every 14 (fourteen) days., Disp: 2 mL, Rfl: 11   losartan (COZAAR) 25 MG tablet, Take 1 tablet (25 mg total) by mouth daily., Disp: 90 tablet, Rfl: 3   metFORMIN (GLUCOPHAGE) 1000 MG tablet, Take 1 tablet (1,000 mg total) by mouth daily. Start tomorrow, Disp: , Rfl:    Multiple Vitamins-Minerals (MULTIVITAMIN WITH MINERALS) tablet, Take 1 tablet by mouth daily., Disp: , Rfl:    nitroGLYCERIN (NITROSTAT) 0.4 MG SL tablet, Place 1 tablet (0.4 mg total) under the tongue every 5 (five) minutes as needed for chest pain., Disp: 30 tablet, Rfl: 0   solifenacin (VESICARE) 10 MG tablet, Take 10 mg by mouth daily., Disp: , Rfl:    tamsulosin (FLOMAX) 0.4 MG CAPS capsule, Take 0.4 mg by mouth daily., Disp: , Rfl:     vitamin C (ASCORBIC ACID) 500 MG tablet, Take 500 mg by mouth., Disp: , Rfl:   Past Medical History: Past Medical History:  Diagnosis Date   CAD (coronary artery disease)    a. 01/2022 NSTEMI/PCI: LM nl, LAD 63m D1 50, D3 90, LCX 71m95/60d, OM2 80, RCA 100p/m, 8063m0d (3.5x12, 3.0x38, & 3.5x8 Onyx Frontier DES), RPAV1 90 (2.0x12 Onyx Frontier DES), RPAV2 99, RPL1 50, RPL2 99. EF 50-55%; b. 01/2022 Staged PCI: LCX (3.5x18 & 3.0x38 Onyx Frontier DES), OM2 (3.0x22 Onyx Frontier DES).   CKD (chronic kidney disease), stage II    Diabetes mellitus without complication ([facility]rett  Hyperlipidemia    Hypertension    Ischemic cardiomyopathy    a. 01/2022 Echo: EF 50-55%, mod LVH, GRI DD, mild basal-mid inf/infsept HK. Mildly reduced RV fxn, Mildly dil RA. Mild MR. MIld-mod TR. AoV sclerosis w/o stenosis.    Tobacco Use: Social History   Tobacco Use  Smoking Status Former   Packs/day: 0.20   Years: 50.00   Total pack years: 10.00   Types: Pipe, Cigarettes   Start date: 09/04/1966   Quit date: 02/08/2022   Years since quitting: 0.0  Smokeless Tobacco Never    Labs: Review Flowsheet  More data exists      Latest Ref Rng & Units 05/27/2017 12/26/2017 05/14/2018 02/08/2022  Labs for ITP Cardiac and Pulmonary Rehab  Cholestrol 0 -  200 mg/dL 187  195  - 181   LDL (calc) 0 - 99 mg/dL 125  130  - 110   HDL-C >40 mg/dL 40  37  - 48   Trlycerides <150 mg/dL 112  158  - 114   Hemoglobin A1c 4.8 - 5.6 % - 8.4  7.1  5.9       03/13/2022  Labs for ITP Cardiac and Pulmonary Rehab  Cholestrol 142   LDL (calc) 82   HDL-C 49   Trlycerides 53   Hemoglobin A1c -     Exercise Target Goals: Exercise Program Goal: Individual exercise prescription set using results from initial 6 min walk test and THRR while considering  patient's activity barriers and safety.   Exercise Prescription Goal: Initial exercise prescription builds to 30-45 minutes a day of aerobic activity, 2-3 days per week.  Home exercise  guidelines will be given to patient during program as part of exercise prescription that the participant will acknowledge.   Education: Aerobic Exercise: - Group verbal and visual presentation on the components of exercise prescription. Introduces F.I.T.T principle from ACSM for exercise prescriptions.  Reviews F.I.T.T. principles of aerobic exercise including progression. Written material given at graduation.   Education: Resistance Exercise: - Group verbal and visual presentation on the components of exercise prescription. Introduces F.I.T.T principle from ACSM for exercise prescriptions  Reviews F.I.T.T. principles of resistance exercise including progression. Written material given at graduation.    Education: Exercise & Equipment Safety: - Individual verbal instruction and demonstration of equipment use and safety with use of the equipment. Flowsheet Row Cardiac Rehab from 03/04/2022 in [facility] Cardiac and Pulmonary Rehab  Date 03/04/22  Educator [facility]  Instruction Review Code 1- Verbalizes Understanding       Education: Exercise Physiology & General Exercise Guidelines: - Group verbal and written instruction with models to review the exercise physiology of the cardiovascular system and associated critical values. Provides general exercise guidelines with specific guidelines to those with heart or lung disease.  Flowsheet Row Cardiac Rehab from 03/04/2022 in [facility] Cardiac and Pulmonary Rehab  Education need identified 03/04/22       Education: Flexibility, Balance, Mind/Body Relaxation: - Group verbal and visual presentation with interactive activity on the components of exercise prescription. Introduces F.I.T.T principle from ACSM for exercise prescriptions. Reviews F.I.T.T. principles of flexibility and balance exercise training including progression. Also discusses the mind body connection.  Reviews various relaxation techniques to help reduce and manage stress (i.e. Deep breathing,  progressive muscle relaxation, and visualization). Balance handout provided to take home. Written material given at graduation.   Activity Barriers & Risk Stratification:  Activity Barriers & Cardiac Risk Stratification - 02/18/22 1451       Activity Barriers & Cardiac Risk Stratification   Activity Barriers None    Cardiac Risk Stratification Moderate             6 Minute Walk:  6 Minute Walk     Row Name 03/04/22 1721         6 Minute Walk   Phase Initial     Distance 1215 feet     Walk Time 6 minutes     # of Rest Breaks 0     MPH 2.3     METS 3.45     RPE 11     Perceived Dyspnea  0     VO2 Peak 12.06     Symptoms Yes (comment)     Comments High BP response to  6 MWT (220/100). Patient forgot to take meds today.     Resting HR 63 bpm     Resting BP 150/80     Resting Oxygen Saturation  99 %     Exercise Oxygen Saturation  during 6 min walk 100 %     Max Ex. HR 108 bpm     Max Ex. BP 220/100     2 Minute Post BP 188/80  After further rest patient BP came down to 160/80              Oxygen Initial Assessment:   Oxygen Re-Evaluation:   Oxygen Discharge (Final Oxygen Re-Evaluation):   Initial Exercise Prescription:  Initial Exercise Prescription - 03/04/22 1700       Date of Initial Exercise RX and Referring Provider   Date 03/04/22    Referring Provider Dr. Saunders Revel      Oxygen   Maintain Oxygen Saturation 88% or higher      Treadmill   MPH 108    Grade 0    Minutes 15    METs 2.38      Recumbant Bike   Level 1    RPM 60    Minutes 15    METs 3.45      NuStep   Level 2    SPM 80    Minutes 15    METs 3.45      T5 Nustep   Level 1    SPM 80    Minutes 15    METs 3.45      Track   Laps 25    Minutes 15    METs 2.36      Prescription Details   Frequency (times per week) 2    Duration Progress to 30 minutes of continuous aerobic without signs/symptoms of physical distress      Intensity   THRR 40-80% of Max Heartrate  96-130    Ratings of Perceived Exertion 11-13    Perceived Dyspnea 0-4      Progression   Progression Continue to progress workloads to maintain intensity without signs/symptoms of physical distress.      Resistance Training   Training Prescription No   Patient omitted weights becasue of BP response to walking. Patient forgot take BP meds today. Re-evaluate on first day when patient has taken meds to monotor BP reponse to exercise.            Perform Capillary Blood Glucose checks as needed.  Exercise Prescription Changes:   Exercise Prescription Changes     Row Name 03/04/22 1700             Response to Exercise   Heart Rate (Admit) 63 bpm       Heart Rate (Exercise) 108 bpm       Heart Rate (Exit) 73 bpm       Oxygen Saturation (Admit) 99 %       Oxygen Saturation (Exercise) 100 %       Oxygen Saturation (Exit) 100 %       Rating of Perceived Exertion (Exercise) 11       Perceived Dyspnea (Exercise) 0       Symptoms High BP resonse to exercise       Comments closely monitor BP repsonse to exercise when patient has taken meds                Exercise Comments:   Exercise Goals and Review:   Exercise Goals  Hazel Name 03/04/22 1729             Exercise Goals   Increase Physical Activity Yes       Intervention Provide advice, education, support and counseling about physical activity/exercise needs.;Develop an individualized exercise prescription for aerobic and resistive training based on initial evaluation findings, risk stratification, comorbidities and participant's personal goals.       Expected Outcomes Short Term: Attend rehab on a regular basis to increase amount of physical activity.;Long Term: Add in home exercise to make exercise part of routine and to increase amount of physical activity.;Long Term: Exercising regularly at least 3-5 days a week.       Increase Strength and Stamina Yes       Intervention Provide advice, education, support and  counseling about physical activity/exercise needs.;Develop an individualized exercise prescription for aerobic and resistive training based on initial evaluation findings, risk stratification, comorbidities and participant's personal goals.       Expected Outcomes Short Term: Increase workloads from initial exercise prescription for resistance, speed, and METs.;Short Term: Perform resistance training exercises routinely during rehab and add in resistance training at home;Long Term: Improve cardiorespiratory fitness, muscular endurance and strength as measured by increased METs and functional capacity (6MWT)       Able to understand and use rate of perceived exertion (RPE) scale Yes       Intervention Provide education and explanation on how to use RPE scale       Expected Outcomes Short Term: Able to use RPE daily in rehab to express subjective intensity level;Long Term:  Able to use RPE to guide intensity level when exercising independently       Able to understand and use Dyspnea scale Yes       Intervention Provide education and explanation on how to use Dyspnea scale       Expected Outcomes Short Term: Able to use Dyspnea scale daily in rehab to express subjective sense of shortness of breath during exertion;Long Term: Able to use Dyspnea scale to guide intensity level when exercising independently       Knowledge and understanding of Target Heart Rate Range (THRR) Yes       Intervention Provide education and explanation of THRR including how the numbers were predicted and where they are located for reference       Expected Outcomes Short Term: Able to state/look up THRR;Long Term: Able to use THRR to govern intensity when exercising independently;Short Term: Able to use daily as guideline for intensity in rehab       Able to check pulse independently Yes       Intervention Provide education and demonstration on how to check pulse in carotid and radial arteries.;Review the importance of being able to  check your own pulse for safety during independent exercise       Expected Outcomes Short Term: Able to explain why pulse checking is important during independent exercise;Long Term: Able to check pulse independently and accurately       Understanding of Exercise Prescription Yes       Intervention Provide education, explanation, and written materials on patient's individual exercise prescription       Expected Outcomes Short Term: Able to explain program exercise prescription;Long Term: Able to explain home exercise prescription to exercise independently                Exercise Goals Re-Evaluation :   Discharge Exercise Prescription (Final Exercise Prescription Changes):  Exercise Prescription Changes -  03/04/22 1700       Response to Exercise   Heart Rate (Admit) 63 bpm    Heart Rate (Exercise) 108 bpm    Heart Rate (Exit) 73 bpm    Oxygen Saturation (Admit) 99 %    Oxygen Saturation (Exercise) 100 %    Oxygen Saturation (Exit) 100 %    Rating of Perceived Exertion (Exercise) 11    Perceived Dyspnea (Exercise) 0    Symptoms High BP resonse to exercise    Comments closely monitor BP repsonse to exercise when patient has taken meds             Nutrition:  Target Goals: Understanding of nutrition guidelines, daily intake of sodium '1500mg'$ , cholesterol '200mg'$ , calories 30% from fat and 7% or less from saturated fats, daily to have 5 or more servings of fruits and vegetables.  Education: All About Nutrition: -Group instruction provided by verbal, written material, interactive activities, discussions, models, and posters to present general guidelines for heart healthy nutrition including fat, fiber, MyPlate, the role of sodium in heart healthy nutrition, utilization of the nutrition label, and utilization of this knowledge for meal planning. Follow up email sent as well. Written material given at graduation. Flowsheet Row Cardiac Rehab from 03/04/2022 in [facility] Cardiac and Pulmonary  Rehab  Education need identified 03/04/22       Biometrics:  Pre Biometrics - 03/04/22 1729       Pre Biometrics   Height '5\' 8"'$  (1.727 m)    Weight 175 lb 1.6 oz (79.4 kg)    BMI (Calculated) 26.63    Single Leg Stand 24 seconds              Nutrition Therapy Plan and Nutrition Goals:  Nutrition Therapy & Goals - 03/04/22 1744       Intervention Plan   Intervention Prescribe, educate and counsel regarding individualized specific dietary modifications aiming towards targeted core components such as weight, hypertension, lipid management, diabetes, heart failure and other comorbidities.    Expected Outcomes Short Term Goal: Understand basic principles of dietary content, such as calories, fat, sodium, cholesterol and nutrients.;Short Term Goal: A plan has been developed with personal nutrition goals set during dietitian appointment.;Long Term Goal: Adherence to prescribed nutrition plan.             Nutrition Assessments:  MEDIFICTS Score Key: ?70 Need to make dietary changes  40-70 Heart Healthy Diet ? 40 Therapeutic Level Cholesterol Diet  Flowsheet Row Cardiac Rehab from 03/04/2022 in [facility] Cardiac and Pulmonary Rehab  Picture Your Plate Total Score on Admission 58      Picture Your Plate Scores: <60 Unhealthy dietary pattern with much room for improvement. 41-50 Dietary pattern unlikely to meet recommendations for good health and room for improvement. 51-60 More healthful dietary pattern, with some room for improvement.  >60 Healthy dietary pattern, although there may be some specific behaviors that could be improved.    Nutrition Goals Re-Evaluation:   Nutrition Goals Discharge (Final Nutrition Goals Re-Evaluation):   Psychosocial: Target Goals: Acknowledge presence or absence of significant depression and/or stress, maximize coping skills, provide positive support system. Participant is able to verbalize types and ability to use techniques and skills  needed for reducing stress and depression.   Education: Stress, Anxiety, and Depression - Group verbal and visual presentation to define topics covered.  Reviews how body is impacted by stress, anxiety, and depression.  Also discusses healthy ways to reduce stress and to treat/manage anxiety and depression.  Written material given at graduation.   Education: Sleep Hygiene -Provides group verbal and written instruction about how sleep can affect your health.  Define sleep hygiene, discuss sleep cycles and impact of sleep habits. Review good sleep hygiene tips.    Initial Review & Psychosocial Screening:  Initial Psych Review & Screening - 02/18/22 1452       Initial Review   Current issues with None Identified      Family Dynamics   Good Support System? Yes   wife, 20 years,     Barriers   Psychosocial barriers to participate in program There are no identifiable barriers or psychosocial needs.      Screening Interventions   Interventions Encouraged to exercise;To provide support and resources with identified psychosocial needs;Provide feedback about the scores to participant    Expected Outcomes Short Term goal: Utilizing psychosocial counselor, staff and physician to assist with identification of specific Stressors or current issues interfering with healing process. Setting desired goal for each stressor or current issue identified.;Long Term Goal: Stressors or current issues are controlled or eliminated.;Short Term goal: Identification and review with participant of any Quality of Life or Depression concerns found by scoring the questionnaire.;Long Term goal: The participant improves quality of Life and PHQ9 Scores as seen by post scores and/or verbalization of changes             Quality of Life Scores:   Quality of Life - 03/04/22 1737       Quality of Life   Select Quality of Life      Quality of Life Scores   Health/Function Pre 19.07 %    Socioeconomic Pre 21.88 %     Psych/Spiritual Pre 26.86 %    Family Pre 26.86 %    GLOBAL Post 22.06 %            Scores of 19 and below usually indicate a poorer quality of life in these areas.  A difference of  2-3 points is a clinically meaningful difference.  A difference of 2-3 points in the total score of the Quality of Life Index has been associated with significant improvement in overall quality of life, self-image, physical symptoms, and general health in studies assessing change in quality of life.  PHQ-9: Review Flowsheet  More data may exist      03/04/2022 05/14/2018 01/06/2017 09/04/2016 08/21/2015  Depression screen PHQ 2/9  Decreased Interest 0 0 0 0 0  Down, Depressed, Hopeless 0 0 0 0 0  PHQ - 2 Score 0 0 0 0 0  Altered sleeping 1 0 - - -  Tired, decreased energy 1 0 - - -  Change in appetite 0 0 - - -  Feeling bad or failure about yourself  0 0 - - -  Trouble concentrating 1 0 - - -  Moving slowly or fidgety/restless 0 0 - - -  Suicidal thoughts 0 0 - - -  PHQ-9 Score 3 0 - - -  Difficult doing work/chores Somewhat difficult Not difficult at all - - -   Interpretation of Total Score  Total Score Depression Severity:  1-4 = Minimal depression, 5-9 = Mild depression, 10-14 = Moderate depression, 15-19 = Moderately severe depression, 20-27 = Severe depression   Psychosocial Evaluation and Intervention:  Psychosocial Evaluation - 02/18/22 1501       Psychosocial Evaluation & Interventions   Interventions Encouraged to exercise with the program and follow exercise prescription    Comments Ron has no  barriers to attending the program. He lives with his wife and she is his support. He wants to start driving amd I did advise him to check with his physician.  Ron has  recently quit tobaccouse. He wants to saty quit.   He is ready to get started with the program.    Expected Outcomes STTG Ron attends all scheduled session, he continue to remain without tobacco use. LTG Ron continues with his exercise  progression and remain tobacco free.    Continue Psychosocial Services  Follow up required by staff             Psychosocial Re-Evaluation:   Psychosocial Discharge (Final Psychosocial Re-Evaluation):   Vocational Rehabilitation: Provide vocational rehab assistance to qualifying candidates.   Vocational Rehab Evaluation & Intervention:   Education: Education Goals: Education classes will be provided on a variety of topics geared toward better understanding of heart health and risk factor modification. Participant will state understanding/return demonstration of topics presented as noted by education test scores.  Learning Barriers/Preferences:   General Cardiac Education Topics:  AED/CPR: - Group verbal and written instruction with the use of models to demonstrate the basic use of the AED with the basic ABC's of resuscitation.   Anatomy and Cardiac Procedures: - Group verbal and visual presentation and models provide information about basic cardiac anatomy and function. Reviews the testing methods done to diagnose heart disease and the outcomes of the test results. Describes the treatment choices: Medical Management, Angioplasty, or Coronary Bypass Surgery for treating various heart conditions including Myocardial Infarction, Angina, Valve Disease, and Cardiac Arrhythmias.  Written material given at graduation. Flowsheet Row Cardiac Rehab from 03/04/2022 in [facility] Cardiac and Pulmonary Rehab  Education need identified 03/04/22       Medication Safety: - Group verbal and visual instruction to review commonly prescribed medications for heart and lung disease. Reviews the medication, class of the drug, and side effects. Includes the steps to properly store meds and maintain the prescription regimen.  Written material given at graduation.   Intimacy: - Group verbal instruction through game format to discuss how heart and lung disease can affect sexual intimacy. Written material  given at graduation..   Know Your Numbers and Heart Failure: - Group verbal and visual instruction to discuss disease risk factors for cardiac and pulmonary disease and treatment options.  Reviews associated critical values for Overweight/Obesity, Hypertension, Cholesterol, and Diabetes.  Discusses basics of heart failure: signs/symptoms and treatments.  Introduces Heart Failure Zone chart for action plan for heart failure.  Written material given at graduation.   Infection Prevention: - Provides verbal and written material to individual with discussion of infection control including proper hand washing and proper equipment cleaning during exercise session. Flowsheet Row Cardiac Rehab from 03/04/2022 in [facility] Cardiac and Pul[facility] Rehab  Date 03/04/22  Educator [facility]  Instruction Review Code 1- Verbalizes Understanding       Falls Prevention: - Provides verbal and written material to individual with discussion of falls prevention and safety. Flowsheet Row Cardiac Rehab from 03/04/2022 in [facility] Cardiac and Pulmonary Rehab  Date 03/04/22  Educator [facility]  Instruction Review Code 1- Verbalizes Understanding       Other: -Provides group and verbal instruction on various topics (see comments)   Knowledge Questionnaire Score:  Knowledge Questionnaire Score - 03/04/22 1737       Knowledge Questionnaire Score   Pre Score 20/26             Core Components/Risk Factors/Patient Goals  at Admission:  Personal Goals and Risk Factors at Admission - 03/04/22 1733       Core Components/Risk Factors/Patient Goals on Admission    Weight Management Yes;Weight Gain    Intervention Weight Management: Develop a combined nutrition and exercise program designed to reach desired caloric intake, while maintaining appropriate intake of nutrient and fiber, sodium and fats, and appropriate energy expenditure required for the weight goal.;Weight Management: Provide education and appropriate resources to help  participant work on and attain dietary goals.    Admit Weight 175 lb 1.6 oz (79.4 kg)    Goal Weight: Short Term 180 lb (81.6 kg)    Goal Weight: Long Term 180 lb (81.6 kg)    Expected Outcomes Weight Maintenance: Understanding of the daily nutrition guidelines, which includes 25-35% calories from fat, 7% or less cal from saturated fats, less than '200mg'$  cholesterol, less than 1.5gm of sodium, & 5 or more servings of fruits and vegetables daily;Short Term: Continue to assess and modify interventions until short term weight is achieved;Long Term: Adherence to nutrition and physical activity/exercise program aimed toward attainment of established weight goal;Understanding recommendations for meals to include 15-35% energy as protein, 25-35% energy from fat, 35-60% energy from carbohydrates, less than '200mg'$  of dietary cholesterol, 20-35 gm of total fiber daily;Weight Gain: Understanding of general recommendations for a high calorie, high protein meal plan that promotes weight gain by distributing calorie intake throughout the day with the consumption for 4-5 meals, snacks, and/or supplements;Understanding of distribution of calorie intake throughout the day with the consumption of 4-5 meals/snacks    Tobacco Cessation Yes    Number of packs per day Tre is a recent tobacco user. Intervention for tobacco cessation was provided at the initial medical review. He was asked about readiness to quit and reported he quit a few weeks ago . Staff will continue to provide cessation encouragement and follow up with the patient throughout the program.    Intervention Assist the participant in steps to quit. Provide individualized education and counseling about committing to Tobacco Cessation, relapse prevention, and pharmacological support that can be provided by physician.;Advice worker, assist with locating and accessing local/national Quit Smoking programs, and support quit date choice.    Expected  Outcomes Short Term: Will demonstrate readiness to quit, by selecting a quit date.;Short Term: Will quit all tobacco product use, adhering to prevention of relapse plan.;Long Term: Complete abstinence from all tobacco products for at least 12 months from quit date.    Diabetes Yes    Intervention Provide education about signs/symptoms and action to take for hypo/hyperglycemia.;Provide education about proper nutrition, including hydration, and aerobic/resistive exercise prescription along with prescribed medications to achieve blood glucose in normal ranges: Fasting glucose 65-99 mg/dL    Expected Outcomes Short Term: Participant verbalizes understanding of the signs/symptoms and immediate care of hyper/hypoglycemia, proper foot care and importance of medication, aerobic/resistive exercise and nutrition plan for blood glucose control.;Long Term: Attainment of HbA1C < 7%.    Hypertension Yes    Intervention Provide education on lifestyle modifcations including regular physical activity/exercise, weight management, moderate sodium restriction and increased consumption of fresh fruit, vegetables, and low fat dairy, alcohol moderation, and smoking cessation.;Monitor prescription use compliance.    Expected Outcomes Short Term: Continued assessment and intervention until BP is < 140/22m HG in hypertensive participants. < 130/848mHG in hypertensive participants with diabetes, heart failure or chronic kidney disease.;Long Term: Maintenance of blood pressure at goal levels.    Lipids Yes  Intervention Provide education and support for participant on nutrition & aerobic/resistive exercise along with prescribed medications to achieve LDL '70mg'$ , HDL >'40mg'$ .    Expected Outcomes Short Term: Participant states understanding of desired cholesterol values and is compliant with medications prescribed. Participant is following exercise prescription and nutrition guidelines.;Long Term: Cholesterol controlled with  medications as prescribed, with individualized exercise RX and with personalized nutrition plan. Value goals: LDL < '70mg'$ , HDL > 40 mg.             Education:Diabetes - Individual verbal and written instruction to review signs/symptoms of diabetes, desired ranges of glucose level fasting, after meals and with exercise. Acknowledge that pre and post exercise glucose checks will be done for 3 sessions at entry of program. [place] from 03/04/2022 in [facility]  Date 03/04/22  Educator [facility]  Instruction Review Code 1- Verbalizes Understanding       Core Components/Risk Factors/Patient Goals Review:   Goals and Risk Factor Review     Row Name 03/04/22 1730             Core Components/Risk Factors/Patient Goals Review   Personal Goals Review Tobacco Cessation       Review Naksh Radi has recently quit tobacco use within the last 6 months. Intervention for relapse prevention was provided at the initial medical review. He was encouraged to continue to with tobacco cessation and was provided information on relapse prevention. Patient received information about combination therapy, tobacco cessation classes, quit line, and quit smoking apps in case of a relapse. Patient demonstrated understanding of this material.Staff will continue to provide encouragement and follow up with the patient throughout the program.       Expected Outcomes Short: continue no to smoke Long: maintain tobacco free lifestyle.                Core Components/Risk Factors/Patient Goals at Discharge (Final Review):   Goals and Risk Factor Review - 03/04/22 1730       Core Components/Risk Factors/Patient Goals Review   Personal Goals Review Tobacco Cessation    Review Hipolito Martinezlopez has recently quit tobacco use within the last 6 months. Intervention for relapse prevention was provided at the initial medical review. He was encouraged to continue to with tobacco cessation and  was provided information on relapse prevention. Patient received information about combination therapy, tobacco cessation classes, quit line, and quit smoking apps in case of a relapse. Patient demonstrated understanding of this material.Staff will continue to provide encouragement and follow up with the patient throughout the program.    Expected Outcomes Short: continue no to smoke Long: maintain tobacco free lifestyle.             ITP Comments:  ITP Comments     Row Name 02/18/22 1507 03/04/22 1732 03/13/22 1356       ITP Comments Virtual orientation call completed today. he has an appointment on Date: 03/04/2022  for EP eval and gym Orientation.  Documentation of diagnosis can be found in  [facility]  Date: 02/08/2022  Tejon is a recent tobacco user. Intervention for tobacco cessation was provided at the initial medical review. He was asked about readiness to quit and reported he quit a few weeks ago . Staff will continue to provide cessation encouragement and follow up with the patient throughout the program. Completed 6MWT and gym orientation. Initial ITP created and sent for review to Dr. Emily Filbert, Medical Director.Ron Whitham has recently quit  tobacco use within the last 6 months. Intervention for relapse prevention was provided at the initial medical review. He was encouraged to continue to with tobacco cessation and was provided information on relapse prevention. Patient received information about combination therapy, tobacco cessation classes, quit line, and quit smoking apps in case of a relapse. Patient demonstrated understanding of this material.Staff will continue to provide encouragement and follow up with the patient throughout the program.  Note was sent to Dr. Saunders Revel reguarding BP parameters during exercise for patient. 30 Day review completed. Medical Director ITP review done, changes made as directed, and signed approval by Medical Director.              Comments:

## 2022-03-13 NOTE — Patient Instructions (Signed)
Medication Instructions:  No changes at this time.   *If you need a refill on your cardiac medications before your next appointment, please call your pharmacy*   Lab Work: Lipid & LFT to be done today over at the [place] at [facility] then go to 1st desk on the right to check in (REGISTRATION)   Lab hours: Monday- Friday (7:30 am- 5:30 pm)  If you have labs (blood work) drawn today and your tests are completely normal, you will receive your results only by: MyChart Message (if you have MyChart) OR A paper copy in the mail If you have any lab test that is abnormal or we need to change your treatment, we will call you to review the results.   Testing/Procedures: None   Follow-Up: At [facility], you and your health needs are our priority.  As part of our continuing mission to provide you with exceptional heart care, we have created designated Provider Care Teams.  These Care Teams include your primary Cardiologist (physician) and Advanced Practice Providers (APPs -  Physician Assistants and Nurse Practitioners) who all work together to provide you with the care you need, when you need it.   Your next appointment:   3 month(s)  The format for your next appointment:   In Person  Provider:   Ida Rogue, MD or Murray Hodgkins, NP       Important Information About Sugar

## 2022-03-13 NOTE — Progress Notes (Signed)
Daily Session Note  Patient Details  Name: Daniel [facility] MRN: [id number] Date of Birth: 09/04/1951 Referring Provider:   Flowsheet Row Cardiac Rehab from 03/04/2022 in [facility] Re[facility]  Referring Provider Dr. End       Encounter Date: 03/13/2022  Check In:  Session Check In - 03/13/22 1738       Check-In   Supervising physician immediately available to respond to emergencies See telemetry face sheet for immediately available ER MD    Location [facility]-Cardiac & Pulmonary Rehab    Staff Present Susanne Bice, RN, BSN, CCRP;Melissa Caiola, RDN, LDN;Kara Langdon, MS, ASCM CEP, Exercise Physiologist    Virtual Visit No    Medication changes reported     No    Fall or balance concerns reported    No    Warm-up and Cool-down Performed on first and last piece of equipment    Resistance Training Performed Yes    VAD Patient? No    PAD/SET Patient? No      Pain Assessment   Currently in Pain? No/denies                Social History   Tobacco Use  Smoking Status Former   Packs/day: 0.20   Years: 50.00   Total pack years: 10.00   Types: Pipe, Cigarettes   Start date: 09/04/1966   Quit date: 02/08/2022   Years since quitting: 0.0  Smokeless Tobacco Never    Goals Met:  Exercise tolerated well Personal goals reviewed No report of concerns or symptoms today  Goals Unmet:  Not Applicable  Comments: First full day of exercise!  Patient was oriented to gym and equipment including functions, settings, policies, and procedures.  Patient's individual exercise prescription and treatment plan were reviewed.  All starting workloads were established based on the results of the 6 minute walk test done at initial orientation visit.  The plan for exercise progression was also introduced and progression will be customized based on patient's performance and goals.    Dr. Mark Miller is Medical Director for [facility].  Dr. Fuad Aleskerov is Medical  Director for [facility]. 

## 2022-03-14 DIAGNOSIS — Z955 Presence of coronary angioplasty implant and graft: Secondary | ICD-10-CM

## 2022-03-14 DIAGNOSIS — I214 Non-ST elevation (NSTEMI) myocardial infarction: Secondary | ICD-10-CM | POA: Diagnosis not present

## 2022-03-14 LAB — GLUCOSE, CAPILLARY
Glucose-Capillary: 105 mg/dL — ABNORMAL HIGH (ref 70–99)
Glucose-Capillary: 96 mg/dL (ref 70–99)

## 2022-03-14 NOTE — Progress Notes (Signed)
Daily Session Note  Patient Details  Name: Daniel [facility] MRN: [id number] Date of Birth: 02-12-1951 Referring Provider:   Flowsheet Row Cardiac Rehab from 03/04/2022 in [facility] Cardiac and Pulmonary Rehab  Referring Provider Dr. Saunders Revel       Encounter Date: 03/14/2022  Check In:  Session Check In - 03/14/22 1724       Check-In   Supervising physician immediately available to respond to emergencies See telemetry face sheet for immediately available ER MD    Location [facility] & [facility] Re[facility]    Staff Present Vida Rigger, RN, BSN;Joseph [place], RCP,RRT,BSRT;Kara [place], MS, ASCM CEP, Exercise Physiologist    Virtual Visit No    Medication changes reported     No    Fall or balance concerns reported    No    Tobacco Cessation No Change    Warm-up and Cool-down Performed on first and last piece of equipment    Resistance Training Performed Yes    VAD Patient? No    PAD/SET Patient? No      Pain Assessment   Currently in Pain? No/denies                Social History   Tobacco Use  Smoking Status Former   Packs/day: 0.20   Years: 50.00   Total pack years: 10.00   Types: Pipe, Cigarettes   Start date: 09/04/1966   Quit date: 02/08/2022   Years since quitting: 0.0  Smokeless Tobacco Never    Goals Met:  Independence with exercise equipment Exercise tolerated well No report of concerns or symptoms today Strength training completed today  Goals Unmet:  Not Applicable  Comments: Pt able to follow exercise prescription today without complaint.  Will continue to monitor for progression.   Dr. Emily Filbert is Medical Director for [place].  Dr. Ottie Glazier is Medical Director for [facility] Pulmonary Rehabilitation.

## 2022-03-14 NOTE — Telephone Encounter (Signed)
Patient seen in office yesterday. Testing has been scheduled and patient aware. Closing encounter.

## 2022-03-18 DIAGNOSIS — Z955 Presence of coronary angioplasty implant and graft: Secondary | ICD-10-CM

## 2022-03-18 DIAGNOSIS — I214 Non-ST elevation (NSTEMI) myocardial infarction: Secondary | ICD-10-CM | POA: Diagnosis not present

## 2022-03-18 NOTE — Progress Notes (Signed)
Daily Session Note  Patient Details  Name: Daniel [facility] MRN: [id number] Date of Birth: 05-03-1951 Referring Provider:   Flowsheet Row Cardiac Rehab from 03/04/2022 in [facility] Cardiac and Pulmonary Rehab  Referring Provider Dr. Saunders Revel       Encounter Date: 03/18/2022  Check In:  Session Check In - 03/18/22 1730       Check-In   Supervising physician immediately available to respond to emergencies See telemetry face sheet for immediately available ER MD    Location [facility]    Staff Present Earlean Shawl, BS, ACSM CEP, Exercise Physiologist;Joseph Tessie Fass, Fabio Neighbors, MPA, RN    Virtual Visit No    Medication changes reported     No    Fall or balance concerns reported    No    Tobacco Cessation No Change    Warm-up and Cool-down Performed on first and last piece of equipment    Resistance Training Performed Yes    VAD Patient? No    PAD/SET Patient? No      Pain Assessment   Currently in Pain? No/denies                Social History   Tobacco Use  Smoking Status Former   Packs/day: 0.20   Years: 50.00   Total pack years: 10.00   Types: Pipe, Cigarettes   Start date: 09/04/1966   Quit date: 02/08/2022   Years since quitting: 0.1  Smokeless Tobacco Never    Goals Met:  Independence with exercise equipment Exercise tolerated well No report of concerns or symptoms today Strength training completed today  Goals Unmet:  Not Applicable  Comments: Pt able to follow exercise prescription today without complaint.  Will continue to monitor for progression.    Dr. Emily Filbert is Medical Director for [place].  Dr. Ottie Glazier is Medical Director for [facility]monary [facility].

## 2022-03-20 DIAGNOSIS — Z955 Presence of coronary angioplasty implant and graft: Secondary | ICD-10-CM

## 2022-03-20 DIAGNOSIS — I214 Non-ST elevation (NSTEMI) myocardial infarction: Secondary | ICD-10-CM

## 2022-03-20 LAB — GLUCOSE, CAPILLARY
Glucose-Capillary: 114 mg/dL — ABNORMAL HIGH (ref 70–99)
Glucose-Capillary: 108 mg/dL — ABNORMAL HIGH (ref 70–99)

## 2022-03-20 NOTE — Progress Notes (Signed)
Daily Session Note  Patient Details  Name: Sanford Lindblad MRN: [id number] Date of Birth: 1950-12-07 Referring Provider:   Flowsheet Row Cardiac Rehab from 03/04/2022 in [facility] Cardiac and Pulmonary Rehab  Referring Provider Dr. Saunders Revel       Encounter Date: 03/20/2022  Check In:  Session Check In - 03/20/22 1726       Check-In   Supervising physician immediately available to respond to emergencies See telemetry face sheet for immediately available ER MD    Location [facility]    Staff Present Birdie Sons, MPA, RN;Joseph Lou Miner, MS, ASCM CEP, Exercise Physiologist    Virtual Visit No    Medication changes reported     No    Fall or balance concerns reported    No    Tobacco Cessation No Change    Warm-up and Cool-down Performed on first and last piece of equipment    Resistance Training Performed Yes    VAD Patient? No    PAD/SET Patient? No      Pain Assessment   Currently in Pain? No/denies                Social History   Tobacco Use  Smoking Status Former   Packs/day: 0.20   Years: 50.00   Total pack years: 10.00   Types: Pipe, Cigarettes   Start date: 09/04/1966   Quit date: 02/08/2022   Years since quitting: 0.1  Smokeless Tobacco Never    Goals Met:  Independence with exercise equipment Exercise tolerated well No report of concerns or symptoms today Strength training completed today  Goals Unmet:  Not Applicable  Comments: Pt able to follow exercise prescription today without complaint.  Will continue to monitor for progression.    Dr. Emily Filbert is Medical Director for [place].  Dr. Ottie Glazier is Medical Director for [facility] Pulmonary Rehabilitation.

## 2022-03-27 ENCOUNTER — Ambulatory Visit: Payer: Medicare Other | Admitting: Nurse Practitioner

## 2022-04-03 ENCOUNTER — Encounter: Payer: Medicare Other | Attending: Cardiovascular Disease

## 2022-04-03 DIAGNOSIS — Z955 Presence of coronary angioplasty implant and graft: Secondary | ICD-10-CM | POA: Diagnosis not present

## 2022-04-03 DIAGNOSIS — I214 Non-ST elevation (NSTEMI) myocardial infarction: Secondary | ICD-10-CM | POA: Diagnosis not present

## 2022-04-03 NOTE — Progress Notes (Signed)
Daily Session Note  Patient Details  Name: Trayden Brandy MRN: [id number] Date of Birth: 12-15-1950 Referring Provider:   Flowsheet Row Cardiac Rehab from 03/04/2022 in [facility] Cardiac and Pulmonary Rehab  Referring Provider Dr. Saunders Revel       Encounter Date: 04/03/2022  Check In:  Session Check In - 04/03/22 1731       Check-In   Supervising physician immediately available to respond to emergencies See telemetry face sheet for immediately available ER MD    Location [facility] Re[facility]    Staff Present Justin Mend, RCP,RRT,BSRT;Danett Palazzo [place], MPA, Nino Glow, MS, ASCM CEP, Exercise Physiologist;Melissa Caiola, RDN, LDN    Virtual Visit No    Medication changes reported     No    Fall or balance concerns reported    No    Tobacco Cessation Use Increase    Current number of cigarettes/nicotine per day     2    Warm-up and Cool-down Performed on first and last piece of equipment    Resistance Training Performed Yes    VAD Patient? No    PAD/SET Patient? No      Pain Assessment   Currently in Pain? No/denies                Social History   Tobacco Use  Smoking Status Former   Packs/day: 0.20   Years: 50.00   Total pack years: 10.00   Types: Pipe, Cigarettes   Start date: 09/04/1966   Quit date: 02/08/2022   Years since quitting: 0.1  Smokeless Tobacco Never    Goals Met:  Independence with exercise equipment Exercise tolerated well No report of concerns or symptoms today Strength training completed today  Goals Unmet:  Not Applicable  Comments: Pt able to follow exercise prescription today without complaint.  Will continue to monitor for progression.    Dr. Emily Filbert is Medical Director for [place].  Dr. Ottie Glazier is Medical Director for [facility] Pulmonary Rehabilitation.

## 2022-04-04 ENCOUNTER — Encounter: Payer: Medicare Other | Admitting: *Deleted

## 2022-04-04 DIAGNOSIS — I214 Non-ST elevation (NSTEMI) myocardial infarction: Secondary | ICD-10-CM | POA: Diagnosis not present

## 2022-04-04 DIAGNOSIS — Z955 Presence of coronary angioplasty implant and graft: Secondary | ICD-10-CM

## 2022-04-04 NOTE — Progress Notes (Signed)
Daily Session Note  Patient Details  Name: Daniel [facility]entes MRN: [id number] Date of Birth: 12-27-1950 Referring Provider:   Flowsheet Row Cardiac Rehab from 03/04/2022 in [facility] Cardiac and Pulmonary Rehab  Referring Provider Dr. Saunders Revel       Encounter Date: 04/04/2022  Check In:  Session Check In - 04/04/22 1716       Check-In   Supervising physician immediately available to respond to emergencies See telemetry face sheet for immediately available ER MD    Location [facility] Re[facility]    Staff Present Heath Lark, RN, BSN, CCRP;Joseph [place], [place]    Virtual Visit No    Medication changes reported     No    Fall or balance concerns reported    No    Warm-up and Cool-down Performed on first and last piece of equipment    Resistance Training Performed Yes    VAD Patient? No    PAD/SET Patient? No      Pain Assessment   Currently in Pain? No/denies                Social History   Tobacco Use  Smoking Status Former   Packs/day: 0.20   Years: 50.00   Total pack years: 10.00   Types: Pipe, Cigarettes   Start date: 09/04/1966   Quit date: 02/08/2022   Years since quitting: 0.1  Smokeless Tobacco Never    Goals Met:  Independence with exercise equipment Exercise tolerated well No report of concerns or symptoms today  Goals Unmet:  Not Applicable  Comments: Pt able to follow exercise prescription today without complaint.  Will continue to monitor for progression.    Dr. Emily Filbert is Medical Director for [place].  Dr. Ottie Glazier is Medical Director for [facility] Pulmonary Rehabilitation.

## 2022-04-09 DIAGNOSIS — I214 Non-ST elevation (NSTEMI) myocardial infarction: Secondary | ICD-10-CM

## 2022-04-10 ENCOUNTER — Encounter: Payer: Self-pay | Admitting: *Deleted

## 2022-04-10 DIAGNOSIS — I214 Non-ST elevation (NSTEMI) myocardial infarction: Secondary | ICD-10-CM | POA: Diagnosis not present

## 2022-04-10 DIAGNOSIS — Z955 Presence of coronary angioplasty implant and graft: Secondary | ICD-10-CM

## 2022-04-10 NOTE — Progress Notes (Signed)
Daily Session Note  Patient Details  Name: Bracken Moffa MRN: [id number] Date of Birth: 03-26-1951 Referring Provider:   Flowsheet Row Cardiac Rehab from 03/04/2022 in [facility] Cardiac and Pulmonary Rehab  Referring Provider Dr. Saunders Revel       Encounter Date: 04/10/2022  Check In:  Session Check In - 04/10/22 1713       Check-In   Supervising physician immediately available to respond to emergencies See telemetry face sheet for immediately available ER MD    Location [facility]    Staff Present Justin Mend, RCP,RRT,BSRT;Shirlean Berman, MPA, RN;Melissa Orofino, RDN, LDN    Virtual Visit No    Medication changes reported     No    Fall or balance concerns reported    No    Tobacco Cessation No Change    Warm-up and Cool-down Performed on first and last piece of equipment    Resistance Training Performed Yes    VAD Patient? No    PAD/SET Patient? No      Pain Assessment   Currently in Pain? No/denies                Social History   Tobacco Use  Smoking Status Former   Packs/day: 0.20   Years: 50.00   Total pack years: 10.00   Types: Pipe, Cigarettes   Start date: 09/04/1966   Quit date: 02/08/2022   Years since quitting: 0.1  Smokeless Tobacco Never    Goals Met:  Independence with exercise equipment Exercise tolerated well No report of concerns or symptoms today Strength training completed today  Goals Unmet:  Not Applicable  Comments: Pt able to follow exercise prescription today without complaint.  Will continue to monitor for progression.    Dr. Emily Filbert is Medical Director for [place].  Dr. Ottie Glazier is Medical Director for [place] [facility].

## 2022-04-10 NOTE — Progress Notes (Signed)
Cardiac Individual Treatment Plan  Patient Details  Name: Daniel Fuentes MRN: [id number] Date of Birth: 15-Oct-1950 Referring Provider:   Flowsheet Row Cardiac Rehab from 03/04/2022 in [facility] Cardiac and Pulmonary Rehab  Referring Provider Dr. Saunders Revel       Initial Encounter Date:  Flowsheet Row Cardiac Rehab from 03/04/2022 in [facility] Cardiac and Pulmonary Rehab  Date 03/04/22       Visit Diagnosis: NSTEMI (non-ST elevated myocardial infarction) [facility])  Status post coronary artery stent placement  Patient's Home Medications on Admission:  Current Outpatient Medications:    amLODipine (NORVASC) 10 MG tablet, Take 1 tablet (10 mg total) by mouth daily., Disp: 90 tablet, Rfl: 3   aspirin EC 81 MG tablet, Take 1 tablet (81 mg total) by mouth daily., Disp: 30 tablet, Rfl: 0   carvedilol (COREG) 6.25 MG tablet, Take 1 tablet (6.25 mg total) by mouth 2 (two) times daily with a meal., Disp: 180 tablet, Rfl: 3   Cholecalciferol (VITAMIN D3) 2000 units capsule, Take 2,000 Units by mouth daily., Disp: , Rfl:    Evolocumab (REPATHA SURECLICK) 919 MG/ML SOAJ, Inject 140 mg into the skin every 14 (fourteen) days., Disp: 2 mL, Rfl: 11   losartan (COZAAR) 25 MG tablet, Take 1 tablet (25 mg total) by mouth daily., Disp: 90 tablet, Rfl: 3   metFORMIN (GLUCOPHAGE) 1000 MG tablet, Take 1 tablet (1,000 mg total) by mouth daily. Start tomorrow, Disp: , Rfl:    Multiple Vitamins-Minerals (MULTIVITAMIN WITH MINERALS) tablet, Take 1 tablet by mouth daily., Disp: , Rfl:    nitroGLYCERIN (NITROSTAT) 0.4 MG SL tablet, Place 1 tablet (0.4 mg total) under the tongue every 5 (five) minutes as needed for chest pain., Disp: 30 tablet, Rfl: 0   solifenacin (VESICARE) 10 MG tablet, Take 10 mg by mouth daily., Disp: , Rfl:    tamsulosin (FLOMAX) 0.4 MG CAPS capsule, Take 0.4 mg by mouth daily., Disp: , Rfl:    vitamin C (ASCORBIC ACID) 500 MG tablet, Take 500 mg by mouth., Disp: , Rfl:   Past Medical History: Past Medical  History:  Diagnosis Date   CAD (coronary artery disease)    a. 01/2022 NSTEMI/PCI: LM nl, LAD 22m, D1 50, D3 90, LCX 14m, 95/60d, OM2 80, RCA 100p/m, 15m, 70d (3.5x12, 3.0x38, & 3.5x8 Onyx Frontier DES), RPAV1 90 (2.0x12 Onyx Frontier DES), RPAV2 99, RPL1 50, RPL2 99. EF 50-55%; b. 01/2022 Staged PCI: LCX (3.5x18 & 3.0x38 Onyx Frontier DES), OM2 (3.0x22 Onyx Frontier DES).   CKD (chronic kidney disease), stage II    Diabetes mellitus without complication ([place])    Hyperlipidemia    Hypertension    Ischemic cardiomyopathy    a. 01/2022 Echo: EF 50-55%, mod LVH, GRI DD, mild basal-mid inf/infsept HK. Mildly reduced RV fxn, Mildly dil RA. Mild MR. MIld-mod TR. AoV sclerosis w/o stenosis.    Tobacco Use: Social History   Tobacco Use  Smoking Status Former   Packs/day: 0.20   Years: 50.00   Total pack years: 10.00   Types: Pipe, Cigarettes   Start date: 09/04/1966   Quit date: 02/08/2022   Years since quitting: 0.1  Smokeless Tobacco Never    Labs: Review Flowsheet  More data exists      Latest Ref Rng & Units 05/27/2017 12/26/2017 05/14/2018 02/08/2022 03/13/2022  Labs for ITP Cardiac and Pulmonary Rehab  Cholestrol 0 - 200 mg/dL 187  195  - 181  142   LDL (calc) 0 - 99 mg/dL 125  130  -  110  82   HDL-C >40 mg/dL 40  37  - 48  49   Trlycerides <150 mg/dL 112  158  - 114  53   Hemoglobin A1c 4.8 - 5.6 % - 8.4  7.1  5.9  -     Exercise Target Goals: Exercise Program Goal: Individual exercise prescription set using results from initial 6 min walk test and THRR while considering  patient's activity barriers and safety.   Exercise Prescription Goal: Initial exercise prescription builds to 30-45 minutes a day of aerobic activity, 2-3 days per week.  Home exercise guidelines will be given to patient during program as part of exercise prescription that the participant will acknowledge.   Education: Aerobic Exercise: - Group verbal and visual presentation on the components of exercise  prescription. Introduces F.I.T.T principle from ACSM for exercise prescriptions.  Reviews F.I.T.T. principles of aerobic exercise including progression. Written material given at graduation.   Education: Resistance Exercise: - Group verbal and visual presentation on the components of exercise prescription. Introduces F.I.T.T principle from ACSM for exercise prescriptions  Reviews F.I.T.T. principles of resistance exercise including progression. Written material given at graduation.    Education: Exercise & Equipment Safety: - Individual verbal instruction and demonstration of equipment use and safety with use of the equipment. Flowsheet Row Cardiac Rehab from 03/04/2022 in [facility] Cardiac and Pulmonary Rehab  Date 03/04/22  Educator [facility]  Instruction Review Code 1- Verbalizes Understanding       Education: Exercise Physiology & General Exercise Guidelines: - Group verbal and written instruction with models to review the exercise physiology of the cardiovascular system and associated critical values. Provides general exercise guidelines with specific guidelines to those with heart or lung disease.  Flowsheet Row Cardiac Rehab from 03/04/2022 in [facility] Cardiac and Pulmonary Rehab  Education need identified 03/04/22       Education: Flexibility, Balance, Mind/Body Relaxation: - Group verbal and visual presentation with interactive activity on the components of exercise prescription. Introduces F.I.T.T principle from ACSM for exercise prescriptions. Reviews F.I.T.T. principles of flexibility and balance exercise training including progression. Also discusses the mind body connection.  Reviews various relaxation techniques to help reduce and manage stress (i.e. Deep breathing, progressive muscle relaxation, and visualization). Balance handout provided to take home. Written material given at graduation.   Activity Barriers & Risk Stratification:  Activity Barriers & Cardiac Risk Stratification -  02/18/22 1451       Activity Barriers & Cardiac Risk Stratification   Activity Barriers None    Cardiac Risk Stratification Moderate             6 Minute Walk:  6 Minute Walk     Row Name 03/04/22 1721         6 Minute Walk   Phase Initial     Distance 1215 feet     Walk Time 6 minutes     # of Rest Breaks 0     MPH 2.3     METS 3.45     RPE 11     Perceived Dyspnea  0     VO2 Peak 12.06     Symptoms Yes (comment)     Comments High BP response to 6 MWT (220/100). Patient forgot to take meds today.     Resting HR 63 bpm     Resting BP 150/80     Resting Oxygen Saturation  99 %     Exercise Oxygen Saturation  during 6 min walk 100 %  Max Ex. HR 108 bpm     Max Ex. BP 220/100     2 Minute Post BP 188/80  After further rest patient BP came down to 160/80              Oxygen Initial Assessment:   Oxygen Re-Evaluation:   Oxygen Discharge (Final Oxygen Re-Evaluation):   Initial Exercise Prescription:  Initial Exercise Prescription - 03/04/22 1700       Date of Initial Exercise RX and Referring Provider   Date 03/04/22    Referring Provider Dr. Saunders Revel      Oxygen   Maintain Oxygen Saturation 88% or higher      Treadmill   MPH 108    Grade 0    Minutes 15    METs 2.38      Recumbant Bike   Level 1    RPM 60    Minutes 15    METs 3.45      NuStep   Level 2    SPM 80    Minutes 15    METs 3.45      T5 Nustep   Level 1    SPM 80    Minutes 15    METs 3.45      Track   Laps 25    Minutes 15    METs 2.36      Prescription Details   Frequency (times per week) 2    Duration Progress to 30 minutes of continuous aerobic without signs/symptoms of physical distress      Intensity   THRR 40-80% of Max Heartrate 96-130    Ratings of Perceived Exertion 11-13    Perceived Dyspnea 0-4      Progression   Progression Continue to progress workloads to maintain intensity without signs/symptoms of physical distress.      Resistance Training    Training Prescription No   Patient omitted weights becasue of BP response to walking. Patient forgot take BP meds today. Re-evaluate on first day when patient has taken meds to monotor BP reponse to exercise.            Perform Capillary Blood Glucose checks as needed.  Exercise Prescription Changes:   Exercise Prescription Changes     Row Name 03/04/22 1700 03/20/22 0900 04/01/22 1500         Response to Exercise   Blood Pressure (Admit) -- 154/78 122/60     Blood Pressure (Exercise) -- 160/78 156/76     Blood Pressure (Exit) -- 108/60 118/60     Heart Rate (Admit) 63 bpm 72 bpm 68 bpm     Heart Rate (Exercise) 108 bpm 103 bpm 115 bpm     Heart Rate (Exit) 73 bpm 71 bpm 75 bpm     Oxygen Saturation (Admit) 99 % -- --     Oxygen Saturation (Exercise) 100 % -- --     Oxygen Saturation (Exit) 100 % -- --     Rating of Perceived Exertion (Exercise) 11 13 13      Perceived Dyspnea (Exercise) 0 -- --     Symptoms High BP resonse to exercise none none     Comments closely monitor BP repsonse to exercise when patient has taken meds 2nd full day of exercise --     Duration -- Progress to 30 minutes of  aerobic without signs/symptoms of physical distress Progress to 30 minutes of  aerobic without signs/symptoms of physical distress     Intensity -- THRR unchanged  THRR unchanged       Progression   Progression -- Continue to progress workloads to maintain intensity without signs/symptoms of physical distress. Continue to progress workloads to maintain intensity without signs/symptoms of physical distress.     Average METs -- 2.4 2.47       Resistance Training   Training Prescription -- Yes  BP has been maintained; patient taking meds Yes     Weight -- 4 lb 4 lb     Reps -- 10-15 10-15       Interval Training   Interval Training -- No No       Treadmill   MPH -- -- 1.8     Grade -- -- 0     Minutes -- -- 15     METs -- -- 1.77       NuStep   Level -- 2 2     Minutes --  15 15     METs -- 2.5 2.5       REL-XR   Level -- -- 2     Minutes -- -- 15     METs -- -- 3.1       Track   Laps -- 25 28     Minutes -- 15 15     METs -- 2.36 2.52       Oxygen   Maintain Oxygen Saturation -- 88% or higher 88% or higher              Exercise Comments:   Exercise Comments     Row Name 03/13/22 1739           Exercise Comments First full day of exercise!  Patient was oriented to gym and equipment including functions, settings, policies, and procedures.  Patient's individual exercise prescription and treatment plan were reviewed.  All starting workloads were established based on the results of the 6 minute walk test done at initial orientation visit.  The plan for exercise progression was also introduced and progression will be customized based on patient's performance and goals.                Exercise Goals and Review:   Exercise Goals     Row Name 03/04/22 1729             Exercise Goals   Increase Physical Activity Yes       Intervention Provide advice, education, support and counseling about physical activity/exercise needs.;Develop an individualized exercise prescription for aerobic and resistive training based on initial evaluation findings, risk stratification, comorbidities and participant's personal goals.       Expected Outcomes Short Term: Attend rehab on a regular basis to increase amount of physical activity.;Long Term: Add in home exercise to make exercise part of routine and to increase amount of physical activity.;Long Term: Exercising regularly at least 3-5 days a week.       Increase Strength and Stamina Yes       Intervention Provide advice, education, support and counseling about physical activity/exercise needs.;Develop an individualized exercise prescription for aerobic and resistive training based on initial evaluation findings, risk stratification, comorbidities and participant's personal goals.       Expected Outcomes  Short Term: Increase workloads from initial exercise prescription for resistance, speed, and METs.;Short Term: Perform resistance training exercises routinely during rehab and add in resistance training at home;Long Term: Improve cardiorespiratory fitness, muscular endurance and strength as measured by increased METs and functional capacity (6MWT)  Able to understand and use rate of perceived exertion (RPE) scale Yes       Intervention Provide education and explanation on how to use RPE scale       Expected Outcomes Short Term: Able to use RPE daily in rehab to express subjective intensity level;Long Term:  Able to use RPE to guide intensity level when exercising independently       Able to understand and use Dyspnea scale Yes       Intervention Provide education and explanation on how to use Dyspnea scale       Expected Outcomes Short Term: Able to use Dyspnea scale daily in rehab to express subjective sense of shortness of breath during exertion;Long Term: Able to use Dyspnea scale to guide intensity level when exercising independently       Knowledge and understanding of Target Heart Rate Range (THRR) Yes       Intervention Provide education and explanation of THRR including how the numbers were predicted and where they are located for reference       Expected Outcomes Short Term: Able to state/look up THRR;Long Term: Able to use THRR to govern intensity when exercising independently;Short Term: Able to use daily as guideline for intensity in rehab       Able to check pulse independently Yes       Intervention Provide education and demonstration on how to check pulse in carotid and radial arteries.;Review the importance of being able to check your own pulse for safety during independent exercise       Expected Outcomes Short Term: Able to explain why pulse checking is important during independent exercise;Long Term: Able to check pulse independently and accurately       Understanding of Exercise  Prescription Yes       Intervention Provide education, explanation, and written materials on patient's individual exercise prescription       Expected Outcomes Short Term: Able to explain program exercise prescription;Long Term: Able to explain home exercise prescription to exercise independently                Exercise Goals Re-Evaluation :  Exercise Goals Re-Evaluation     Row Name 03/13/22 1739 03/20/22 0948 04/01/22 1533         Exercise Goal Re-Evaluation   Exercise Goals Review Able to understand and use rate of perceived exertion (RPE) scale;Able to understand and use Dyspnea scale;Knowledge and understanding of Target Heart Rate Range (THRR);Understanding of Exercise Prescription Understanding of Exercise Prescription;Increase Physical Activity;Increase Strength and Stamina Understanding of Exercise Prescription;Increase Physical Activity;Increase Strength and Stamina     Comments Reviewed RPE and dyspnea scales, THR and program prescription with pt today.  Pt voiced understanding and was given a copy of goals to take home. Daniel is doing well in rehab for the first couple of sessions that he has been here. He had a hypertensive response to exercise during his 6MWT and has had better BP responses during his rehab sessions as he has been taking his medications. He was able to follow his exercise prescription and will continue to monitor while he furthers in the program. Daniel continues to do well in rehab. He increased up to 28 laps on the track. He improved to 2.47 average METs as well. He has also tolerated using 4 lb hand weights for resistance training. We will continue to monitor his progress in the program.     Expected Outcomes Short: Use RPE daily to regulate intensity. Long: Follow  program prescription in THR. Short: Follow current exercise prescription and continue to stay compliant with BP medications Long: Increase overall strength and stamina Short: Continue to increase laps on  the track. Long: Continue to improve overall MET levels.              Discharge Exercise Prescription (Final Exercise Prescription Changes):  Exercise Prescription Changes - 04/01/22 1500       Response to Exercise   Blood Pressure (Admit) 122/60    Blood Pressure (Exercise) 156/76    Blood Pressure (Exit) 118/60    Heart Rate (Admit) 68 bpm    Heart Rate (Exercise) 115 bpm    Heart Rate (Exit) 75 bpm    Rating of Perceived Exertion (Exercise) 13    Symptoms none    Duration Progress to 30 minutes of  aerobic without signs/symptoms of physical distress    Intensity THRR unchanged      Progression   Progression Continue to progress workloads to maintain intensity without signs/symptoms of physical distress.    Average METs 2.47      Resistance Training   Training Prescription Yes    Weight 4 lb    Reps 10-15      Interval Training   Interval Training No      Treadmill   MPH 1.8    Grade 0    Minutes 15    METs 1.77      NuStep   Level 2    Minutes 15    METs 2.5      REL-XR   Level 2    Minutes 15    METs 3.1      Track   Laps 28    Minutes 15    METs 2.52      Oxygen   Maintain Oxygen Saturation 88% or higher             Nutrition:  Target Goals: Understanding of nutrition guidelines, daily intake of sodium '1500mg'$ , cholesterol '200mg'$ , calories 30% from fat and 7% or less from saturated fats, daily to have 5 or more servings of fruits and vegetables.  Education: All About Nutrition: -Group instruction provided by verbal, written material, interactive activities, discussions, models, and posters to present general guidelines for heart healthy nutrition including fat, fiber, MyPlate, the role of sodium in heart healthy nutrition, utilization of the nutrition label, and utilization of this knowledge for meal planning. Follow up email sent as well. Written material given at graduation. Flowsheet Row Cardiac Rehab from 03/04/2022 in [facility] Cardiac and  Pulmonary Rehab  Education need identified 03/04/22       Biometrics:  Pre Biometrics - 03/04/22 1729       Pre Biometrics   Height $Remov'5\' 8"'GmspIT$  (1.727 m)    Weight 175 lb 1.6 oz (79.4 kg)    BMI (Calculated) 26.63    Single Leg Stand 24 seconds              Nutrition Therapy Plan and Nutrition Goals:  Nutrition Therapy & Goals - 03/04/22 1744       Intervention Plan   Intervention Prescribe, educate and counsel regarding individualized specific dietary modifications aiming towards targeted core components such as weight, hypertension, lipid management, diabetes, heart failure and other comorbidities.    Expected Outcomes Short Term Goal: Understand basic principles of dietary content, such as calories, fat, sodium, cholesterol and nutrients.;Short Term Goal: A plan has been developed with personal nutrition goals set during dietitian appointment.;Long Term Goal:  Adherence to prescribed nutrition plan.             Nutrition Assessments:  MEDIFICTS Score Key: ?70 Need to make dietary changes  40-70 Heart Healthy Diet ? 40 Therapeutic Level Cholesterol Diet  Flowsheet Row Cardiac Rehab from 03/04/2022 in [facility] Cardiac and Pulmonary Rehab  Picture Your Plate Total Score on Admission 58      Picture Your Plate Scores: <56 Unhealthy dietary pattern with much room for improvement. 41-50 Dietary pattern unlikely to meet recommendations for good health and room for improvement. 51-60 More healthful dietary pattern, with some room for improvement.  >60 Healthy dietary pattern, although there may be some specific behaviors that could be improved.    Nutrition Goals Re-Evaluation:  Nutrition Goals Re-Evaluation     [place] Name 04/04/22 1737             Goals   Current Weight 173 lb (78.5 kg)       Nutrition Goal Work on foods that he needs to eat.       Comment He would like to meet the dietitian.       Expected Outcome Short: meet with dietitian. Long: maintain a diet  that he can adhere to.                Nutrition Goals Discharge (Final Nutrition Goals Re-Evaluation):  Nutrition Goals Re-Evaluation - 04/04/22 1737       Goals   Current Weight 173 lb (78.5 kg)    Nutrition Goal Work on foods that he needs to eat.    Comment He would like to meet the dietitian.    Expected Outcome Short: meet with dietitian. Long: maintain a diet that he can adhere to.             Psychosocial: Target Goals: Acknowledge presence or absence of significant depression and/or stress, maximize coping skills, provide positive support system. Participant is able to verbalize types and ability to use techniques and skills needed for reducing stress and depression.   Education: Stress, Anxiety, and Depression - Group verbal and visual presentation to define topics covered.  Reviews how body is impacted by stress, anxiety, and depression.  Also discusses healthy ways to reduce stress and to treat/manage anxiety and depression.  Written material given at graduation.   Education: Sleep Hygiene -Provides group verbal and written instruction about how sleep can affect your health.  Define sleep hygiene, discuss sleep cycles and impact of sleep habits. Review good sleep hygiene tips.    Initial Review & Psychosocial Screening:  Initial Psych Review & Screening - 02/18/22 1452       Initial Review   Current issues with None Identified      Family Dynamics   Good Support System? Yes   wife, 20 years,     Barriers   Psychosocial barriers to participate in program There are no identifiable barriers or psychosocial needs.      Screening Interventions   Interventions Encouraged to exercise;To provide support and resources with identified psychosocial needs;Provide feedback about the scores to participant    Expected Outcomes Short Term goal: Utilizing psychosocial counselor, staff and physician to assist with identification of specific Stressors or current issues  interfering with healing process. Setting desired goal for each stressor or current issue identified.;Long Term Goal: Stressors or current issues are controlled or eliminated.;Short Term goal: Identification and review with participant of any Quality of Life or Depression concerns found by scoring the questionnaire.;Long Term goal: The  participant improves quality of Life and PHQ9 Scores as seen by post scores and/or verbalization of changes             Quality of Life Scores:   Quality of Life - 03/04/22 1737       Quality of Life   Select Quality of Life      Quality of Life Scores   Health/Function Pre 19.07 %    Socioeconomic Pre 21.88 %    Psych/Spiritual Pre 26.86 %    Family Pre 26.86 %    GLOBAL Post 22.06 %            Scores of 19 and below usually indicate a poorer quality of life in these areas.  A difference of  2-3 points is a clinically meaningful difference.  A difference of 2-3 points in the total score of the Quality of Life Index has been associated with significant improvement in overall quality of life, self-image, physical symptoms, and general health in studies assessing change in quality of life.  PHQ-9: Review Flowsheet  More data may exist      03/04/2022 05/14/2018 01/06/2017 09/04/2016 08/21/2015  Depression screen PHQ 2/9  Decreased Interest 0 0 0 0 0  Down, Depressed, Hopeless 0 0 0 0 0  PHQ - 2 Score 0 0 0 0 0  Altered sleeping 1 0 - - -  Tired, decreased energy 1 0 - - -  Change in appetite 0 0 - - -  Feeling bad or failure about yourself  0 0 - - -  Trouble concentrating 1 0 - - -  Moving slowly or fidgety/restless 0 0 - - -  Suicidal thoughts 0 0 - - -  PHQ-9 Score 3 0 - - -  Difficult doing work/chores Somewhat difficult Not difficult at all - - -   Interpretation of Total Score  Total Score Depression Severity:  1-4 = Minimal depression, 5-9 = Mild depression, 10-14 = Moderate depression, 15-19 = Moderately severe depression, 20-27 =  Severe depression   Psychosocial Evaluation and Intervention:  Psychosocial Evaluation - 02/18/22 1501       Psychosocial Evaluation & Interventions   Interventions Encouraged to exercise with the program and follow exercise prescription    Comments Daniel has no barriers to attending the program. He lives with his wife and she is his support. He wants to start driving amd I did advise him to check with his physician.  Daniel has  recently quit tobaccouse. He wants to saty quit.   He is ready to get started with the program.    Expected Outcomes STTG Daniel attends all scheduled session, he continue to remain without tobacco use. LTG Daniel continues with his exercise progression and remain tobacco free.    Continue Psychosocial Services  Follow up required by staff             Psychosocial Re-Evaluation:  Psychosocial Re-Evaluation     [place] Name 04/04/22 1744             Psychosocial Re-Evaluation   Current issues with None Identified       Comments Patient reports no issues with their current mental states, sleep, stress, depression or anxiety. Will follow up with patient in a few weeks for any changes.       Expected Outcomes Short: Continue to exercise regularly to support mental health and notify staff of any changes. Long: maintain mental health and well being through teaching of rehab or prescribed  medications independently.       Interventions Encouraged to attend Cardiac Rehabilitation for the exercise       Continue Psychosocial Services  Follow up required by staff                Psychosocial Discharge (Final Psychosocial Re-Evaluation):  Psychosocial Re-Evaluation - 04/04/22 1744       Psychosocial Re-Evaluation   Current issues with None Identified    Comments Patient reports no issues with their current mental states, sleep, stress, depression or anxiety. Will follow up with patient in a few weeks for any changes.    Expected Outcomes Short: Continue to exercise  regularly to support mental health and notify staff of any changes. Long: maintain mental health and well being through teaching of rehab or prescribed medications independently.    Interventions Encouraged to attend Cardiac Rehabilitation for the exercise    Continue Psychosocial Services  Follow up required by staff             Vocational Rehabilitation: Provide vocational rehab assistance to qualifying candidates.   Vocational Rehab Evaluation & Intervention:   Education: Education Goals: Education classes will be provided on a variety of topics geared toward better understanding of heart health and risk factor modification. Participant will state understanding/return demonstration of topics presented as noted by education test scores.  Learning Barriers/Preferences:   General Cardiac Education Topics:  AED/CPR: - Group verbal and written instruction with the use of models to demonstrate the basic use of the AED with the basic ABC's of resuscitation.   Anatomy and Cardiac Procedures: - Group verbal and visual presentation and models provide information about basic cardiac anatomy and function. Reviews the testing methods done to diagnose heart disease and the outcomes of the test results. Describes the treatment choices: Medical Management, Angioplasty, or Coronary Bypass Surgery for treating various heart conditions including Myocardial Infarction, Angina, Valve Disease, and Cardiac Arrhythmias.  Written material given at graduation. Flowsheet Row Cardiac Rehab from 03/04/2022 in [facility] Cardiac and Pulmonary Rehab  Education need identified 03/04/22       Medication Safety: - Group verbal and visual instruction to review commonly prescribed medications for heart and lung disease. Reviews the medication, class of the drug, and side effects. Includes the steps to properly store meds and maintain the prescription regimen.  Written material given at graduation.   Intimacy: -  Group verbal instruction through game format to discuss how heart and lung disease can affect sexual intimacy. Written material given at graduation..   Know Your Numbers and Heart Failure: - Group verbal and visual instruction to discuss disease risk factors for cardiac and pulmonary disease and treatment options.  Reviews associated critical values for Overweight/Obesity, Hypertension, Cholesterol, and Diabetes.  Discusses basics of heart failure: signs/symptoms and treatments.  Introduces Heart Failure Zone chart for action plan for heart failure.  Written material given at graduation.   Infection Prevention: - Provides verbal and written material to individual with discussion of infection control including proper hand washing and proper equipment cleaning during exercise session. Flowsheet Row Cardiac Rehab from 03/04/2022 in [facility] Cardiac and Pulmonary Rehab  Date 03/04/22  Educator [facility]  Instruction Review Code 1- Verbalizes Understanding       Falls Prevention: - Provides verbal and written material to individual with discussion of falls prevention and safety. Flowsheet Row Cardiac Rehab from 03/04/2022 in [facility] Cardiac and Pulmonary Rehab  Date 03/04/22  Educator [facility]  Instruction Review Code 1- [employer]  Other: -Provides group and verbal instruction on various topics (see comments)   Knowledge Questionnaire Score:  Knowledge Questionnaire Score - 03/04/22 1737       Knowledge Questionnaire Score   Pre Score 20/26             Core Components/Risk Factors/Patient Goals at Admission:  Personal Goals and Risk Factors at Admission - 03/04/22 1733       Core Components/Risk Factors/Patient Goals on Admission    Weight Management Yes;Weight Gain    Intervention Weight Management: Develop a combined nutrition and exercise program designed to reach desired caloric intake, while maintaining appropriate intake of nutrient and fiber, sodium and fats, and  appropriate energy expenditure required for the weight goal.;Weight Management: Provide education and appropriate resources to help participant work on and attain dietary goals.    Admit Weight 175 lb 1.6 oz (79.4 kg)    Goal Weight: Short Term 180 lb (81.6 kg)    Goal Weight: Long Term 180 lb (81.6 kg)    Expected Outcomes Weight Maintenance: Understanding of the daily nutrition guidelines, which includes 25-35% calories from fat, 7% or less cal from saturated fats, less than $RemoveB'200mg'QWoARgCm$  cholesterol, less than 1.5gm of sodium, & 5 or more servings of fruits and vegetables daily;Short Term: Continue to assess and modify interventions until short term weight is achieved;Long Term: Adherence to nutrition and physical activity/exercise program aimed toward attainment of established weight goal;Understanding recommendations for meals to include 15-35% energy as protein, 25-35% energy from fat, 35-60% energy from carbohydrates, less than $RemoveB'200mg'hERjxSIp$  of dietary cholesterol, 20-35 gm of total fiber daily;Weight Gain: Understanding of general recommendations for a high calorie, high protein meal plan that promotes weight gain by distributing calorie intake throughout the day with the consumption for 4-5 meals, snacks, and/or supplements;Understanding of distribution of calorie intake throughout the day with the consumption of 4-5 meals/snacks    Tobacco Cessation Yes    Number of packs per day Daniel Fuentes is a recent tobacco user. Intervention for tobacco cessation was provided at the initial medical review. He was asked about readiness to quit and reported he quit a few weeks ago . Staff will continue to provide cessation encouragement and follow up with the patient throughout the program.    Intervention Assist the participant in steps to quit. Provide individualized education and counseling about committing to Tobacco Cessation, relapse prevention, and pharmacological support that can be provided by physician.;Comptroller, assist with locating and accessing local/national Quit Smoking programs, and support quit date choice.    Expected Outcomes Short Term: Will demonstrate readiness to quit, by selecting a quit date.;Short Term: Will quit all tobacco product use, adhering to prevention of relapse plan.;Long Term: Complete abstinence from all tobacco products for at least 12 months from quit date.    Diabetes Yes    Intervention Provide education about signs/symptoms and action to take for hypo/hyperglycemia.;Provide education about proper nutrition, including hydration, and aerobic/resistive exercise prescription along with prescribed medications to achieve blood glucose in normal ranges: Fasting glucose 65-99 mg/dL    Expected Outcomes Short Term: Participant verbalizes understanding of the signs/symptoms and immediate care of hyper/hypoglycemia, proper foot care and importance of medication, aerobic/resistive exercise and nutrition plan for blood glucose control.;Long Term: Attainment of HbA1C < 7%.    Hypertension Yes    Intervention Provide education on lifestyle modifcations including regular physical activity/exercise, weight management, moderate sodium restriction and increased consumption of fresh fruit, vegetables, and low fat dairy, alcohol moderation, and  smoking cessation.;Monitor prescription use compliance.    Expected Outcomes Short Term: Continued assessment and intervention until BP is < 140/38mm HG in hypertensive participants. < 130/64mm HG in hypertensive participants with diabetes, heart failure or chronic kidney disease.;Long Term: Maintenance of blood pressure at goal levels.    Lipids Yes    Intervention Provide education and support for participant on nutrition & aerobic/resistive exercise along with prescribed medications to achieve LDL '70mg'$ , HDL >$Remo'40mg'iibXh$ .    Expected Outcomes Short Term: Participant states understanding of desired cholesterol values and is compliant with medications  prescribed. Participant is following exercise prescription and nutrition guidelines.;Long Term: Cholesterol controlled with medications as prescribed, with individualized exercise RX and with personalized nutrition plan. Value goals: LDL < $Rem'70mg'wBxb$ , HDL > 40 mg.             Education:Diabetes - Individual verbal and written instruction to review signs/symptoms of diabetes, desired ranges of glucose level fasting, after meals and with exercise. Acknowledge that pre and post exercise glucose checks will be done for 3 sessions at entry of program. [place] from 03/04/2022 in [facility] Cardiac and Pulmonary Rehab  Date 03/04/22  Educator [facility]  Instruction Review Code 1- Verbalizes Understanding       Core Components/Risk Factors/Patient Goals Review:   Goals and Risk Factor Review     Row Name 03/04/22 1730 04/04/22 1739           Core Components/Risk Factors/Patient Goals Review   Personal Goals Review Tobacco Cessation Weight Management/Obesity;Diabetes;Tobacco Cessation      Review Hayven Fatima has recently quit tobacco use within the last 6 months. Intervention for relapse prevention was provided at the initial medical review. He was encouraged to continue to with tobacco cessation and was provided information on relapse prevention. Patient received information about combination therapy, tobacco cessation classes, quit line, and quit smoking apps in case of a relapse. Patient demonstrated understanding of this material.Staff will continue to provide encouragement and follow up with the patient throughout the program. Daniel wants to work on his weight. He wants to lose a few pounds. He states that he does not check his sugars at home.He has means to check his sugar at home. Informed him that it would be good to keep his sugars in check. He states that he has been smoking a few cigaretts a day. He states that he is not interested in quitting smoking all together. Daniel states that he  does not smoke enough to affect him.      Expected Outcomes Short: continue no to smoke Long: maintain tobacco free lifestyle. Short: decrease tobacco use and  monitor blood sugars. Long: maintain blood sugars and tobacco cessation independently.               Core Components/Risk Factors/Patient Goals at Discharge (Final Review):   Goals and Risk Factor Review - 04/04/22 1739       Core Components/Risk Factors/Patient Goals Review   Personal Goals Review Weight Management/Obesity;Diabetes;Tobacco Cessation    Review Daniel wants to work on his weight. He wants to lose a few pounds. He states that he does not check his sugars at home.He has means to check his sugar at home. Informed him that it would be good to keep his sugars in check. He states that he has been smoking a few cigaretts a day. He states that he is not interested in quitting smoking all together. Daniel states that he does not smoke enough to affect him.  Expected Outcomes Short: decrease tobacco use and  monitor blood sugars. Long: maintain blood sugars and tobacco cessation independently.             ITP Comments:  ITP Comments     Row Name 02/18/22 1507 03/04/22 1732 03/13/22 1356 03/13/22 1739 04/09/22 1153   ITP Comments Virtual orientation call completed today. he has an appointment on Date: 03/04/2022  for EP eval and gym Orientation.  Documentation of diagnosis can be found in  [facility]  Date: 02/08/2022  Yuniel is a recent tobacco user. Intervention for tobacco cessation was provided at the initial medical review. He was asked about readiness to quit and reported he quit a few weeks ago . Staff will continue to provide cessation encouragement and follow up with the patient throughout the program. Completed 6MWT and gym orientation. Initial ITP created and sent for review to Dr. Emily Filbert, Medical Director.Daniel Fuentes has recently quit tobacco use within the last 6 months. Intervention for relapse prevention was provided  at the initial medical review. He was encouraged to continue to with tobacco cessation and was provided information on relapse prevention. Patient received information about combination therapy, tobacco cessation classes, quit line, and quit smoking apps in case of a relapse. Patient demonstrated understanding of this material.Staff will continue to provide encouragement and follow up with the patient throughout the program.  Note was sent to Dr. Saunders Revel reguarding BP parameters during exercise for patient. 30 Day review completed. Medical Director ITP review done, changes made as directed, and signed approval by Medical Director. First full day of exercise!  Patient was oriented to gym and equipment including functions, settings, policies, and procedures.  Patient's individual exercise prescription and treatment plan were reviewed.  All starting workloads were established based on the results of the 6 minute walk test done at initial orientation visit.  The plan for exercise progression was also introduced and progression will be customized based on patient's performance and goals. Patient attendance has been inconsistent.    [place] Name 04/10/22 1036           ITP Comments 30 Day review completed. Medical Director ITP review done, changes made as directed, and signed approval by Medical Director.                Comments:

## 2022-04-11 DIAGNOSIS — Z955 Presence of coronary angioplasty implant and graft: Secondary | ICD-10-CM

## 2022-04-11 DIAGNOSIS — I214 Non-ST elevation (NSTEMI) myocardial infarction: Secondary | ICD-10-CM

## 2022-04-11 NOTE — Progress Notes (Signed)
Daily Session Note  Patient Details  Name: Daniel Fuentes MRN: [id number] Date of Birth: 08-18-1951 Referring Provider:   Flowsheet Row Cardiac Rehab from 03/04/2022 in [facility] Cardiac and Pulmonary Rehab  Referring Provider Dr. Saunders Revel       Encounter Date: 04/11/2022  Check In:  Session Check In - 04/11/22 1613       Check-In   Supervising physician immediately available to respond to emergencies See telemetry face sheet for immediately available ER MD    Location [facility]-Cardiac & Pulmonary Rehab    Staff Present Vida Rigger, RN, BSN;Joseph [place], RCP,RRT,BSRT;Melissa [place], RDN, LDN;Jessica [place], MA, RCEP, CCRP, CCET    Virtual Visit No    Medication changes reported     No    Fall or balance concerns reported    No    Tobacco Cessation No Change    Warm-up and Cool-down Performed on first and last piece of equipment    Resistance Training Performed Yes    VAD Patient? No    PAD/SET Patient? No      Pain Assessment   Currently in Pain? No/denies                Social History   Tobacco Use  Smoking Status Former   Packs/day: 0.20   Years: 50.00   Total pack years: 10.00   Types: Pipe, Cigarettes   Start date: 09/04/1966   Quit date: 02/08/2022   Years since quitting: 0.1  Smokeless Tobacco Never    Goals Met:  Independence with exercise equipment Exercise tolerated well No report of concerns or symptoms today Strength training completed today  Goals Unmet:  Not Applicable  Comments: Pt able to follow exercise prescription today without complaint.  Will continue to monitor for progression.   Dr. Emily Filbert is Medical Director for [place].  Dr. Ottie Glazier is Medical Director for [facility] Pulmonary Rehabilitation.

## 2022-04-12 ENCOUNTER — Other Ambulatory Visit: Payer: Medicare Other

## 2022-04-16 ENCOUNTER — Ambulatory Visit: Payer: Medicare Other | Admitting: Cardiovascular Disease

## 2022-04-17 ENCOUNTER — Encounter: Payer: Medicare Other | Admitting: *Deleted

## 2022-04-17 DIAGNOSIS — Z955 Presence of coronary angioplasty implant and graft: Secondary | ICD-10-CM

## 2022-04-17 DIAGNOSIS — I214 Non-ST elevation (NSTEMI) myocardial infarction: Secondary | ICD-10-CM | POA: Diagnosis not present

## 2022-04-17 NOTE — Progress Notes (Signed)
Daily Session Note  Patient Details  Name: Daniel Fuentes MRN: [id number] Date of Birth: Jun 26, 1951 Referring Provider:   Flowsheet Row Cardiac Rehab from 03/04/2022 in [facility] Cardiac and Pulmonary Rehab  Referring Provider Dr. Saunders Revel       Encounter Date: 04/17/2022  Check In:  Session Check In - 04/17/22 1737       Check-In   Supervising physician immediately available to respond to emergencies See telemetry face sheet for immediately available ER MD    Location [facility]-Cardiac & Pulmonary Rehab    Staff Present Nyoka Cowden, RN, BSN, MA;Susanne Bice, RN, BSN, CCRP;Melissa [place]ai[place], RDN, LDN    Virtual Visit No    Medication changes reported     No    Fall or balance concerns reported    No    Tobacco Cessation No Change    Warm-up and Cool-down Performed on first and last piece of equipment    Resistance Training Performed No    VAD Patient? No    PAD/SET Patient? No      Pain Assessment   Currently in Pain? No/denies                Social History   Tobacco Use  Smoking Status Former   Packs/day: 0.20   Years: 50.00   Total pack years: 10.00   Types: Pipe, Cigarettes   Start date: 09/04/1966   Quit date: 02/08/2022   Years since quitting: 0.1  Smokeless Tobacco Never    Goals Met:  Independence with exercise equipment Exercise tolerated well No report of concerns or symptoms today  Goals Unmet:  Not Applicable  Comments: Pt able to follow exercise prescription today without complaint.  Will continue to monitor for progression.    Dr. Emily Filbert is Medical Director for [place].  Dr. Ottie Glazier is Medical Director for [facility] Pulmonary Rehabilitation.

## 2022-04-18 ENCOUNTER — Encounter: Payer: Medicare Other | Admitting: *Deleted

## 2022-04-18 DIAGNOSIS — I214 Non-ST elevation (NSTEMI) myocardial infarction: Secondary | ICD-10-CM

## 2022-04-18 DIAGNOSIS — Z955 Presence of coronary angioplasty implant and graft: Secondary | ICD-10-CM

## 2022-04-18 NOTE — Progress Notes (Signed)
Daily Session Note  Patient Details  Name: Daniel Fuentes MRN: [id number] Date of Birth: [birth year] Referring Provider:   Flowsheet Row Cardiac Rehab from 03/04/2022 in [facility] Cardiac and Pulmonary Rehab  Referring Provider Dr. Saunders Revel       Encounter Date: 04/18/2022  Check In:  Session Check In - 04/18/22 1718       Check-In   Supervising physician immediately available to respond to emergencies See telemetry face sheet for immediately available ER MD    Location [facility]-Cardiac & Pulmonary Rehab    Staff Present Hope Budds, RDN, LDN;Axell Trigueros Sherryll Burger, RN BSN;Susanne Bice, RN, BSN, CCRP    Virtual Visit No    Medication changes reported     No    Fall or balance concerns reported    No    Tobacco Cessation No Change    Current number of cigarettes/nicotine per day     2    Warm-up and Cool-down Performed on first and last piece of equipment    Resistance Training Performed Yes    VAD Patient? No    PAD/SET Patient? No      Pain Assessment   Currently in Pain? No/denies                Social History   Tobacco Use  Smoking Status Former   Packs/day: 0.20   Years: 50.00   Total pack years: 10.00   Types: Pipe, Cigarettes   Start date: 09/04/1966   Quit date: 02/08/2022   Years since quitting: 0.1  Smokeless Tobacco Never    Goals Met:  Independence with exercise equipment Exercise tolerated well No report of concerns or symptoms today Strength training completed today  Goals Unmet:  Not Applicable  Comments: Pt able to follow exercise prescription today without complaint.  Will continue to monitor for progression.    Dr. Emily Filbert is Medical Director for [place].  Dr. Ottie Glazier is Medical Director for [facility] Pulmonary Rehabilitation.

## 2022-04-24 ENCOUNTER — Encounter: Payer: Medicare Other | Admitting: *Deleted

## 2022-04-24 DIAGNOSIS — I214 Non-ST elevation (NSTEMI) myocardial infarction: Secondary | ICD-10-CM | POA: Diagnosis not present

## 2022-04-24 DIAGNOSIS — Z955 Presence of coronary angioplasty implant and graft: Secondary | ICD-10-CM

## 2022-04-24 NOTE — Progress Notes (Signed)
Daily Session Note  Patient Details  Name: Daniel Fuentes MRN: [id number] Date of Birth: October 31, 1950 Referring Provider:   Flowsheet Row Cardiac Rehab from 03/04/2022 in [facility] Cardiac and Pulmonary Rehab  Referring Provider Dr. Saunders Revel       Encounter Date: 04/24/2022  Check In:  Session Check In - 04/24/22 1749       Check-In   Supervising physician immediately available to respond to emergencies See telemetry face sheet for immediately available ER MD    Location [facility]-Cardiac & Pulmonary Rehab    Staff Present Renita Papa, RN BSN;Mary Kellie Shropshire, RN, BSN, Tyna Jaksch, MS, ASCM CEP, Exercise Physiologist    Virtual Visit No    Medication changes reported     No    Fall or balance concerns reported    No    Tobacco Cessation No Change    Current number of cigarettes/nicotine per day     2    Warm-up and Cool-down Performed on first and last piece of equipment    Resistance Training Performed Yes    VAD Patient? No    PAD/SET Patient? No      Pain Assessment   Currently in Pain? No/denies                Social History   Tobacco Use  Smoking Status Former   Packs/day: 0.20   Years: 50.00   Total pack years: 10.00   Types: Pipe, Cigarettes   Start date: 09/04/1966   Quit date: 02/08/2022   Years since quitting: 0.2  Smokeless Tobacco Never    Goals Met:  Independence with exercise equipment Exercise tolerated well No report of concerns or symptoms today Strength training completed today  Goals Unmet:  Not Applicable  Comments: Pt able to follow exercise prescription today without complaint.  Will continue to monitor for progression. Reviewed home exercise with pt today.  Pt plans to attend [facility] for exercise.  Reviewed THR, pulse, RPE, sign and symptoms, pulse oximetery and when to call 911 or MD.  Also discussed weather considerations and indoor options.  Pt voiced understanding.    Dr. Emily Filbert is Medical Director for  [place].  Dr. Ottie Glazier is Medical Director for [facility] Pulmonary Rehabilitation.

## 2022-04-25 ENCOUNTER — Encounter: Payer: Medicare Other | Admitting: *Deleted

## 2022-04-25 DIAGNOSIS — I214 Non-ST elevation (NSTEMI) myocardial infarction: Secondary | ICD-10-CM

## 2022-04-25 DIAGNOSIS — Z955 Presence of coronary angioplasty implant and graft: Secondary | ICD-10-CM

## 2022-04-25 NOTE — Progress Notes (Signed)
Daily Session Note  Patient Details  Name: Daniel Fuentes MRN: [id number] Date of Birth: [birth year] Referring Provider:   Flowsheet Row Cardiac Rehab from 03/04/2022 in [facility] Cardiac and Pulmonary Rehab  Referring Provider Dr. Saunders Revel       Encounter Date: 04/25/2022  Check In:  Session Check In - 04/25/22 1716       Check-In   Supervising physician immediately available to respond to emergencies See telemetry face sheet for immediately available ER MD    Location [facility]-Cardiac & Pulmonary Rehab    Staff Present Hope Budds, RDN, LDN;Solita Macadam Sherryll Burger, RN Margurite Auerbach, MS, ASCM CEP, Exercise Physiologist    Virtual Visit No    Medication changes reported     No    Fall or balance concerns reported    No    Tobacco Cessation No Change    Current number of cigarettes/nicotine per day     2    Warm-up and Cool-down Performed on first and last piece of equipment    Resistance Training Performed Yes    VAD Patient? No    PAD/SET Patient? No      Pain Assessment   Currently in Pain? No/denies                Social History   Tobacco Use  Smoking Status Former   Packs/day: 0.20   Years: 50.00   Total pack years: 10.00   Types: Pipe, Cigarettes   Start date: 09/04/1966   Quit date: 02/08/2022   Years since quitting: 0.2  Smokeless Tobacco Never    Goals Met:  Independence with exercise equipment Exercise tolerated well No report of concerns or symptoms today Strength training completed today  Goals Unmet:  Not Applicable  Comments: Pt able to follow exercise prescription today without complaint.  Will continue to monitor for progression.    Dr. Emily Filbert is Medical Director for [place].  Dr. Ottie Glazier is Medical Director for [facility] Pulmonary Rehabilitation.

## 2022-04-29 ENCOUNTER — Encounter: Payer: Medicare Other | Admitting: *Deleted

## 2022-04-29 DIAGNOSIS — I214 Non-ST elevation (NSTEMI) myocardial infarction: Secondary | ICD-10-CM

## 2022-04-29 DIAGNOSIS — Z955 Presence of coronary angioplasty implant and graft: Secondary | ICD-10-CM

## 2022-04-29 NOTE — Progress Notes (Signed)
Daily Session Note  Patient Details  Name: Daniel Fuentes MRN: [id number] Date of Birth: [birth year] Referring Provider:   Flowsheet Row Cardiac Rehab from 03/04/2022 in [facility] Cardiac and Pulmonary Rehab  Referring Provider Dr. Saunders Revel       Encounter Date: 04/29/2022  Check In:  Session Check In - 04/29/22 1718       Check-In   Supervising physician immediately available to respond to emergencies See telemetry face sheet for immediately available ER MD    Location [facility]-Cardiac & Pulmonary Rehab    Staff Present Nyoka Cowden, RN, BSN, MA;Meredith Sherryll Burger, RN BSN;Melissa Tilford Pillar, RDN, LDN    Virtual Visit No    Medication changes reported     No    Fall or balance concerns reported    No    Tobacco Cessation No Change    Warm-up and Cool-down Performed on first and last piece of equipment    Resistance Training Performed Yes    VAD Patient? No    PAD/SET Patient? No      Pain Assessment   Currently in Pain? No/denies                Social History   Tobacco Use  Smoking Status Former   Packs/day: 0.20   Years: 50.00   Total pack years: 10.00   Types: Pipe, Cigarettes   Start date: 09/04/1966   Quit date: 02/08/2022   Years since quitting: 0.2  Smokeless Tobacco Never    Goals Met:  Independence with exercise equipment Exercise tolerated well No report of concerns or symptoms today  Goals Unmet:  Not Applicable  Comments: Pt able to follow exercise prescription today without complaint.  Will continue to monitor for progression.    Dr. Emily Filbert is Medical Director for [place].  Dr. Ottie Glazier is Medical Director for [facility] Pulmonary Rehabilitation.

## 2022-05-01 ENCOUNTER — Encounter: Payer: Medicare Other | Attending: Cardiovascular Disease | Admitting: *Deleted

## 2022-05-01 DIAGNOSIS — I214 Non-ST elevation (NSTEMI) myocardial infarction: Secondary | ICD-10-CM | POA: Insufficient documentation

## 2022-05-01 DIAGNOSIS — Z955 Presence of coronary angioplasty implant and graft: Secondary | ICD-10-CM | POA: Insufficient documentation

## 2022-05-01 NOTE — Progress Notes (Signed)
Daily Session Note  Patient Details  Name: Daniel Fuentes MRN: [id number] Date of Birth: 01-04-1951 Referring Provider:   Flowsheet Row Cardiac Rehab from 03/04/2022 in [facility] Cardiac and Pulmonary Rehab  Referring Provider Dr. Saunders Revel       Encounter Date: 05/01/2022  Check In:  Session Check In - 05/01/22 1813       Check-In   Supervising physician immediately available to respond to emergencies See telemetry face sheet for immediately available ER MD    Location [facility]Cardiac & Pulmonary Re[facility]    Staff Present Nyoka Cowden, RN, BSN, MA;Meredith Sherryll Burger, RN BSN    Virtual Visit No    Medication changes reported     No    Fall or balance concerns reported    No    Tobacco Cessation No Change    Warm-up and Cool-down Performed on first and last piece of equipment    Resistance Training Performed Yes    VAD Patient? No      Pain Assessment   Currently in Pain? No/denies                Social History   Tobacco Use  Smoking Status Former   Packs/day: 0.20   Years: 50.00   Total pack years: 10.00   Types: Pipe, Cigarettes   Start date: 09/04/1966   Quit date: 02/08/2022   Years since quitting: 0.2  Smokeless Tobacco Never    Goals Met:  Independence with exercise equipment Exercise tolerated well No report of concerns or symptoms today  Goals Unmet:  Not Applicable  Comments: Pt able to follow exercise prescription today without complaint.  Will continue to monitor for progression.    Dr. Emily Filbert is Medical Director for [place].  Dr. Ottie Glazier is Medical Director for [facility] Pul[facility] Rehabilitation.

## 2022-05-02 ENCOUNTER — Encounter: Payer: Medicare Other | Admitting: *Deleted

## 2022-05-02 DIAGNOSIS — I214 Non-ST elevation (NSTEMI) myocardial infarction: Secondary | ICD-10-CM | POA: Diagnosis not present

## 2022-05-02 DIAGNOSIS — Z955 Presence of coronary angioplasty implant and graft: Secondary | ICD-10-CM

## 2022-05-02 NOTE — Progress Notes (Signed)
Daily Session Note  Patient Details  Name: Daniel Fuentes MRN: [id number] Date of Birth: [birth year] Referring Provider:   Flowsheet Row Cardiac Rehab from 03/04/2022 in [facility] Cardiac and Pulmonary Rehab  Referring Provider Dr. Saunders Revel       Encounter Date: 05/02/2022  Check In:  Session Check In - 05/02/22 1743       Check-In   Supervising physician immediately available to respond to emergencies See telemetry face sheet for immediately available ER MD    Location [facility]-Cardiac & Pulmonary Rehab    Staff Present Heath Lark, RN, BSN, CCRP;Gurbani Figge Sherryll Burger, RN Margurite Auerbach, MS, ASCM CEP, Exercise Physiologist    Virtual Visit No    Medication changes reported     No    Fall or balance concerns reported    No    Tobacco Cessation No Change    Current number of cigarettes/nicotine per day     2    Warm-up and Cool-down Performed on first and last piece of equipment    Resistance Training Performed Yes    VAD Patient? No    PAD/SET Patient? No      Pain Assessment   Currently in Pain? No/denies                Social History   Tobacco Use  Smoking Status Former   Packs/day: 0.20   Years: 50.00   Total pack years: 10.00   Types: Pipe, Cigarettes   Start date: 09/04/1966   Quit date: 02/08/2022   Years since quitting: 0.2  Smokeless Tobacco Never    Goals Met:  Independence with exercise equipment Exercise tolerated well No report of concerns or symptoms today Strength training completed today  Goals Unmet:  Not Applicable  Comments: Pt able to follow exercise prescription today without complaint.  Will continue to monitor for progression.    Dr. Emily Filbert is Medical Director for [place].  Dr. Ottie Glazier is Medical Director for [facility] Pul[facility] Rehabilitation.

## 2022-05-06 ENCOUNTER — Encounter: Payer: Medicare Other | Admitting: *Deleted

## 2022-05-06 DIAGNOSIS — I214 Non-ST elevation (NSTEMI) myocardial infarction: Secondary | ICD-10-CM | POA: Diagnosis not present

## 2022-05-06 DIAGNOSIS — Z955 Presence of coronary angioplasty implant and graft: Secondary | ICD-10-CM

## 2022-05-06 NOTE — Progress Notes (Signed)
Daily Session Note  Patient Details  Name: Daniel Fuentes MRN: [id number] Date of Birth: Aug 25, 1951 Referring Provider:   Flowsheet Row Cardiac Rehab from 03/04/2022 in [facility] Cardiac and Pulmonary Rehab  Referring Provider Dr. Saunders Revel       Encounter Date: 05/06/2022  Check In:  Session Check In - 05/06/22 1752       Check-In   Supervising physician immediately available to respond to emergencies See telemetry face sheet for immediately available ER MD    Location [facility]-Cardiac & Pulmonary Rehab    Staff Present Nyoka Cowden, RN, BSN, Ardeth Sportsman, RDN, LDN    Virtual Visit No    Medication changes reported     No    Fall or balance concerns reported    No    Current number of cigarettes/nicotine per day     3    Warm-up and Cool-down Performed on first and last piece of equipment    Resistance Training Performed Yes    VAD Patient? No    PAD/SET Patient? No      Pain Assessment   Currently in Pain? No/denies                Social History   Tobacco Use  Smoking Status Former   Packs/day: 0.20   Years: 50.00   Total pack years: 10.00   Types: Pipe, Cigarettes   Start date: 09/04/1966   Quit date: 02/08/2022   Years since quitting: 0.2  Smokeless Tobacco Never    Goals Met:  Independence with exercise equipment Exercise tolerated well No report of concerns or symptoms today  Goals Unmet:  Not Applicable  Comments: Pt able to follow exercise prescription today without complaint.  Will continue to monitor for progression.    Dr. Emily Filbert is Medical Director for [place].  Dr. Ottie Glazier is Medical Director for [facility].

## 2022-05-08 ENCOUNTER — Encounter: Payer: Self-pay | Admitting: *Deleted

## 2022-05-08 ENCOUNTER — Encounter: Payer: Medicare Other | Admitting: *Deleted

## 2022-05-08 DIAGNOSIS — Z955 Presence of coronary angioplasty implant and graft: Secondary | ICD-10-CM

## 2022-05-08 DIAGNOSIS — I214 Non-ST elevation (NSTEMI) myocardial infarction: Secondary | ICD-10-CM | POA: Diagnosis not present

## 2022-05-08 NOTE — Progress Notes (Signed)
Cardiac Individual Treatment Plan  Patient Details  Name: Eliberto Sole MRN: [id number] Date of Birth: [birth year] Referring Provider:   Flowsheet Row Cardiac Rehab from 03/04/2022 in [facility] Cardiac and Pulmonary Rehab  Referring Provider Dr. Saunders Revel       Initial Encounter Date:  Flowsheet Row Cardiac Rehab from 03/04/2022 in [facility] Cardiac and Pulmonary Rehab  Date 03/04/22       Visit Diagnosis: NSTEMI (non-ST elevated myocardial infarction) [facility])  Status post coronary artery stent placement  Patient's Home Medications on Admission:  Current Outpatient Medications:    amLODipine (NORVASC) 10 MG tablet, Take 1 tablet (10 mg total) by mouth daily., Disp: 90 tablet, Rfl: 3   aspirin EC 81 MG tablet, Take 1 tablet (81 mg total) by mouth daily., Disp: 30 tablet, Rfl: 0   carvedilol (COREG) 6.25 MG tablet, Take 1 tablet (6.25 mg total) by mouth 2 (two) times daily with a meal., Disp: 180 tablet, Rfl: 3   Cholecalciferol (VITAMIN D3) 2000 units capsule, Take 2,000 Units by mouth daily., Disp: , Rfl:    Evolocumab (REPATHA SURECLICK) 992 MG/ML SOAJ, Inject 140 mg into the skin every 14 (fourteen) days., Disp: 2 mL, Rfl: 11   losartan (COZAAR) 25 MG tablet, Take 1 tablet (25 mg total) by mouth daily., Disp: 90 tablet, Rfl: 3   metFORMIN (GLUCOPHAGE) 1000 MG tablet, Take 1 tablet (1,000 mg total) by mouth daily. Start tomorrow, Disp: , Rfl:    Multiple Vitamins-Minerals (MULTIVITAMIN WITH MINERALS) tablet, Take 1 tablet by mouth daily., Disp: , Rfl:    nitroGLYCERIN (NITROSTAT) 0.4 MG SL tablet, Place 1 tablet (0.4 mg total) under the tongue every 5 (five) minutes as needed for chest pain., Disp: 30 tablet, Rfl: 0   solifenacin (VESICARE) 10 MG tablet, Take 10 mg by mouth daily., Disp: , Rfl:    tamsulosin (FLOMAX) 0.4 MG CAPS capsule, Take 0.4 mg by mouth daily., Disp: , Rfl:    vitamin C (ASCORBIC ACID) 500 MG tablet, Take 500 mg by mouth., Disp: , Rfl:   Past Medical History: Past Medical  History:  Diagnosis Date   CAD (coronary artery disease)    a. 01/2022 NSTEMI/PCI: LM nl, LAD 81m, D1 50, D3 90, LCX 73m, 95/60d, OM2 80, RCA 100p/m, 52m, 70d (3.5x12, 3.0x38, & 3.5x8 Onyx Frontier DES), RPAV1 90 (2.0x12 Onyx Frontier DES), RPAV2 99, RPL1 50, RPL2 99. EF 50-55%; b. 01/2022 Staged PCI: LCX (3.5x18 & 3.0x38 Onyx Frontier DES), OM2 (3.0x22 Onyx Frontier DES).   CKD (chronic kidney disease), stage II    Diabetes mellitus without complication ([place])    Hyperlipidemia    Hypertension    Ischemic cardiomyopathy    a. 01/2022 Echo: EF 50-55%, mod LVH, GRI DD, mild basal-mid inf/infsept HK. Mildly reduced RV fxn, Mildly dil RA. Mild MR. MIld-mod TR. AoV sclerosis w/o stenosis.    Tobacco Use: Social History   Tobacco Use  Smoking Status Former   Packs/day: 0.20   Years: 50.00   Total pack years: 10.00   Types: Pipe, Cigarettes   Start date: 09/04/1966   Quit date: 02/08/2022   Years since quitting: 0.2  Smokeless Tobacco Never    Labs: Review Flowsheet  More data exists      Latest Ref Rng & Units 05/27/2017 12/26/2017 05/14/2018 02/08/2022 03/13/2022  Labs for ITP Cardiac and Pulmonary Rehab  Cholestrol 0 - 200 mg/dL 187  195  - 181  142   LDL (calc) 0 - 99 mg/dL 125  130  -  110  82   HDL-C >40 mg/dL 40  37  - 48  49   Trlycerides <150 mg/dL 112  158  - 114  53   Hemoglobin A1c 4.8 - 5.6 % - 8.4  7.1  5.9  -     Exercise Target Goals: Exercise Program Goal: Individual exercise prescription set using results from initial 6 min walk test and THRR while considering  patient's activity barriers and safety.   Exercise Prescription Goal: Initial exercise prescription builds to 30-45 minutes a day of aerobic activity, 2-3 days per week.  Home exercise guidelines will be given to patient during program as part of exercise prescription that the participant will acknowledge.   Education: Aerobic Exercise: - Group verbal and visual presentation on the components of exercise  prescription. Introduces F.I.T.T principle from ACSM for exercise prescriptions.  Reviews F.I.T.T. principles of aerobic exercise including progression. Written material given at graduation.   Education: Resistance Exercise: - Group verbal and visual presentation on the components of exercise prescription. Introduces F.I.T.T principle from ACSM for exercise prescriptions  Reviews F.I.T.T. principles of resistance exercise including progression. Written material given at graduation.    Education: Exercise & Equipment Safety: - Individual verbal instruction and demonstration of equipment use and safety with use of the equipment. Flowsheet Row Cardiac Rehab from 03/04/2022 in [facility] Cardiac and Pulmonary Rehab  Date 03/04/22  Educator [facility]  Instruction Review Code 1- Verbalizes Understanding       Education: Exercise Physiology & General Exercise Guidelines: - Group verbal and written instruction with models to review the exercise physiology of the cardiovascular system and associated critical values. Provides general exercise guidelines with specific guidelines to those with heart or lung disease.  Flowsheet Row Cardiac Rehab from 03/04/2022 in [facility] Cardiac and Pulmonary Rehab  Education need identified 03/04/22       Education: Flexibility, Balance, Mind/Body Relaxation: - Group verbal and visual presentation with interactive activity on the components of exercise prescription. Introduces F.I.T.T principle from ACSM for exercise prescriptions. Reviews F.I.T.T. principles of flexibility and balance exercise training including progression. Also discusses the mind body connection.  Reviews various relaxation techniques to help reduce and manage stress (i.e. Deep breathing, progressive muscle relaxation, and visualization). Balance handout provided to take home. Written material given at graduation.   Activity Barriers & Risk Stratification:  Activity Barriers & Cardiac Risk Stratification -  02/18/22 1451       Activity Barriers & Cardiac Risk Stratification   Activity Barriers None    Cardiac Risk Stratification Moderate             6 Minute Walk:  6 Minute Walk     Row Name 03/04/22 1721         6 Minute Walk   Phase Initial     Distance 1215 feet     Walk Time 6 minutes     # of Rest Breaks 0     MPH 2.3     METS 3.45     RPE 11     Perceived Dyspnea  0     VO2 Peak 12.06     Symptoms Yes (comment)     Comments High BP response to 6 MWT (220/100). Patient forgot to take meds today.     Resting HR 63 bpm     Resting BP 150/80     Resting Oxygen Saturation  99 %     Exercise Oxygen Saturation  during 6 min walk 100 %  Max Ex. HR 108 bpm     Max Ex. BP 220/100     2 Minute Post BP 188/80  After further rest patient BP came down to 160/80              Oxygen Initial Assessment:   Oxygen Re-Evaluation:   Oxygen Discharge (Final Oxygen Re-Evaluation):   Initial Exercise Prescription:  Initial Exercise Prescription - 03/04/22 1700       Date of Initial Exercise RX and Referring Provider   Date 03/04/22    Referring Provider Dr. Saunders Revel      Oxygen   Maintain Oxygen Saturation 88% or higher      Treadmill   MPH 108    Grade 0    Minutes 15    METs 2.38      Recumbant Bike   Level 1    RPM 60    Minutes 15    METs 3.45      NuStep   Level 2    SPM 80    Minutes 15    METs 3.45      T5 Nustep   Level 1    SPM 80    Minutes 15    METs 3.45      Track   Laps 25    Minutes 15    METs 2.36      Prescription Details   Frequency (times per week) 2    Duration Progress to 30 minutes of continuous aerobic without signs/symptoms of physical distress      Intensity   THRR 40-80% of Max Heartrate 96-130    Ratings of Perceived Exertion 11-13    Perceived Dyspnea 0-4      Progression   Progression Continue to progress workloads to maintain intensity without signs/symptoms of physical distress.      Resistance Training    Training Prescription No   Patient omitted weights becasue of BP response to walking. Patient forgot take BP meds today. Re-evaluate on first day when patient has taken meds to monotor BP reponse to exercise.            Perform Capillary Blood Glucose checks as needed.  Exercise Prescription Changes:   Exercise Prescription Changes     Row Name 03/04/22 1700 03/20/22 0900 04/01/22 1500 04/15/22 1500 04/24/22 1700     Response to Exercise   Blood Pressure (Admit) -- 154/78 122/60 128/74 --   Blood Pressure (Exercise) -- 160/78 156/76 162/76 --   Blood Pressure (Exit) -- 108/60 118/60 122/72 --   Heart Rate (Admit) 63 bpm 72 bpm 68 bpm 95 bpm --   Heart Rate (Exercise) 108 bpm 103 bpm 115 bpm 110 bpm --   Heart Rate (Exit) 73 bpm 71 bpm 75 bpm 81 bpm --   Oxygen Saturation (Admit) 99 % -- -- -- --   Oxygen Saturation (Exercise) 100 % -- -- -- --   Oxygen Saturation (Exit) 100 % -- -- -- --   Rating of Perceived Exertion (Exercise) _0 --   Perceived Dyspnea (Exercise) 0 -- -- -- --   Symptoms High BP resonse to exercise none none none --   Comments closely monitor BP repsonse to exercise when patient has taken meds 2nd full day of exercise -- -- --   Duration -- Progress to 30 minutes of  aerobic without signs/symptoms of physical distress Progress to 30 minutes of  aerobic without signs/symptoms of physical distress Progress to 30 minutes of  aerobic without signs/symptoms of physical distress --   Intensity -- THRR unchanged THRR unchanged THRR unchanged --     Progression   Progression -- Continue to progress workloads to maintain intensity without signs/symptoms of physical distress. Continue to progress workloads to maintain intensity without signs/symptoms of physical distress. Continue to progress workloads to maintain intensity without signs/symptoms of physical distress. --   Average METs -- 2.4 2.47 2.87 --     Resistance Training   Training Prescription --  Yes  BP has been maintained; patient taking meds Yes Yes --   Weight -- 4 lb 4 lb 6 lb --   Reps -- 10-15 10-15 10-15 --     Interval Training   Interval Training -- No No No --     Treadmill   MPH -- -- 1.8 -- --   Grade -- -- 0 -- --   Minutes -- -- 15 -- --   METs -- -- 1.77 -- --     NuStep   Level -- $Remove'2 2 3 'ufGRKbt$ --   Minutes -- $RemoveBe'15 15 15 'LgXPualZW$ --   METs -- 2.5 2.5 3.9 --     REL-XR   Level -- -- 2 -- --   Minutes -- -- 15 -- --   METs -- -- 3.1 -- --     Track   Laps -- 25 28 32 --   Minutes -- $RemoveBe'15 15 15 'zIUFtQOAq$ --   METs -- 2.36 2.52 2.74 --     Home Exercise Plan   Plans to continue exercise at -- -- -- -- [employer] (comment)  [facility]   Frequency -- -- -- -- Add 2 additional days to program exercise sessions.   Initial Home Exercises Provided -- -- -- -- 04/24/22     Oxygen   Maintain Oxygen Saturation -- 88% or higher 88% or higher 88% or higher --    Row Name 05/01/22 1200             Response to Exercise   Blood Pressure (Admit) 112/76       Blood Pressure (Exercise) 128/74       Blood Pressure (Exit) 118/62       Heart Rate (Admit) 77 bpm       Heart Rate (Exercise) 113 bpm       Heart Rate (Exit) 87 bpm       Rating of Perceived Exertion (Exercise) 14       Symptoms none       Duration Progress to 30 minutes of  aerobic without signs/symptoms of physical distress       Intensity THRR unchanged         Progression   Progression Continue to progress workloads to maintain intensity without signs/symptoms of physical distress.       Average METs 3.3         Resistance Training   Training Prescription Yes       Weight 7 lb       Reps 10-15         Interval Training   Interval Training No         NuStep   Level 3       Minutes 15       METs 4.1         REL-XR   Level 1       Minutes 15       METs 2.2  Track   Laps 66       Minutes 15       METs 30         Home Exercise Plan   Plans to continue exercise at [employer] (comment)  [facility]       Frequency Add 2 additional days to program exercise sessions.       Initial Home Exercises Provided 04/24/22         Oxygen   Maintain Oxygen Saturation 88% or higher                Exercise Comments:   Exercise Comments     Row Name 03/13/22 1739           Exercise Comments First full day of exercise!  Patient was oriented to gym and equipment including functions, settings, policies, and procedures.  Patient's individual exercise prescription and treatment plan were reviewed.  All starting workloads were established based on the results of the 6 minute walk test done at initial orientation visit.  The plan for exercise progression was also introduced and progression will be customized based on patient's performance and goals.                Exercise Goals and Review:   Exercise Goals     Row Name 03/04/22 1729             Exercise Goals   Increase Physical Activity Yes       Intervention Provide advice, education, support and counseling about physical activity/exercise needs.;Develop an individualized exercise prescription for aerobic and resistive training based on initial evaluation findings, risk stratification, comorbidities and participant's personal goals.       Expected Outcomes Short Term: Attend rehab on a regular basis to increase amount of physical activity.;Long Term: Add in home exercise to make exercise part of routine and to increase amount of physical activity.;Long Term: Exercising regularly at least 3-5 days a week.       Increase Strength and Stamina Yes       Intervention Provide advice, education, support and counseling about physical activity/exercise needs.;Develop an individualized exercise prescription for aerobic and resistive training based on initial evaluation findings, risk stratification, comorbidities and participant's personal goals.       Expected Outcomes Short Term: Increase  workloads from initial exercise prescription for resistance, speed, and METs.;Short Term: Perform resistance training exercises routinely during rehab and add in resistance training at home;Long Term: Improve cardiorespiratory fitness, muscular endurance and strength as measured by increased METs and functional capacity (6MWT)       Able to understand and use rate of perceived exertion (RPE) scale Yes       Intervention Provide education and explanation on how to use RPE scale       Expected Outcomes Short Term: Able to use RPE daily in rehab to express subjective intensity level;Long Term:  Able to use RPE to guide intensity level when exercising independently       Able to understand and use Dyspnea scale Yes       Intervention Provide education and explanation on how to use Dyspnea scale       Expected Outcomes Short Term: Able to use Dyspnea scale daily in rehab to express subjective sense of shortness of breath during exertion;Long Term: Able to use Dyspnea scale to guide intensity level when exercising independently       Knowledge and understanding of Target Heart Rate Range (THRR)  Yes       Intervention Provide education and explanation of THRR including how the numbers were predicted and where they are located for reference       Expected Outcomes Short Term: Able to state/look up THRR;Long Term: Able to use THRR to govern intensity when exercising independently;Short Term: Able to use daily as guideline for intensity in rehab       Able to check pulse independently Yes       Intervention Provide education and demonstration on how to check pulse in carotid and radial arteries.;Review the importance of being able to check your own pulse for safety during independent exercise       Expected Outcomes Short Term: Able to explain why pulse checking is important during independent exercise;Long Term: Able to check pulse independently and accurately       Understanding of Exercise Prescription Yes        Intervention Provide education, explanation, and written materials on patient's individual exercise prescription       Expected Outcomes Short Term: Able to explain program exercise prescription;Long Term: Able to explain home exercise prescription to exercise independently                Exercise Goals Re-Evaluation :  Exercise Goals Re-Evaluation     Row Name 03/13/22 1739 03/20/22 0948 04/01/22 1533 04/15/22 1542 04/24/22 1753     Exercise Goal Re-Evaluation   Exercise Goals Review Able to understand and use rate of perceived exertion (RPE) scale;Able to understand and use Dyspnea scale;Knowledge and understanding of Target Heart Rate Range (THRR);Understanding of Exercise Prescription Understanding of Exercise Prescription;Increase Physical Activity;Increase Strength and Stamina Understanding of Exercise Prescription;Increase Physical Activity;Increase Strength and Stamina Understanding of Exercise Prescription;Increase Physical Activity;Increase Strength and Stamina Increase Physical Activity;Able to understand and use rate of perceived exertion (RPE) scale;Knowledge and understanding of Target Heart Rate Range (THRR);Understanding of Exercise Prescription;Increase Strength and Stamina;Able to check pulse independently   Comments Reviewed RPE and dyspnea scales, THR and program prescription with pt today.  Pt voiced understanding and was given a copy of goals to take home. Ron is doing well in rehab for the first couple of sessions that he has been here. He had a hypertensive response to exercise during his 6MWT and has had better BP responses during his rehab sessions as he has been taking his medications. He was able to follow his exercise prescription and will continue to monitor while he furthers in the program. Ron continues to do well in rehab. He increased up to 28 laps on the track. He improved to 2.47 average METs as well. He has also tolerated using 4 lb hand weights for resistance  training. We will continue to monitor his progress in the program. Ron continues to do well in rehab. He recently improved to 2.87 average METs. He also increased his laps on the track to 32 laps. He improved to level 3 on the T4 as well. He moved up to 6 lb hand weights for resistance training also. We will continue to monitor his progress in the program. Reviewed home exercise with pt today.  Pt plans to attend [facility] for exercise.  Reviewed THR, pulse, RPE, sign and symptoms, pulse oximetery and when to call 911 or MD.  Also discussed weather considerations and indoor options.  Pt voiced understanding.   Expected Outcomes Short: Use RPE daily to regulate intensity. Long: Follow program prescription in THR. Short: Follow current exercise prescription and continue  to stay compliant with BP medications Long: Increase overall strength and stamina Short: Continue to increase laps on the track. Long: Continue to improve overall MET levels. Short: Continue to increase laps on the track. Long: Continue to improve strength and stamina. Short: start with one extra day of exercise. Long: independently exercise at home with appropriate prescription    Row Name 05/01/22 1211             Exercise Goal Re-Evaluation   Exercise Goals Review Increase Physical Activity;Increase Strength and Stamina;Understanding of Exercise Prescription       Comments Ron is doing well in rehab. He recently improved his overall average MET level to 3.3 METs. He also got up to 66 laps on the track when walking for the full 30 minutes. He improved to 7 lb hand weights for resistance training as well. We will continue to monitor his progress in the program.       Expected Outcomes Short: try level 2 on the XR. Long: Continue to increase strength and stamina.                Discharge Exercise Prescription (Final Exercise Prescription Changes):  Exercise Prescription Changes - 05/01/22 1200       Response to  Exercise   Blood Pressure (Admit) 112/76    Blood Pressure (Exercise) 128/74    Blood Pressure (Exit) 118/62    Heart Rate (Admit) 77 bpm    Heart Rate (Exercise) 113 bpm    Heart Rate (Exit) 87 bpm    Rating of Perceived Exertion (Exercise) 14    Symptoms none    Duration Progress to 30 minutes of  aerobic without signs/symptoms of physical distress    Intensity THRR unchanged      Progression   Progression Continue to progress workloads to maintain intensity without signs/symptoms of physical distress.    Average METs 3.3      Resistance Training   Training Prescription Yes    Weight 7 lb    Reps 10-15      Interval Training   Interval Training No      NuStep   Level 3    Minutes 15    METs 4.1      REL-XR   Level 1    Minutes 15    METs 2.2      Track   Laps 66    Minutes 15    METs 30      Home Exercise Plan   Plans to continue exercise at [employer] (comment)   [facility]   Frequency Add 2 additional days to program exercise sessions.    Initial Home Exercises Provided 04/24/22      Oxygen   Maintain Oxygen Saturation 88% or higher             Nutrition:  Target Goals: Understanding of nutrition guidelines, daily intake of sodium '1500mg'$ , cholesterol '200mg'$ , calories 30% from fat and 7% or less from saturated fats, daily to have 5 or more servings of fruits and vegetables.  Education: All About Nutrition: -Group instruction provided by verbal, written material, interactive activities, discussions, models, and posters to present general guidelines for heart healthy nutrition including fat, fiber, MyPlate, the role of sodium in heart healthy nutrition, utilization of the nutrition label, and utilization of this knowledge for meal planning. Follow up email sent as well. Written material given at graduation. Flowsheet Row Cardiac Rehab from 03/04/2022 in [facility] Cardiac and Pulmonary Rehab  Education need identified 03/04/22        Biometrics:  Pre Biometrics - 03/04/22 1729       Pre Biometrics   Height $Remov'5\' 8"'DeHGVC$  (1.727 m)    Weight 175 lb 1.6 oz (79.4 kg)    BMI (Calculated) 26.63    Single Leg Stand 24 seconds              Nutrition Therapy Plan and Nutrition Goals:  Nutrition Therapy & Goals - 03/04/22 1744       Intervention Plan   Intervention Prescribe, educate and counsel regarding individualized specific dietary modifications aiming towards targeted core components such as weight, hypertension, lipid management, diabetes, heart failure and other comorbidities.    Expected Outcomes Short Term Goal: Understand basic principles of dietary content, such as calories, fat, sodium, cholesterol and nutrients.;Short Term Goal: A plan has been developed with personal nutrition goals set during dietitian appointment.;Long Term Goal: Adherence to prescribed nutrition plan.             Nutrition Assessments:  MEDIFICTS Score Key: ?70 Need to make dietary changes  40-70 Heart Healthy Diet ? 40 Therapeutic Level Cholesterol Diet  Flowsheet Row Cardiac Rehab from 03/04/2022 in [facility] Cardiac and Pulmonary Rehab  Picture Your Plate Total Score on Admission 58      Picture Your Plate Scores: <23 Unhealthy dietary pattern with much room for improvement. 41-50 Dietary pattern unlikely to meet recommendations for good health and room for improvement. 51-60 More healthful dietary pattern, with some room for improvement.  >60 Healthy dietary pattern, although there may be some specific behaviors that could be improved.    Nutrition Goals Re-Evaluation:  Nutrition Goals Re-Evaluation     Jay Name 04/04/22 1737 04/24/22 1744           Goals   Current Weight 173 lb (78.5 kg) --      Nutrition Goal Work on foods that he needs to eat. --      Comment He would like to meet the dietitian. Schedule meeting with the dietitian      Expected Outcome Short: meet with dietitian. Long: maintain a diet that he  can adhere to. Short: meet with the dietitian; Long: maintain a heart healthy diet.               Nutrition Goals Discharge (Final Nutrition Goals Re-Evaluation):  Nutrition Goals Re-Evaluation - 04/24/22 1744       Goals   Comment Schedule meeting with the dietitian    Expected Outcome Short: meet with the dietitian; Long: maintain a heart healthy diet.             Psychosocial: Target Goals: Acknowledge presence or absence of significant depression and/or stress, maximize coping skills, provide positive support system. Participant is able to verbalize types and ability to use techniques and skills needed for reducing stress and depression.   Education: Stress, Anxiety, and Depression - Group verbal and visual presentation to define topics covered.  Reviews how body is impacted by stress, anxiety, and depression.  Also discusses healthy ways to reduce stress and to treat/manage anxiety and depression.  Written material given at graduation.   Education: Sleep Hygiene -Provides group verbal and written instruction about how sleep can affect your health.  Define sleep hygiene, discuss sleep cycles and impact of sleep habits. Review good sleep hygiene tips.    Initial Review & Psychosocial Screening:  Initial Psych Review & Screening - 02/18/22 1452  Initial Review   Current issues with None Identified      Family Dynamics   Good Support System? Yes   wife, 20 years,     Barriers   Psychosocial barriers to participate in program There are no identifiable barriers or psychosocial needs.      Screening Interventions   Interventions Encouraged to exercise;To provide support and resources with identified psychosocial needs;Provide feedback about the scores to participant    Expected Outcomes Short Term goal: Utilizing psychosocial counselor, staff and physician to assist with identification of specific Stressors or current issues interfering with healing process. Setting  desired goal for each stressor or current issue identified.;Long Term Goal: Stressors or current issues are controlled or eliminated.;Short Term goal: Identification and review with participant of any Quality of Life or Depression concerns found by scoring the questionnaire.;Long Term goal: The participant improves quality of Life and PHQ9 Scores as seen by post scores and/or verbalization of changes             Quality of Life Scores:   Quality of Life - 03/04/22 1737       Quality of Life   Select Quality of Life      Quality of Life Scores   Health/Function Pre 19.07 %    Socioeconomic Pre 21.88 %    Psych/Spiritual Pre 26.86 %    Family Pre 26.86 %    GLOBAL Post 22.06 %            Scores of 19 and below usually indicate a poorer quality of life in these areas.  A difference of  2-3 points is a clinically meaningful difference.  A difference of 2-3 points in the total score of the Quality of Life Index has been associated with significant improvement in overall quality of life, self-image, physical symptoms, and general health in studies assessing change in quality of life.  PHQ-9: Review Flowsheet  More data may exist      03/04/2022 05/14/2018 01/06/2017 09/04/2016 08/21/2015  Depression screen PHQ 2/9  Decreased Interest 0 0 0 0 0  Down, Depressed, Hopeless 0 0 0 0 0  PHQ - 2 Score 0 0 0 0 0  Altered sleeping 1 0 - - -  Tired, decreased energy 1 0 - - -  Change in appetite 0 0 - - -  Feeling bad or failure about yourself  0 0 - - -  Trouble concentrating 1 0 - - -  Moving slowly or fidgety/restless 0 0 - - -  Suicidal thoughts 0 0 - - -  PHQ-9 Score 3 0 - - -  Difficult doing work/chores Somewhat difficult Not difficult at all - - -   Interpretation of Total Score  Total Score Depression Severity:  1-4 = Minimal depression, 5-9 = Mild depression, 10-14 = Moderate depression, 15-19 = Moderately severe depression, 20-27 = Severe depression   Psychosocial Evaluation  and Intervention:  Psychosocial Evaluation - 02/18/22 1501       Psychosocial Evaluation & Interventions   Interventions Encouraged to exercise with the program and follow exercise prescription    Comments Ron has no barriers to attending the program. He lives with his wife and she is his support. He wants to start driving amd I did advise him to check with his physician.  Ron has  recently quit tobaccouse. He wants to saty quit.   He is ready to get started with the program.    Expected Outcomes STTG Ron attends all  scheduled session, he continue to remain without tobacco use. LTG Ron continues with his exercise progression and remain tobacco free.    Continue Psychosocial Services  Follow up required by staff             Psychosocial Re-Evaluation:  Psychosocial Re-Evaluation     [place] Name 04/04/22 4580 04/24/22 1738           Psychosocial Re-Evaluation   Current issues with None Identified --      Comments Patient reports no issues with their current mental states, sleep, stress, depression or anxiety. Will follow up with patient in a few weeks for any changes. Patient commented that his sleep is disturbed 4-5 times a night due to needing to use the bathroom and he usually has a hard time falling back asleep after 2 am. He states this is usual for him and he thinks the early wake up time is because he goes to sleep right after dinner time. He is content with his sleep habits for now and doesn't see a need to address them right now. He also mentioned he is wanting to work on [employer] a heart healthy lifestyle after graduation with exercise and diet but has a hard time with being consistent. He feels like this program is helping getting him into a routine.      Expected Outcomes Short: Continue to exercise regularly to support mental health and notify staff of any changes. Long: maintain mental health and well being through teaching of rehab or prescribed medications independently.  Short: continue coming to the program to help develop a routine and stay encouraged. Long: maintain a heart healthy lifestyle independently      Interventions Encouraged to attend Cardiac Rehabilitation for the exercise Encouraged to attend Cardiac Rehabilitation for the exercise      Continue Psychosocial Services  Follow up required by staff Follow up required by staff               Psychosocial Discharge (Final Psychosocial Re-Evaluation):  Psychosocial Re-Evaluation - 04/24/22 1738       Psychosocial Re-Evaluation   Comments Patient commented that his sleep is disturbed 4-5 times a night due to needing to use the bathroom and he usually has a hard time falling back asleep after 2 am. He states this is usual for him and he thinks the early wake up time is because he goes to sleep right after dinner time. He is content with his sleep habits for now and doesn't see a need to address them right now. He also mentioned he is wanting to work on [employer] a heart healthy lifestyle after graduation with exercise and diet but has a hard time with being consistent. He feels like this program is helping getting him into a routine.    Expected Outcomes Short: continue coming to the program to help develop a routine and stay encouraged. Long: maintain a heart healthy lifestyle independently    Interventions Encouraged to attend Cardiac Rehabilitation for the exercise    Continue Psychosocial Services  Follow up required by staff             Vocational Rehabilitation: Provide vocational rehab assistance to qualifying candidates.   Vocational Rehab Evaluation & Intervention:   Education: Education Goals: Education classes will be provided on a variety of topics geared toward better understanding of heart health and risk factor modification. Participant will state understanding/return demonstration of topics presented as noted by education test scores.  Learning  Barriers/Preferences:  General Cardiac Education Topics:  AED/CPR: - Group verbal and written instruction with the use of models to demonstrate the basic use of the AED with the basic ABC's of resuscitation.   Anatomy and Cardiac Procedures: - Group verbal and visual presentation and models provide information about basic cardiac anatomy and function. Reviews the testing methods done to diagnose heart disease and the outcomes of the test results. Describes the treatment choices: Medical Management, Angioplasty, or Coronary Bypass Surgery for treating various heart conditions including Myocardial Infarction, Angina, Valve Disease, and Cardiac Arrhythmias.  Written material given at graduation. Flowsheet Row Cardiac Rehab from 03/04/2022 in [facility] Cardiac and Pulmonary Rehab  Education need identified 03/04/22       Medication Safety: - Group verbal and visual instruction to review commonly prescribed medications for heart and lung disease. Reviews the medication, class of the drug, and side effects. Includes the steps to properly store meds and maintain the prescription regimen.  Written material given at graduation.   Intimacy: - Group verbal instruction through game format to discuss how heart and lung disease can affect sexual intimacy. Written material given at graduation..   Know Your Numbers and Heart Failure: - Group verbal and visual instruction to discuss disease risk factors for cardiac and pulmonary disease and treatment options.  Reviews associated critical values for Overweight/Obesity, Hypertension, Cholesterol, and Diabetes.  Discusses basics of heart failure: signs/symptoms and treatments.  Introduces Heart Failure Zone chart for action plan for heart failure.  Written material given at graduation.   Infection Prevention: - Provides verbal and written material to individual with discussion of infection control including proper hand washing and proper equipment cleaning  during exercise session. Flowsheet Row Cardiac Rehab from 03/04/2022 in [facility] Cardiac and Pulmonary Rehab  Date 03/04/22  Educator [facility]  Instruction Review Code 1- Verbalizes Understanding       Falls Prevention: - Provides verbal and written material to individual with discussion of falls prevention and safety. Flowsheet Row Cardiac Rehab from 03/04/2022 in [facility] Cardiac and Pulmonary Rehab  Date 03/04/22  Educator [facility]  Instruction Review Code 1- Verbalizes Understanding       Other: -Provides group and verbal instruction on various topics (see comments)   Knowledge Questionnaire Score:  Knowledge Questionnaire Score - 03/04/22 1737       Knowledge Questionnaire Score   Pre Score 20/26             Core Components/Risk Factors/Patient Goals at Admission:  Personal Goals and Risk Factors at Admission - 03/04/22 1733       Core Components/Risk Factors/Patient Goals on Admission    Weight Management Yes;Weight Gain    Intervention Weight Management: Develop a combined nutrition and exercise program designed to reach desired caloric intake, while maintaining appropriate intake of nutrient and fiber, sodium and fats, and appropriate energy expenditure required for the weight goal.;Weight Management: Provide education and appropriate resources to help participant work on and attain dietary goals.    Admit Weight 175 lb 1.6 oz (79.4 kg)    Goal Weight: Short Term 180 lb (81.6 kg)    Goal Weight: Long Term 180 lb (81.6 kg)    Expected Outcomes Weight Maintenance: Understanding of the daily nutrition guidelines, which includes 25-35% calories from fat, 7% or less cal from saturated fats, less than $RemoveB'200mg'diZunGvW$  cholesterol, less than 1.5gm of sodium, & 5 or more servings of fruits and vegetables daily;Short Term: Continue to assess and modify interventions until short term weight is achieved;Long Term: Adherence  to nutrition and physical activity/exercise program aimed toward attainment of  established weight goal;Understanding recommendations for meals to include 15-35% energy as protein, 25-35% energy from fat, 35-60% energy from carbohydrates, less than $RemoveB'200mg'qecyEwIW$  of dietary cholesterol, 20-35 gm of total fiber daily;Weight Gain: Understanding of general recommendations for a high calorie, high protein meal plan that promotes weight gain by distributing calorie intake throughout the day with the consumption for 4-5 meals, snacks, and/or supplements;Understanding of distribution of calorie intake throughout the day with the consumption of 4-5 meals/snacks    Tobacco Cessation Yes    Number of packs per day Jaishaun is a recent tobacco user. Intervention for tobacco cessation was provided at the initial medical review. He was asked about readiness to quit and reported he quit a few weeks ago . Staff will continue to provide cessation encouragement and follow up with the patient throughout the program.    Intervention Assist the participant in steps to quit. Provide individualized education and counseling about committing to Tobacco Cessation, relapse prevention, and pharmacological support that can be provided by physician.;Advice worker, assist with locating and accessing local/national Quit Smoking programs, and support quit date choice.    Expected Outcomes Short Term: Will demonstrate readiness to quit, by selecting a quit date.;Short Term: Will quit all tobacco product use, adhering to prevention of relapse plan.;Long Term: Complete abstinence from all tobacco products for at least 12 months from quit date.    Diabetes Yes    Intervention Provide education about signs/symptoms and action to take for hypo/hyperglycemia.;Provide education about proper nutrition, including hydration, and aerobic/resistive exercise prescription along with prescribed medications to achieve blood glucose in normal ranges: Fasting glucose 65-99 mg/dL    Expected Outcomes Short Term: Participant  verbalizes understanding of the signs/symptoms and immediate care of hyper/hypoglycemia, proper foot care and importance of medication, aerobic/resistive exercise and nutrition plan for blood glucose control.;Long Term: Attainment of HbA1C < 7%.    Hypertension Yes    Intervention Provide education on lifestyle modifcations including regular physical activity/exercise, weight management, moderate sodium restriction and increased consumption of fresh fruit, vegetables, and low fat dairy, alcohol moderation, and smoking cessation.;Monitor prescription use compliance.    Expected Outcomes Short Term: Continued assessment and intervention until BP is < 140/72mm HG in hypertensive participants. < 130/38mm HG in hypertensive participants with diabetes, heart failure or chronic kidney disease.;Long Term: Maintenance of blood pressure at goal levels.    Lipids Yes    Intervention Provide education and support for participant on nutrition & aerobic/resistive exercise along with prescribed medications to achieve LDL '70mg'$ , HDL >$Remo'40mg'RVCjS$ .    Expected Outcomes Short Term: Participant states understanding of desired cholesterol values and is compliant with medications prescribed. Participant is following exercise prescription and nutrition guidelines.;Long Term: Cholesterol controlled with medications as prescribed, with individualized exercise RX and with personalized nutrition plan. Value goals: LDL < $Rem'70mg'OTnS$ , HDL > 40 mg.             Education:Diabetes - Individual verbal and written instruction to review signs/symptoms of diabetes, desired ranges of glucose level fasting, after meals and with exercise. Acknowledge that pre and post exercise glucose checks will be done for 3 sessions at entry of program. [place] from 03/04/2022 in [facility] Cardiac and Pulmonary Rehab  Date 03/04/22  Educator [facility]  Instruction Review Code 1- Verbalizes Understanding       Core Components/Risk Factors/Patient Goals  Review:   Goals and Risk Factor Review     Row  Name 03/04/22 1730 04/04/22 1739 04/24/22 1733         Core Components/Risk Factors/Patient Goals Review   Personal Goals Review Tobacco Cessation Weight Management/Obesity;Diabetes;Tobacco Cessation Weight Management/Obesity;Diabetes;Tobacco Cessation     Review Amarion Portell has recently quit tobacco use within the last 6 months. Intervention for relapse prevention was provided at the initial medical review. He was encouraged to continue to with tobacco cessation and was provided information on relapse prevention. Patient received information about combination therapy, tobacco cessation classes, quit line, and quit smoking apps in case of a relapse. Patient demonstrated understanding of this material.Staff will continue to provide encouragement and follow up with the patient throughout the program. Ron wants to work on his weight. He wants to lose a few pounds. He states that he does not check his sugars at home.He has means to check his sugar at home. Informed him that it would be good to keep his sugars in check. He states that he has been smoking a few cigaretts a day. He states that he is not interested in quitting smoking all together. Ron states that he does not smoke enough to affect him. Ron said he is maintaining his weight, but is still looking to lose a few pounds He is trying to be more active. He is wanting to meet with the dietitian to see what else he can do to help lose weight. He states his daibetes is being managed by his medication, but he is not checking it unless he is symptomatic. His wife is a Marine scientist and helps manage this as well. His blood pressure has been stable and he reports no issues with his medication. He is smoking about 2 cigs a day, which is a decrease from his prior to his MI but he is wanting to quit completely. The cessation packet was given and education provided on quitting tips     Expected Outcomes Short: continue no  to smoke Long: maintain tobacco free lifestyle. Short: decrease tobacco use and  monitor blood sugars. Long: maintain blood sugars and tobacco cessation independently. Short: decrease to one cig and meet with dietician. Long: quit tobacco, meet target weight.              Core Components/Risk Factors/Patient Goals at Discharge (Final Review):   Goals and Risk Factor Review - 04/24/22 1733       Core Components/Risk Factors/Patient Goals Review   Personal Goals Review Weight Management/Obesity;Diabetes;Tobacco Cessation    Review Ron said he is maintaining his weight, but is still looking to lose a few pounds He is trying to be more active. He is wanting to meet with the dietitian to see what else he can do to help lose weight. He states his daibetes is being managed by his medication, but he is not checking it unless he is symptomatic. His wife is a Marine scientist and helps manage this as well. His blood pressure has been stable and he reports no issues with his medication. He is smoking about 2 cigs a day, which is a decrease from his prior to his MI but he is wanting to quit completely. The cessation packet was given and education provided on quitting tips    Expected Outcomes Short: decrease to one cig and meet with dietician. Long: quit tobacco, meet target weight.             ITP Comments:  ITP Comments     Row Name 02/18/22 1507 03/04/22 1732 03/13/22 1356 03/13/22 1739 04/09/22 1153  ITP Comments Virtual orientation call completed today. he has an appointment on Date: 03/04/2022  for EP eval and gym Orientation.  Documentation of diagnosis can be found in  [facility]  Date: 02/08/2022  Alfonzia is a recent tobacco user. Intervention for tobacco cessation was provided at the initial medical review. He was asked about readiness to quit and reported he quit a few weeks ago . Staff will continue to provide cessation encouragement and follow up with the patient throughout the program. Completed 6MWT and  gym orientation. Initial ITP created and sent for review to Dr. Emily Filbert, Medical Director.Ron Sesler has recently quit tobacco use within the last 6 months. Intervention for relapse prevention was provided at the initial medical review. He was encouraged to continue to with tobacco cessation and was provided information on relapse prevention. Patient received information about combination therapy, tobacco cessation classes, quit line, and quit smoking apps in case of a relapse. Patient demonstrated understanding of this material.Staff will continue to provide encouragement and follow up with the patient throughout the program.  Note was sent to Dr. Saunders Revel reguarding BP parameters during exercise for patient. 30 Day review completed. Medical Director ITP review done, changes made as directed, and signed approval by Medical Director. First full day of exercise!  Patient was oriented to gym and equipment including functions, settings, policies, and procedures.  Patient's individual exercise prescription and treatment plan were reviewed.  All starting workloads were established based on the results of the 6 minute walk test done at initial orientation visit.  The plan for exercise progression was also introduced and progression will be customized based on patient's performance and goals. Patient attendance has been inconsistent.    [place] Name 04/10/22 1036 05/08/22 1001         ITP Comments 30 Day review completed. Medical Director ITP review done, changes made as directed, and signed approval by Medical Director. 30 Day review completed. Medical Director ITP review done, changes made as directed, and signed approval by Medical Director.               Comments:

## 2022-05-08 NOTE — Progress Notes (Signed)
Daily Session Note  Patient Details  Name: Daniel Fuentes MRN: [id number] Date of Birth: 1951-06-12 Referring Provider:   Flowsheet Row Cardiac Rehab from 03/04/2022 in [facility] Cardiac and Pulmonary Rehab  Referring Provider Dr. Saunders Revel       Encounter Date: 05/08/2022  Check In:  Session Check In - 05/08/22 1808       Check-In   Supervising physician immediately available to respond to emergencies See telemetry face sheet for immediately available ER MD    Location [facility]-Cardiac & Pulmonary Rehab    Staff Present Heath Lark, RN, BSN, Jacklynn Bue, MS, ASCM CEP, Exercise Physiologist;Laureen Owens Shark, BS, RRT, CPFT    Virtual Visit No    Medication changes reported     No    Fall or balance concerns reported    No    Warm-up and Cool-down Performed on first and last piece of equipment    Resistance Training Performed Yes    VAD Patient? No    PAD/SET Patient? No      Pain Assessment   Currently in Pain? No/denies                Social History   Tobacco Use  Smoking Status Former   Packs/day: 0.20   Years: 50.00   Total pack years: 10.00   Types: Pipe, Cigarettes   Start date: 09/04/1966   Quit date: 02/08/2022   Years since quitting: 0.2  Smokeless Tobacco Never    Goals Met:  Independence with exercise equipment Exercise tolerated well No report of concerns or symptoms today  Goals Unmet:  Not Applicable  Comments: Pt able to follow exercise prescription today without complaint.  Will continue to monitor for progression.    Dr. Emily Filbert is Medical Director for [place].  Dr. Ottie Glazier is Medical Director for [facility] Pulmonary Rehabilitation.

## 2022-05-09 DIAGNOSIS — I214 Non-ST elevation (NSTEMI) myocardial infarction: Secondary | ICD-10-CM

## 2022-05-09 DIAGNOSIS — Z955 Presence of coronary angioplasty implant and graft: Secondary | ICD-10-CM

## 2022-05-09 NOTE — Progress Notes (Signed)
Daily Session Note  Patient Details  Name: Daniel Fuentes MRN: [id number] Date of Birth: [birth year] Referring Provider:   Flowsheet Row Cardiac Rehab from 03/04/2022 in [facility] Cardiac and Pulmonary Rehab  Referring Provider Dr. Saunders Revel       Encounter Date: 05/09/2022  Check In:  Session Check In - 05/09/22 1726       Check-In   Supervising physician immediately available to respond to emergencies See telemetry face sheet for immediately available ER MD    Location [facility]-Cardiac & Pulmonary Rehab    Staff Present Vida Rigger, RN, Margurite Auerbach, MS, ASCM CEP, Exercise Physiologist;Noah Tickle, BS, Exercise Physiologist    Virtual Visit No    Medication changes reported     No    Fall or balance concerns reported    No    Tobacco Cessation No Change    Warm-up and Cool-down Performed on first and last piece of equipment    Resistance Training Performed Yes    VAD Patient? No    PAD/SET Patient? No      Pain Assessment   Currently in Pain? No/denies                Social History   Tobacco Use  Smoking Status Former   Packs/day: 0.20   Years: 50.00   Total pack years: 10.00   Types: Pipe, Cigarettes   Start date: 09/04/1966   Quit date: 02/08/2022   Years since quitting: 0.2  Smokeless Tobacco Never    Goals Met:  Independence with exercise equipment Exercise tolerated well No report of concerns or symptoms today Strength training completed today  Goals Unmet:  Not Applicable  Comments: Pt able to follow exercise prescription today without complaint.  Will continue to monitor for progression.   Dr. Emily Filbert is Medical Director for [place].  Dr. Ottie Glazier is Medical Director for [facility] Pulmonary Rehabilitation.

## 2022-05-13 ENCOUNTER — Encounter: Payer: Medicare Other | Admitting: *Deleted

## 2022-05-13 DIAGNOSIS — I214 Non-ST elevation (NSTEMI) myocardial infarction: Secondary | ICD-10-CM

## 2022-05-13 DIAGNOSIS — Z955 Presence of coronary angioplasty implant and graft: Secondary | ICD-10-CM

## 2022-05-13 NOTE — Progress Notes (Signed)
Daily Session Note  Patient Details  Name: Daniel Fuentes MRN: [id number] Date of Birth: [birth year] Referring Provider:   Flowsheet Row Cardiac Rehab from 03/04/2022 in [facility] Cardiac and Pulmonary Rehab  Referring Provider Dr. Saunders Revel       Encounter Date: 05/13/2022  Check In:  Session Check In - 05/13/22 1730       Check-In   Supervising physician immediately available to respond to emergencies See telemetry face sheet for immediately available ER MD    Location [facility]-Cardiac & Pulmonary Rehab    Staff Present Renita Papa, RN Moises Blood, BS, ACSM CEP, Exercise Physiologist;Melissa [place]il[place] Pillar, RDN, LDN    Virtual Visit No    Medication changes reported     No    Fall or balance concerns reported    No    Tobacco Cessation No Change    Current number of cigarettes/nicotine per day     3    Warm-up and Cool-down Performed on first and last piece of equipment    Resistance Training Performed Yes    VAD Patient? No    PAD/SET Patient? No      Pain Assessment   Currently in Pain? No/denies                Social History   Tobacco Use  Smoking Status Former   Packs/day: 0.20   Years: 50.00   Total pack years: 10.00   Types: Pipe, Cigarettes   Start date: 09/04/1966   Quit date: 02/08/2022   Years since quitting: 0.2  Smokeless Tobacco Never    Goals Met:  Independence with exercise equipment Exercise tolerated well No report of concerns or symptoms today Strength training completed today  Goals Unmet:  Not Applicable  Comments: Pt able to follow exercise prescription today without complaint.  Will continue to monitor for progression.    Dr. Emily Filbert is Medical Director for [place].  Dr. Ottie Glazier is Medical Director for [facility] Pulmonary Rehabilitation.

## 2022-05-15 ENCOUNTER — Encounter: Payer: Medicare Other | Admitting: *Deleted

## 2022-05-15 DIAGNOSIS — I214 Non-ST elevation (NSTEMI) myocardial infarction: Secondary | ICD-10-CM

## 2022-05-15 DIAGNOSIS — Z955 Presence of coronary angioplasty implant and graft: Secondary | ICD-10-CM

## 2022-05-15 NOTE — Progress Notes (Signed)
Daily Session Note  Patient Details  Name: Elwin Tsou MRN: [id number] Date of Birth: 02-24-1951 Referring Provider:   Flowsheet Row Cardiac Rehab from 03/04/2022 in [facility] Cardiac and Pulmonary Rehab  Referring Provider Dr. Saunders Revel       Encounter Date: 05/15/2022  Check In:  Session Check In - 05/15/22 1730       Check-In   Supervising physician immediately available to respond to emergencies See telemetry face sheet for immediately available ER MD    Location [facility]Cardiac & Pulmonary Rehab    Staff Present Hope Budds, RDN, Tawanna Solo, MS, ASCM CEP, Exercise Physiologist;Zianne Schubring Sherryll Burger, RN BSN    Virtual Visit No    Medication changes reported     No    Fall or balance concerns reported    No    Tobacco Cessation No Change    Current number of cigarettes/nicotine per day     3    Warm-up and Cool-down Performed on first and last piece of equipment    Resistance Training Performed Yes    VAD Patient? No    PAD/SET Patient? No      Pain Assessment   Currently in Pain? No/denies                Social History   Tobacco Use  Smoking Status Former   Packs/day: 0.20   Years: 50.00   Total pack years: 10.00   Types: Pipe, Cigarettes   Start date: 09/04/1966   Quit date: 02/08/2022   Years since quitting: 0.2  Smokeless Tobacco Never    Goals Met:  Independence with exercise equipment Exercise tolerated well No report of concerns or symptoms today Strength training completed today  Goals Unmet:  Not Applicable  Comments: Pt able to follow exercise prescription today without complaint.  Will continue to monitor for progression.    Dr. Emily Filbert is Medical Director for [place].  Dr. Ottie Glazier is Medical Director for [facility] Pulmonary Rehabilitation.

## 2022-05-16 DIAGNOSIS — I214 Non-ST elevation (NSTEMI) myocardial infarction: Secondary | ICD-10-CM

## 2022-05-16 DIAGNOSIS — Z955 Presence of coronary angioplasty implant and graft: Secondary | ICD-10-CM

## 2022-05-16 NOTE — Progress Notes (Signed)
Daily Session Note  Patient Details  Name: Daniel Fuentes MRN: [id number] Date of Birth: 10/22/1950 Referring Provider:   Flowsheet Row Cardiac Rehab from 03/04/2022 in [facility] Cardiac and Pulmonary Rehab  Referring Provider Dr. Saunders Revel       Encounter Date: 05/16/2022  Check In:  Session Check In - 05/16/22 1734       Check-In   Supervising physician immediately available to respond to emergencies See telemetry face sheet for immediately available ER MD    Location [facility]Cardiac & Pulmonary Rehab    Staff Present Renita Papa, RN BSN;Haji Delaine, RN, Margurite Auerbach, MS, ASCM CEP, Exercise Physiologist    Virtual Visit No    Medication changes reported     No    Fall or balance concerns reported    No    Tobacco Cessation No Change    Current number of cigarettes/nicotine per day     3    Warm-up and Cool-down Performed on first and last piece of equipment    Resistance Training Performed Yes    VAD Patient? No    PAD/SET Patient? No      Pain Assessment   Currently in Pain? No/denies                Social History   Tobacco Use  Smoking Status Former   Packs/day: 0.20   Years: 50.00   Total pack years: 10.00   Types: Pipe, Cigarettes   Start date: 09/04/1966   Quit date: 02/08/2022   Years since quitting: 0.2  Smokeless Tobacco Never    Goals Met:  Independence with exercise equipment Exercise tolerated well No report of concerns or symptoms today Strength training completed today  Goals Unmet:  Not Applicable  Comments: Pt able to follow exercise prescription today without complaint.  Will continue to monitor for progression.   Dr. Emily Filbert is Medical Director for [place].  Dr. Ottie Glazier is Medical Director for [facility] Pulmonary Rehabilitation.

## 2022-05-20 ENCOUNTER — Encounter: Payer: Medicare Other | Admitting: *Deleted

## 2022-05-20 DIAGNOSIS — Z955 Presence of coronary angioplasty implant and graft: Secondary | ICD-10-CM

## 2022-05-20 DIAGNOSIS — I214 Non-ST elevation (NSTEMI) myocardial infarction: Secondary | ICD-10-CM | POA: Diagnosis not present

## 2022-05-20 NOTE — Progress Notes (Signed)
Daily Session Note  Patient Details  Name: Hy Swiatek MRN: [id number] Date of Birth: December 11, 1950 Referring Provider:   Flowsheet Row Cardiac Rehab from 03/04/2022 in [facility] Cardiac and Pulmonary Rehab  Referring Provider Dr. Saunders Revel       Encounter Date: 05/20/2022  Check In:  Session Check In - 05/20/22 1719       Check-In   Supervising physician immediately available to respond to emergencies See telemetry face sheet for immediately available ER MD    Location [facility]Cardiac & Pulmonary Rehab    Staff Present Renita Papa, RN BSN;Joseph Tessie Fass, RCP,RRT,BSRT;Melissa [place], [place], LDN    Virtual Visit No    Medication changes reported     No    Fall or balance concerns reported    No    Tobacco Cessation No Change    Current number of cigarettes/nicotine per day     3    Warm-up and Cool-down Performed on first and last piece of equipment    Resistance Training Performed Yes    VAD Patient? No    PAD/SET Patient? No      Pain Assessment   Currently in Pain? No/denies                Social History   Tobacco Use  Smoking Status Former   Packs/day: 0.20   Years: 50.00   Total pack years: 10.00   Types: Pipe, Cigarettes   Start date: 09/04/1966   Quit date: 02/08/2022   Years since quitting: 0.2  Smokeless Tobacco Never    Goals Met:  Independence with exercise equipment Exercise tolerated well No report of concerns or symptoms today Strength training completed today  Goals Unmet:  Not Applicable  Comments: Pt able to follow exercise prescription today without complaint.  Will continue to monitor for progression.    Dr. Emily Filbert is Medical Director for [place].  Dr. Ottie Glazier is Medical Director for [facility] Pulmonary Rehabilitation.

## 2022-05-21 ENCOUNTER — Ambulatory Visit (INDEPENDENT_AMBULATORY_CARE_PROVIDER_SITE_OTHER): Payer: Medicare Other

## 2022-05-21 DIAGNOSIS — I714 Abdominal aortic aneurysm, without rupture, unspecified: Secondary | ICD-10-CM

## 2022-05-21 DIAGNOSIS — I7143 Infrarenal abdominal aortic aneurysm, without rupture: Secondary | ICD-10-CM

## 2022-05-22 ENCOUNTER — Encounter: Payer: Medicare Other | Admitting: *Deleted

## 2022-05-22 ENCOUNTER — Telehealth: Payer: Self-pay | Admitting: *Deleted

## 2022-05-22 DIAGNOSIS — I214 Non-ST elevation (NSTEMI) myocardial infarction: Secondary | ICD-10-CM

## 2022-05-22 DIAGNOSIS — I7789 Other specified disorders of arteries and arterioles: Secondary | ICD-10-CM

## 2022-05-22 DIAGNOSIS — Z955 Presence of coronary angioplasty implant and graft: Secondary | ICD-10-CM

## 2022-05-22 DIAGNOSIS — I771 Stricture of artery: Secondary | ICD-10-CM

## 2022-05-22 NOTE — Telephone Encounter (Signed)
-----   Message from Nelva Bush, MD sent at 05/22/2022  2:44 PM EDT ----- Please let Daniel Fuentes that he has mild enlargement of his abdominal aorta.  There also appears to be at least moderate narrowing in both iliac arteries.  I suggest that we obtain ABIs and bilateral iliac Dopplers at his convenience for further evaluation.

## 2022-05-22 NOTE — Telephone Encounter (Signed)
Called and spoke with pt, notified of results and Dr. Darnelle Bos recc.  Pt voiced understanding.   Orders placed for ABI's and bilateral iliac dopplers.  Pt aware our office will call him shortly to schedule both procedures to be done in our office.   Pt has no further questions at this time.  Forwarded to [place].

## 2022-05-22 NOTE — Progress Notes (Signed)
Daily Session Note  Patient Details  Name: Daniel Fuentes MRN: [id number] Date of Birth: [birth year] Referring Provider:   Flowsheet Row Cardiac Rehab from 03/04/2022 in [facility] Cardiac and Pulmonary Rehab  Referring Provider Dr. Saunders Revel       Encounter Date: 05/22/2022  Check In:  Session Check In - 05/22/22 1725       Check-In   Supervising physician immediately available to respond to emergencies See telemetry face sheet for immediately available ER MD    Location [facility]Cardiac & Pulmonary Rehab    Staff Present Hope Budds, RDN, LDN;Joseph [place], RCP,RRT,BSRT;Bolden Hagerman Sherryll Burger, RN BSN    Virtual Visit No    Medication changes reported     No    Fall or balance concerns reported    No    Tobacco Cessation No Change    Current number of cigarettes/nicotine per day     3    Warm-up and Cool-down Performed on first and last piece of equipment    Resistance Training Performed Yes    VAD Patient? No    PAD/SET Patient? No      Pain Assessment   Currently in Pain? No/denies                Social History   Tobacco Use  Smoking Status Former   Packs/day: 0.20   Years: 50.00   Total pack years: 10.00   Types: Pipe, Cigarettes   Start date: 09/04/1966   Quit date: 02/08/2022   Years since quitting: 0.2  Smokeless Tobacco Never    Goals Met:  Independence with exercise equipment Exercise tolerated well No report of concerns or symptoms today Strength training completed today  Goals Unmet:  Not Applicable  Comments: Pt able to follow exercise prescription today without complaint.  Will continue to monitor for progression.    Dr. Emily Filbert is Medical Director for [place].  Dr. Ottie Glazier is Medical Director for [facility] Pulmonary Rehabilitation.

## 2022-05-23 ENCOUNTER — Encounter: Payer: Medicare Other | Admitting: *Deleted

## 2022-05-23 DIAGNOSIS — Z955 Presence of coronary angioplasty implant and graft: Secondary | ICD-10-CM

## 2022-05-23 DIAGNOSIS — I214 Non-ST elevation (NSTEMI) myocardial infarction: Secondary | ICD-10-CM

## 2022-05-23 NOTE — Progress Notes (Signed)
Daily Session Note  Patient Details  Name: Daniel Fuentes MRN: [id number] Date of Birth: [birth year] Referring Provider:   Flowsheet Row Cardiac Rehab from 03/04/2022 in [facility] Cardiac and Pulmonary Rehab  Referring Provider Dr. Saunders Revel       Encounter Date: 05/23/2022  Check In:  Session Check In - 05/23/22 1727       Check-In   Supervising physician immediately available to respond to emergencies See telemetry face sheet for immediately available ER MD    Location [facility]Cardiac & Pulmonary Rehab    Staff Present Renita Papa, RN BSN;Laureen Owens Shark, BS, RRT, CPFT;Joseph [place], [place]    Virtual Visit No    Medication changes reported     No    Fall or balance concerns reported    No    Tobacco Cessation No Change    Current number of cigarettes/nicotine per day     3    Warm-up and Cool-down Performed on first and last piece of equipment    Resistance Training Performed Yes    VAD Patient? No    PAD/SET Patient? No      Pain Assessment   Currently in Pain? No/denies                Social History   Tobacco Use  Smoking Status Former   Packs/day: 0.20   Years: 50.00   Total pack years: 10.00   Types: Pipe, Cigarettes   Start date: 09/04/1966   Quit date: 02/08/2022   Years since quitting: 0.2  Smokeless Tobacco Never    Goals Met:  Proper associated with RPD/PD & O2 Sat Independence with exercise equipment Exercise tolerated well No report of concerns or symptoms today Strength training completed today  Goals Unmet:  Not Applicable  Comments: Pt able to follow exercise prescription today without complaint.  Will continue to monitor for progression.    Dr. Emily Filbert is Medical Director for [place].  Dr. Ottie Glazier is Medical Director for [facility] Pulmonary Rehabilitation.

## 2022-05-27 ENCOUNTER — Encounter: Payer: Medicare Other | Admitting: *Deleted

## 2022-05-27 DIAGNOSIS — I214 Non-ST elevation (NSTEMI) myocardial infarction: Secondary | ICD-10-CM | POA: Diagnosis not present

## 2022-05-27 DIAGNOSIS — Z955 Presence of coronary angioplasty implant and graft: Secondary | ICD-10-CM

## 2022-05-27 NOTE — Progress Notes (Signed)
Daily Session Note  Patient Details  Name: Daniel Fuentes MRN: [id number] Date of Birth: [birth year] Referring Provider:   Flowsheet Row Cardiac Rehab from 03/04/2022 in [facility] Cardiac and Pulmonary Rehab  Referring Provider Dr. Saunders Revel       Encounter Date: 05/27/2022  Check In:  Session Check In - 05/27/22 1721       Check-In   Supervising physician immediately available to respond to emergencies See telemetry face sheet for immediately available ER MD    Location [facility]Cardiac & Pulmonary Rehab    Staff Present Renita Papa, RN BSN;Joseph Tessie Fass, RCP,RRT,BSRT;Melissa [place], [place], LDN    Virtual Visit No    Medication changes reported     No    Fall or balance concerns reported    No    Tobacco Cessation No Change    Current number of cigarettes/nicotine per day     3    Warm-up and Cool-down Performed on first and last piece of equipment    Resistance Training Performed Yes    VAD Patient? No    PAD/SET Patient? No      Pain Assessment   Currently in Pain? No/denies                Social History   Tobacco Use  Smoking Status Former   Packs/day: 0.20   Years: 50.00   Total pack years: 10.00   Types: Pipe, Cigarettes   Start date: 09/04/1966   Quit date: 02/08/2022   Years since quitting: 0.2  Smokeless Tobacco Never    Goals Met:  Independence with exercise equipment Exercise tolerated well No report of concerns or symptoms today Strength training completed today  Goals Unmet:  Not Applicable  Comments: Pt able to follow exercise prescription today without complaint.  Will continue to monitor for progression.    Dr. Emily Filbert is Medical Director for [place].  Dr. Ottie Glazier is Medical Director for [facility] Pulmonary Rehabilitation.

## 2022-05-29 ENCOUNTER — Encounter: Payer: Medicare Other | Admitting: *Deleted

## 2022-05-29 DIAGNOSIS — Z955 Presence of coronary angioplasty implant and graft: Secondary | ICD-10-CM

## 2022-05-29 DIAGNOSIS — I214 Non-ST elevation (NSTEMI) myocardial infarction: Secondary | ICD-10-CM | POA: Diagnosis not present

## 2022-05-29 NOTE — Progress Notes (Signed)
Daily Session Note  Patient Details  Name: Daniel Fuentes MRN: [id number] Date of Birth: 03-06-1951 Referring Provider:   Flowsheet Row Cardiac Rehab from 03/04/2022 in [facility] Cardiac and Pulmonary Rehab  Referring Provider Dr. Saunders Revel       Encounter Date: 05/29/2022  Check In:  Session Check In - 05/29/22 1718       Check-In   Supervising physician immediately available to respond to emergencies See telemetry face sheet for immediately available ER MD    Location [facility]Cardiac & Pulmonary Rehab    Staff Present Renita Papa, RN BSN;Joseph Tessie Fass, RCP,RRT,BSRT;Melissa [place], [place], LDN    Virtual Visit No    Medication changes reported     No    Fall or balance concerns reported    No    Tobacco Cessation No Change    Current number of cigarettes/nicotine per day     3    Warm-up and Cool-down Performed on first and last piece of equipment    Resistance Training Performed Yes    VAD Patient? No    PAD/SET Patient? No      Pain Assessment   Currently in Pain? No/denies                Social History   Tobacco Use  Smoking Status Former   Packs/day: 0.20   Years: 50.00   Total pack years: 10.00   Types: Pipe, Cigarettes   Start date: 09/04/1966   Quit date: 02/08/2022   Years since quitting: 0.3  Smokeless Tobacco Never    Goals Met:  Independence with exercise equipment Exercise tolerated well No report of concerns or symptoms today Strength training completed today  Goals Unmet:  Not Applicable  Comments: Pt able to follow exercise prescription today without complaint.  Will continue to monitor for progression.    Dr. Emily Filbert is Medical Director for [place].  Dr. Ottie Glazier is Medical Director for [facility] Pulmonary Rehabilitation.

## 2022-05-30 ENCOUNTER — Encounter: Payer: Medicare Other | Admitting: *Deleted

## 2022-05-30 DIAGNOSIS — I214 Non-ST elevation (NSTEMI) myocardial infarction: Secondary | ICD-10-CM | POA: Diagnosis not present

## 2022-05-30 DIAGNOSIS — Z955 Presence of coronary angioplasty implant and graft: Secondary | ICD-10-CM

## 2022-05-30 NOTE — Progress Notes (Signed)
Daily Session Note  Patient Details  Name: Daniel Fuentes MRN: [id number] Date of Birth: 12-23-1950 Referring Provider:   Flowsheet Row Cardiac Rehab from 03/04/2022 in [facility] Cardiac and Pulmonary Rehab  Referring Provider Dr. Saunders Revel       Encounter Date: 05/30/2022  Check In:  Session Check In - 05/30/22 1720       Check-In   Supervising physician immediately available to respond to emergencies See telemetry face sheet for immediately available ER MD    Location [facility]-Cardiac & Pulmonary Rehab    Staff Present Renita Papa, RN BSN;Joseph [place], RCP,RRT,BSRT;Kara [place], [place], ASCM CEP, Exercise Physiologist    Virtual Visit No    Medication changes reported     No    Fall or balance concerns reported    No    Tobacco Cessation No Change    Current number of cigarettes/nicotine per day     3    Warm-up and Cool-down Performed on first and last piece of equipment    Resistance Training Performed Yes    VAD Patient? No    PAD/SET Patient? No      Pain Assessment   Currently in Pain? No/denies                Social History   Tobacco Use  Smoking Status Former   Packs/day: 0.20   Years: 50.00   Total pack years: 10.00   Types: Pipe, Cigarettes   Start date: 09/04/1966   Quit date: 02/08/2022   Years since quitting: 0.3  Smokeless Tobacco Never    Goals Met:  Independence with exercise equipment Exercise tolerated well No report of concerns or symptoms today Strength training completed today  Goals Unmet:  Not Applicable  Comments: Pt able to follow exercise prescription today without complaint.  Will continue to monitor for progression.    Dr. Emily Filbert is Medical Director for [place].  Dr. Ottie Glazier is Medical Director for [facility] Pulmonary Rehabilitation.

## 2022-06-05 ENCOUNTER — Encounter: Payer: Self-pay | Admitting: *Deleted

## 2022-06-05 ENCOUNTER — Encounter: Payer: Medicare Other | Attending: Cardiovascular Disease | Admitting: *Deleted

## 2022-06-05 DIAGNOSIS — Z48812 Encounter for surgical aftercare following surgery on the circulatory system: Secondary | ICD-10-CM | POA: Insufficient documentation

## 2022-06-05 DIAGNOSIS — I252 Old myocardial infarction: Secondary | ICD-10-CM | POA: Insufficient documentation

## 2022-06-05 DIAGNOSIS — I214 Non-ST elevation (NSTEMI) myocardial infarction: Secondary | ICD-10-CM

## 2022-06-05 DIAGNOSIS — Z955 Presence of coronary angioplasty implant and graft: Secondary | ICD-10-CM

## 2022-06-05 NOTE — Progress Notes (Signed)
Daily Session Note  Patient Details  Name: Kairos Panetta MRN: [id number] Date of Birth: 08/09/1951 Referring Provider:   Flowsheet Row Cardiac Rehab from 03/04/2022 in [facility] Cardiac and Pulmonary Rehab  Referring Provider Dr. Saunders Revel       Encounter Date: 06/05/2022  Check In:  Session Check In - 06/05/22 1711       Check-In   Supervising physician immediately available to respond to emergencies See telemetry face sheet for immediately available ER MD    Location [facility]-Cardiac & Pulmonary Rehab    Staff Present Renita Papa, RN BSN;Joseph [place], RCP,RRT,BSRT;Laureen [place], [place], RRT, CPFT    Virtual Visit No    Medication changes reported     No    Fall or balance concerns reported    No    Tobacco Cessation No Change    Current number of cigarettes/nicotine per day     3    Warm-up and Cool-down Performed on first and last piece of equipment    Resistance Training Performed Yes    VAD Patient? No    PAD/SET Patient? No      Pain Assessment   Currently in Pain? No/denies                Social History   Tobacco Use  Smoking Status Former   Packs/day: 0.20   Years: 50.00   Total pack years: 10.00   Types: Pipe, Cigarettes   Start date: 09/04/1966   Quit date: 02/08/2022   Years since quitting: 0.3  Smokeless Tobacco Never    Goals Met:  Independence with exercise equipment Exercise tolerated well No report of concerns or symptoms today Strength training completed today  Goals Unmet:  Not Applicable  Comments: Pt able to follow exercise prescription today without complaint.  Will continue to monitor for progression.    Dr. Emily Filbert is Medical Director for [place].  Dr. Ottie Glazier is Medical Director for [facility] Pulmonary Rehabilitation.

## 2022-06-05 NOTE — Progress Notes (Signed)
Cardiac Individual Treatment Plan  Patient Details  Name: Daniel Fuentes MRN: [id number] Date of Birth: November 23, 1950 Referring Provider:   Flowsheet Row Cardiac Rehab from 03/04/2022 in [facility] Cardiac and Pulmonary Rehab  Referring Provider Dr. Saunders Revel       Initial Encounter Date:  Flowsheet Row Cardiac Rehab from 03/04/2022 in [facility] Cardiac and Pulmonary Rehab  Date 03/04/22       Visit Diagnosis: NSTEMI (non-ST elevated myocardial infarction) [facility])  Status post coronary artery stent placement  Patient's Home Medications on Admission:  Current Outpatient Medications:    amLODipine (NORVASC) 10 MG tablet, Take 1 tablet (10 mg total) by mouth daily., Disp: 90 tablet, Rfl: 3   aspirin EC 81 MG tablet, Take 1 tablet (81 mg total) by mouth daily., Disp: 30 tablet, Rfl: 0   carvedilol (COREG) 6.25 MG tablet, Take 1 tablet (6.25 mg total) by mouth 2 (two) times daily with a meal., Disp: 180 tablet, Rfl: 3   Cholecalciferol (VITAMIN D3) 2000 units capsule, Take 2,000 Units by mouth daily., Disp: , Rfl:    Evolocumab (REPATHA SURECLICK) 409 MG/ML SOAJ, Inject 140 mg into the skin every 14 (fourteen) days., Disp: 2 mL, Rfl: 11   losartan (COZAAR) 25 MG tablet, Take 1 tablet (25 mg total) by mouth daily., Disp: 90 tablet, Rfl: 3   metFORMIN (GLUCOPHAGE) 1000 MG tablet, Take 1 tablet (1,000 mg total) by mouth daily. Start tomorrow, Disp: , Rfl:    Multiple Vitamins-Minerals (MULTIVITAMIN WITH MINERALS) tablet, Take 1 tablet by mouth daily., Disp: , Rfl:    nitroGLYCERIN (NITROSTAT) 0.4 MG SL tablet, Place 1 tablet (0.4 mg total) under the tongue every 5 (five) minutes as needed for chest pain., Disp: 30 tablet, Rfl: 0   solifenacin (VESICARE) 10 MG tablet, Take 10 mg by mouth daily., Disp: , Rfl:    tamsulosin (FLOMAX) 0.4 MG CAPS capsule, Take 0.4 mg by mouth daily., Disp: , Rfl:    vitamin C (ASCORBIC ACID) 500 MG tablet, Take 500 mg by mouth., Disp: , Rfl:   Past Medical History: Past Medical  History:  Diagnosis Date   CAD (coronary artery disease)    a. 01/2022 NSTEMI/PCI: LM nl, LAD 13m D1 50, D3 90, LCX 760m95/60d, OM2 80, RCA 100p/m, 8020m0d (3.5x12, 3.0x38, & 3.5x8 Onyx Frontier DES), RPAV1 90 (2.0x12 Onyx Frontier DES), RPAV2 99, RPL1 50, RPL2 99. EF 50-55%; b. 01/2022 Staged PCI: LCX (3.5x18 & 3.0x38 Onyx Frontier DES), OM2 (3.0x22 Onyx Frontier DES).   CKD (chronic kidney disease), stage II    Diabetes mellitus without complication ([facility]  Hyperlipidemia    Hypertension    Ischemic cardiomyopathy    a. 01/2022 Echo: EF 50-55%, mod LVH, GRI DD, mild basal-mid inf/infsept HK. Mildly reduced RV fxn, Mildly dil RA. Mild MR. MIld-mod TR. AoV sclerosis w/o stenosis.    Tobacco Use: Social History   Tobacco Use  Smoking Status Former   Packs/day: 0.20   Years: 50.00   Total pack years: 10.00   Types: Pipe, Cigarettes   Start date: 09/04/1966   Quit date: 02/08/2022   Years since quitting: 0.3  Smokeless Tobacco Never    Labs: Review Flowsheet  More data exists      Latest Ref Rng & Units 05/27/2017 12/26/2017 05/14/2018 02/08/2022 03/13/2022  Labs for ITP Cardiac and Pulmonary Rehab  Cholestrol 0 - 200 mg/dL 187  195  - 181  142   LDL (calc) 0 - 99 mg/dL 125  130  -  110  82   HDL-C >40 mg/dL 40  37  - 48  49   Trlycerides <150 mg/dL 112  158  - 114  53   Hemoglobin A1c 4.8 - 5.6 % - 8.4  7.1  5.9  -     Exercise Target Goals: Exercise Program Goal: Individual exercise prescription set using results from initial 6 min walk test and THRR while considering  patient's activity barriers and safety.   Exercise Prescription Goal: Initial exercise prescription builds to 30-45 minutes a day of aerobic activity, 2-3 days per week.  Home exercise guidelines will be given to patient during program as part of exercise prescription that the participant will acknowledge.   Education: Aerobic Exercise: - Group verbal and visual presentation on the components of exercise  prescription. Introduces F.I.T.T principle from ACSM for exercise prescriptions.  Reviews F.I.T.T. principles of aerobic exercise including progression. Written material given at graduation.   Education: Resistance Exercise: - Group verbal and visual presentation on the components of exercise prescription. Introduces F.I.T.T principle from ACSM for exercise prescriptions  Reviews F.I.T.T. principles of resistance exercise including progression. Written material given at graduation.    Education: Exercise & Equipment Safety: - Individual verbal instruction and demonstration of equipment use and safety with use of the equipment. Flowsheet Row Cardiac Rehab from 03/04/2022 in [facility] Cardiac and Pulmonary Rehab  Date 03/04/22  Educator [facility]  Instruction Review Code 1- Verbalizes Understanding       Education: Exercise Physiology & General Exercise Guidelines: - Group verbal and written instruction with models to review the exercise physiology of the cardiovascular system and associated critical values. Provides general exercise guidelines with specific guidelines to those with heart or lung disease.  Flowsheet Row Cardiac Rehab from 03/04/2022 in [facility] Cardiac and Pulmonary Rehab  Education need identified 03/04/22       Education: Flexibility, Balance, Mind/Body Relaxation: - Group verbal and visual presentation with interactive activity on the components of exercise prescription. Introduces F.I.T.T principle from ACSM for exercise prescriptions. Reviews F.I.T.T. principles of flexibility and balance exercise training including progression. Also discusses the mind body connection.  Reviews various relaxation techniques to help reduce and manage stress (i.e. Deep breathing, progressive muscle relaxation, and visualization). Balance handout provided to take home. Written material given at graduation.   Activity Barriers & Risk Stratification:  Activity Barriers & Cardiac Risk Stratification -  02/18/22 1451       Activity Barriers & Cardiac Risk Stratification   Activity Barriers None    Cardiac Risk Stratification Moderate             6 Minute Walk:  6 Minute Walk     Row Name 03/04/22 1721         6 Minute Walk   Phase Initial     Distance 1215 feet     Walk Time 6 minutes     # of Rest Breaks 0     MPH 2.3     METS 3.45     RPE 11     Perceived Dyspnea  0     VO2 Peak 12.06     Symptoms Yes (comment)     Comments High BP response to 6 MWT (220/100). Patient forgot to take meds today.     Resting HR 63 bpm     Resting BP 150/80     Resting Oxygen Saturation  99 %     Exercise Oxygen Saturation  during 6 min walk 100 %  Max Ex. HR 108 bpm     Max Ex. BP 220/100     2 Minute Post BP 188/80  After further rest patient BP came down to 160/80              Oxygen Initial Assessment:   Oxygen Re-Evaluation:   Oxygen Discharge (Final Oxygen Re-Evaluation):   Initial Exercise Prescription:  Initial Exercise Prescription - 03/04/22 1700       Date of Initial Exercise RX and Referring Provider   Date 03/04/22    Referring Provider Dr. Saunders Revel      Oxygen   Maintain Oxygen Saturation 88% or higher      Treadmill   MPH 108    Grade 0    Minutes 15    METs 2.38      Recumbant Bike   Level 1    RPM 60    Minutes 15    METs 3.45      NuStep   Level 2    SPM 80    Minutes 15    METs 3.45      T5 Nustep   Level 1    SPM 80    Minutes 15    METs 3.45      Track   Laps 25    Minutes 15    METs 2.36      Prescription Details   Frequency (times per week) 2    Duration Progress to 30 minutes of continuous aerobic without signs/symptoms of physical distress      Intensity   THRR 40-80% of Max Heartrate 96-130    Ratings of Perceived Exertion 11-13    Perceived Dyspnea 0-4      Progression   Progression Continue to progress workloads to maintain intensity without signs/symptoms of physical distress.      Resistance Training    Training Prescription No   Patient omitted weights becasue of BP response to walking. Patient forgot take BP meds today. Re-evaluate on first day when patient has taken meds to monotor BP reponse to exercise.            Perform Capillary Blood Glucose checks as needed.  Exercise Prescription Changes:   Exercise Prescription Changes     Row Name 03/04/22 1700 03/20/22 0900 04/01/22 1500 04/15/22 1500 04/24/22 1700     Response to Exercise   Blood Pressure (Admit) -- 154/78 122/60 128/74 --   Blood Pressure (Exercise) -- 160/78 156/76 162/76 --   Blood Pressure (Exit) -- 108/60 118/60 122/72 --   Heart Rate (Admit) 63 bpm 72 bpm 68 bpm 95 bpm --   Heart Rate (Exercise) 108 bpm 103 bpm 115 bpm 110 bpm --   Heart Rate (Exit) 73 bpm 71 bpm 75 bpm 81 bpm --   Oxygen Saturation (Admit) 99 % -- -- -- --   Oxygen Saturation (Exercise) 100 % -- -- -- --   Oxygen Saturation (Exit) 100 % -- -- -- --   Rating of Perceived Exertion (Exercise) _0 --   Perceived Dyspnea (Exercise) 0 -- -- -- --   Symptoms High BP resonse to exercise none none none --   Comments closely monitor BP repsonse to exercise when patient has taken meds 2nd full day of exercise -- -- --   Duration -- Progress to 30 minutes of  aerobic without signs/symptoms of physical distress Progress to 30 minutes of  aerobic without signs/symptoms of physical distress Progress to 30 minutes of  aerobic without signs/symptoms of physical distress --   Intensity -- THRR unchanged THRR unchanged THRR unchanged --     Progression   Progression -- Continue to progress workloads to maintain intensity without signs/symptoms of physical distress. Continue to progress workloads to maintain intensity without signs/symptoms of physical distress. Continue to progress workloads to maintain intensity without signs/symptoms of physical distress. --   Average METs -- 2.4 2.47 2.87 --     Resistance Training   Training Prescription --  Yes  BP has been maintained; patient taking meds Yes Yes --   Weight -- 4 lb 4 lb 6 lb --   Reps -- 10-15 10-15 10-15 --     Interval Training   Interval Training -- No No No --     Treadmill   MPH -- -- 1.8 -- --   Grade -- -- 0 -- --   Minutes -- -- 15 -- --   METs -- -- 1.77 -- --     NuStep   Level -- _0 --   Minutes -- _1 --   METs -- 2.5 2.5 3.9 --     REL-XR   Level -- -- 2 -- --   Minutes -- -- 15 -- --   METs -- -- 3.1 -- --     Track   Laps -- 25 28 32 --   Minutes -- _2 --   METs -- 2.36 2.52 2.74 --     Home Exercise Plan   Plans to continue exercise at -- -- -- -- [facility] (comment)  [facility]   Frequency -- -- -- -- Add 2 additional days to program exercise sessions.   Initial Home Exercises Provided -- -- -- -- 04/24/22     Oxygen   Maintain Oxygen Saturation -- 88% or higher 88% or higher 88% or higher --    Row Name 05/01/22 1200 05/14/22 1500 05/27/22 1500         Response to Exercise   Blood Pressure (Admit) 112/76 124/74 148/80     Blood Pressure (Exercise) 128/74 146/84 --     Blood Pressure (Exit) 118/62 126/70 124/74     Heart Rate (Admit) 77 bpm 78 bpm 74 bpm     Heart Rate (Exercise) 113 bpm 112 bpm 80 bpm     Heart Rate (Exit) 87 bpm 78 bpm 73 bpm     Oxygen Saturation (Admit) -- -- 96 %     Oxygen Saturation (Exercise) -- -- 95 %     Oxygen Saturation (Exit) -- -- 96 %     Rating of Perceived Exertion (Exercise) _3 Symptoms none none none     Duration Progress to 30 minutes of  aerobic without signs/symptoms of physical distress Continue with 30 min of aerobic exercise without signs/symptoms of physical distress. Continue with 30 min of aerobic exercise without signs/symptoms of physical distress.     Intensity THRR unchanged THRR unchanged THRR unchanged       Progression   Progression Continue to progress workloads to maintain intensity without signs/symptoms of physical distress.  Continue to progress workloads to maintain intensity without signs/symptoms of physical distress. Continue to progress workloads to maintain intensity without signs/symptoms of physical distress.     Average METs 3.3 3.21 3.98       Resistance Training   Training Prescription Yes Yes Yes     Weight 7 lb  8 lb 8 lb     Reps 10-15 10-15 10-15       Interval Training   Interval Training No No No       NuStep   Level _0 Minutes _1 METs 4.1 3.2 3.8       REL-XR   Level _2 Minutes _3 METs 2.2 3.6 5.7       Biostep-RELP   Level -- -- 1     Minutes -- -- 15     METs -- -- 3       Track   Laps 66 41 45     Minutes _4 METs 30 3.23 3.45       Home Exercise Plan   Plans to continue exercise at [employer] [facility] [employer] (comment)  [place] (comment)  [facility] (comment)  [facility]     Frequency Add 2 additional days to program exercise sessions. Add 2 additional days to program exercise sessions. Add 2 additional days to program exercise sessions.     Initial Home Exercises Provided 04/24/22 04/24/22 04/24/22       Oxygen   Maintain Oxygen Saturation 88% or higher 88% or higher 88% or higher              Exercise Comments:   Exercise Comments     Row Name 03/13/22 1739           Exercise Comments First full day of exercise!  Patient was oriented to gym and equipment including functions, settings, policies, and procedures.  Patient's individual exercise prescription and treatment plan were reviewed.  All starting workloads were established based on the results of the 6 minute walk test done at initial orientation visit.  The plan for exercise progression was also introduced and progression will be customized based on patient's performance and goals.                Exercise Goals and Review:   Exercise Goals     Row Name 03/04/22 1729              Exercise Goals   Increase Physical Activity Yes       Intervention Provide advice, education, support and counseling about physical activity/exercise needs.;Develop an individualized exercise prescription for aerobic and resistive training based on initial evaluation findings, risk stratification, comorbidities and participant's personal goals.       Expected Outcomes Short Term: Attend rehab on a regular basis to increase amount of physical activity.;Long Term: Add in home exercise to make exercise part of routine and to increase amount of physical activity.;Long Term: Exercising regularly at least 3-5 days a week.       Increase Strength and Stamina Yes       Intervention Provide advice, education, support and counseling about physical activity/exercise needs.;Develop an individualized exercise prescription for aerobic and resistive training based on initial evaluation findings, risk stratification, comorbidities and participant's personal goals.       Expected Outcomes Short Term: Increase workloads from initial exercise prescription for resistance, speed, and METs.;Short Term: Perform resistance training exercises routinely during rehab and add in resistance training at home;Long Term: Improve cardiorespiratory fitness, muscular endurance and strength as measured by increased METs and functional capacity (6MWT)  Able to understand and use rate of perceived exertion (RPE) scale Yes       Intervention Provide education and explanation on how to use RPE scale       Expected Outcomes Short Term: Able to use RPE daily in rehab to express subjective intensity level;Long Term:  Able to use RPE to guide intensity level when exercising independently       Able to understand and use Dyspnea scale Yes       Intervention Provide education and explanation on how to use Dyspnea scale       Expected Outcomes Short Term: Able to use Dyspnea scale daily in rehab to express subjective sense of  shortness of breath during exertion;Long Term: Able to use Dyspnea scale to guide intensity level when exercising independently       Knowledge and understanding of Target Heart Rate Range (THRR) Yes       Intervention Provide education and explanation of THRR including how the numbers were predicted and where they are located for reference       Expected Outcomes Short Term: Able to state/look up THRR;Long Term: Able to use THRR to govern intensity when exercising independently;Short Term: Able to use daily as guideline for intensity in rehab       Able to check pulse independently Yes       Intervention Provide education and demonstration on how to check pulse in carotid and radial arteries.;Review the importance of being able to check your own pulse for safety during independent exercise       Expected Outcomes Short Term: Able to explain why pulse checking is important during independent exercise;Long Term: Able to check pulse independently and accurately       Understanding of Exercise Prescription Yes       Intervention Provide education, explanation, and written materials on patient's individual exercise prescription       Expected Outcomes Short Term: Able to explain program exercise prescription;Long Term: Able to explain home exercise prescription to exercise independently                Exercise Goals Re-Evaluation :  Exercise Goals Re-Evaluation     Row Name 03/13/22 1739 03/20/22 0948 04/01/22 1533 04/15/22 1542 04/24/22 1753     Exercise Goal Re-Evaluation   Exercise Goals Review Able to understand and use rate of perceived exertion (RPE) scale;Able to understand and use Dyspnea scale;Knowledge and understanding of Target Heart Rate Range (THRR);Understanding of Exercise Prescription Understanding of Exercise Prescription;Increase Physical Activity;Increase Strength and Stamina Understanding of Exercise Prescription;Increase Physical Activity;Increase Strength and Stamina  Understanding of Exercise Prescription;Increase Physical Activity;Increase Strength and Stamina Increase Physical Activity;Able to understand and use rate of perceived exertion (RPE) scale;Knowledge and understanding of Target Heart Rate Range (THRR);Understanding of Exercise Prescription;Increase Strength and Stamina;Able to check pulse independently   Comments Reviewed RPE and dyspnea scales, THR and program prescription with pt today.  Pt voiced understanding and was given a copy of goals to take home. Ron is doing well in rehab for the first couple of sessions that he has been here. He had a hypertensive response to exercise during his 6MWT and has had better BP responses during his rehab sessions as he has been taking his medications. He was able to follow his exercise prescription and will continue to monitor while he furthers in the program. Ron continues to do well in rehab. He increased up to 28 laps on the track. He improved to 2.47 average METs  as well. He has also tolerated using 4 lb hand weights for resistance training. We will continue to monitor his progress in the program. Ron continues to do well in rehab. He recently improved to 2.87 average METs. He also increased his laps on the track to 32 laps. He improved to level 3 on the T4 as well. He moved up to 6 lb hand weights for resistance training also. We will continue to monitor his progress in the program. Reviewed home exercise with pt today.  Pt plans to attend [facility] for exercise.  Reviewed THR, pulse, RPE, sign and symptoms, pulse oximetery and when to call 911 or MD.  Also discussed weather considerations and indoor options.  Pt voiced understanding.   Expected Outcomes Short: Use RPE daily to regulate intensity. Long: Follow program prescription in THR. Short: Follow current exercise prescription and continue to stay compliant with BP medications Long: Increase overall strength and stamina Short: Continue to increase  laps on the track. Long: Continue to improve overall MET levels. Short: Continue to increase laps on the track. Long: Continue to improve strength and stamina. Short: start with one extra day of exercise. Long: independently exercise at home with appropriate prescription    Row Name 05/01/22 1211 05/14/22 1529 05/27/22 1552         Exercise Goal Re-Evaluation   Exercise Goals Review Increase Physical Activity;Increase Strength and Stamina;Understanding of Exercise Prescription Increase Physical Activity;Increase Strength and Stamina;Understanding of Exercise Prescription Increase Physical Activity;Increase Strength and Stamina;Understanding of Exercise Prescription     Comments Ron is doing well in rehab. He recently improved his overall average MET level to 3.3 METs. He also got up to 66 laps on the track when walking for the full 30 minutes. He improved to 7 lb hand weights for resistance training as well. We will continue to monitor his progress in the program. Ron continues to do well in rehab. He has increased to level 3 on the XR, level 4 on the T4 Nustep and he is now using 8 lbs for handweights. His laps vary on the track but he did hit 41 laps as one exercise. His BPs have been better overall and RPEs are in appropriate range. We will continue to monitor. Ron is doing well in rehab.  He is now up to 45 laps each day.  He is also up to level 6 on the XR.  We will conitnue to montior his progress.     Expected Outcomes Short: try level 2 on the XR. Long: Continue to increase strength and stamina. Short: Add in more laps on the track Long: Continue to increase overall MET level Short: Continue to increased seated equipment Long: Continue to improve stamina              Discharge Exercise Prescription (Final Exercise Prescription Changes):  Exercise Prescription Changes - 05/27/22 1500       Response to Exercise   Blood Pressure (Admit) 148/80    Blood Pressure (Exit) 124/74    Heart  Rate (Admit) 74 bpm    Heart Rate (Exercise) 80 bpm    Heart Rate (Exit) 73 bpm    Oxygen Saturation (Admit) 96 %    Oxygen Saturation (Exercise) 95 %    Oxygen Saturation (Exit) 96 %    Rating of Perceived Exertion (Exercise) 13    Symptoms none    Duration Continue with 30 min of aerobic exercise without signs/symptoms of physical distress.  Intensity THRR unchanged      Progression   Progression Continue to progress workloads to maintain intensity without signs/symptoms of physical distress.    Average METs 3.98      Resistance Training   Training Prescription Yes    Weight 8 lb    Reps 10-15      Interval Training   Interval Training No      NuStep   Level 4    Minutes 15    METs 3.8      REL-XR   Level 6    Minutes 15    METs 5.7      Biostep-RELP   Level 1    Minutes 15    METs 3      Track   Laps 45    Minutes 15    METs 3.45      Home Exercise Plan   Plans to continue exercise at [employer] (comment)   [facility]   Frequency Add 2 additional days to program exercise sessions.    Initial Home Exercises Provided 04/24/22      Oxygen   Maintain Oxygen Saturation 88% or higher             Nutrition:  Target Goals: Understanding of nutrition guidelines, daily intake of sodium <1561m, cholesterol <2064m calories 30% from fat and 7% or less from saturated fats, daily to have 5 or more servings of fruits and vegetables.  Education: All About Nutrition: -Group instruction provided by verbal, written material, interactive activities, discussions, models, and posters to present general guidelines for heart healthy nutrition including fat, fiber, MyPlate, the role of sodium in heart healthy nutrition, utilization of the nutrition label, and utilization of this knowledge for meal planning. Follow up email sent as well. Written material given at graduation. Flowsheet [facility]  Education need identified 03/04/22       Biometrics:  Pre Biometrics - 03/04/22 1729       Pre Biometrics   Height _0  (1.727 m)    Weight 175 lb 1.6 oz (79.4 kg)    BMI (Calculated) 26.63    Single Leg Stand 24 seconds              Nutrition Therapy Plan and Nutrition Goals:  Nutrition Therapy & Goals - 05/13/22 1752       Nutrition Therapy   Diet Heart healthy, low Na, T2DM MNT    Protein (specify units) 65g    Fiber 30 grams    Whole Grain Foods 3 servings    Saturated Fats 16 max. grams    Fruits and Vegetables 8 servings/day    Sodium 2 grams      Personal Nutrition Goals   Nutrition Goal ST: choose whole wheat bread, limit salt use after cooking, practice MyPlate  LT: include a variety of fruits/vegetables, follow MyPlate guidelines for meals, limit Na <2g/day, make 1/2 grains whole    Comments [age].o. M admitted to cardiac rehab s/p NSTEMI. PMHx includes T2DM (A1C 6), HTN, CAD, HLD, CKD stg II. Relevant medications includes vit C, vit D3, metformin, MVI. PYP Score: 58. Vegetables & Fruits 7/12. Breads, Grains & Cereals 6/12. Red & Processed Meat 4/12. Poultry 0/2. Fish & Shellfish 3/4. Beans, Nuts & Seeds 3/4. Milk & Dairy Foods 3/6. Toppings, Oils, Seasonings & Salt 12/20. Sweets, Snacks & Restaurant Food 11/14. Beverages 9/10. He reports that his wife will cook  breakfast and dinner for him; he reports she is a Lawyer and she will make sure to make his food heart healthy. They use whole grains, lean proteins, no salt with cooking, and uses olive oil and canola oil. He will still use salt after cooking and fry foods with canola oil or air fryer. B: green smoothie with spinach, yogurt, oatmilk, and other vegetables. L: [place] cold-cut sandwich on rye or italian bread. D: wife will make dinner - varies. Disucssed general heart healthy eating and T2DM MNT. Suggested using air fryer more often than frying in oil, use sliced baked chicken more often than deli  meat for sandwiches, choose whole wheat bread, choose seasonings with smaller amounts or no salt added.      Intervention Plan   Intervention Prescribe, educate and counsel regarding individualized specific dietary modifications aiming towards targeted core components such as weight, hypertension, lipid management, diabetes, heart failure and other comorbidities.    Expected Outcomes Short Term Goal: Understand basic principles of dietary content, such as calories, fat, sodium, cholesterol and nutrients.;Short Term Goal: A plan has been developed with personal nutrition goals set during dietitian appointment.;Long Term Goal: Adherence to prescribed nutrition plan.             Nutrition Assessments:  MEDIFICTS Score Key: ?70 Need to make dietary changes  40-70 Heart Healthy Diet ? 40 Therapeutic Level Cholesterol Diet  Flowsheet Row Cardiac Rehab from 03/04/2022 in [facility] Cardiac and Pulmonary Rehab  Picture Your Plate Total Score on Admission 58      Picture Your Plate Scores: <95 Unhealthy dietary pattern with much room for improvement. 41-50 Dietary pattern unlikely to meet recommendations for good health and room for improvement. 51-60 More healthful dietary pattern, with some room for improvement.  >60 Healthy dietary pattern, although there may be some specific behaviors that could be improved.    Nutrition Goals Re-Evaluation:  Nutrition Goals Re-Evaluation     [place] Name 04/04/22 1737 04/24/22 1744           Goals   Current Weight 173 lb (78.5 kg) --      Nutrition Goal Work on foods that he needs to eat. --      Comment He would like to meet the dietitian. Schedule meeting with the dietitian      Expected Outcome Short: meet with dietitian. Long: maintain a diet that he can adhere to. Short: meet with the dietitian; Long: maintain a heart healthy diet.               Nutrition Goals Discharge (Final Nutrition Goals Re-Evaluation):  Nutrition Goals Re-Evaluation -  04/24/22 1744       Goals   Comment Schedule meeting with the dietitian    Expected Outcome Short: meet with the dietitian; Long: maintain a heart healthy diet.             Psychosocial: Target Goals: Acknowledge presence or absence of significant depression and/or stress, maximize coping skills, provide positive support system. Participant is able to verbalize types and ability to use techniques and skills needed for reducing stress and depression.   Education: Stress, Anxiety, and Depression - Group verbal and visual presentation to define topics covered.  Reviews how body is impacted by stress, anxiety, and depression.  Also discusses healthy ways to reduce stress and to treat/manage anxiety and depression.  Written material given at graduation.   Education: Sleep Hygiene -Provides group verbal and written instruction about how sleep can affect your  health.  Define sleep hygiene, discuss sleep cycles and impact of sleep habits. Review good sleep hygiene tips.    Initial Review & Psychosocial Screening:  Initial Psych Review & Screening - 02/18/22 1452       Initial Review   Current issues with None Identified      Family Dynamics   Good Support System? Yes   wife, 20 years,     Barriers   Psychosocial barriers to participate in program There are no identifiable barriers or psychosocial needs.      Screening Interventions   Interventions Encouraged to exercise;To provide support and resources with identified psychosocial needs;Provide feedback about the scores to participant    Expected Outcomes Short Term goal: Utilizing psychosocial counselor, staff and physician to assist with identification of specific Stressors or current issues interfering with healing process. Setting desired goal for each stressor or current issue identified.;Long Term Goal: Stressors or current issues are controlled or eliminated.;Short Term goal: Identification and review with participant of any  Quality of Life or Depression concerns found by scoring the questionnaire.;Long Term goal: The participant improves quality of Life and PHQ9 Scores as seen by post scores and/or verbalization of changes             Quality of Life Scores:   Quality of Life - 03/04/22 1737       Quality of Life   Select Quality of Life      Quality of Life Scores   Health/Function Pre 19.07 %    Socioeconomic Pre 21.88 %    Psych/Spiritual Pre 26.86 %    Family Pre 26.86 %    GLOBAL Post 22.06 %            Scores of 19 and below usually indicate a poorer quality of life in these areas.  A difference of  2-3 points is a clinically meaningful difference.  A difference of 2-3 points in the total score of the Quality of Life Index has been associated with significant improvement in overall quality of life, self-image, physical symptoms, and general health in studies assessing change in quality of life.  PHQ-9: Review Flowsheet  More data may exist      03/04/2022 05/14/2018 01/06/2017 09/04/2016 08/21/2015  Depression screen PHQ 2/9  Decreased Interest 0 0 0 0 0  Down, Depressed, Hopeless 0 0 0 0 0  PHQ - 2 Score 0 0 0 0 0  Altered sleeping 1 0 - - -  Tired, decreased energy 1 0 - - -  Change in appetite 0 0 - - -  Feeling bad or failure about yourself  0 0 - - -  Trouble concentrating 1 0 - - -  Moving slowly or fidgety/restless 0 0 - - -  Suicidal thoughts 0 0 - - -  PHQ-9 Score 3 0 - - -  Difficult doing work/chores Somewhat difficult Not difficult at all - - -   Interpretation of Total Score  Total Score Depression Severity:  1-4 = Minimal depression, 5-9 = Mild depression, 10-14 = Moderate depression, 15-19 = Moderately severe depression, 20-27 = Severe depression   Psychosocial Evaluation and Intervention:  Psychosocial Evaluation - 02/18/22 1501       Psychosocial Evaluation & Interventions   Interventions Encouraged to exercise with the program and follow exercise prescription     Comments Ron has no barriers to attending the program. He lives with his wife and she is his support. He wants to start driving amd  I did advise him to check with his physician.  Ron has  recently quit tobaccouse. He wants to saty quit.   He is ready to get started with the program.    Expected Outcomes STTG Ron attends all scheduled session, he continue to remain without tobacco use. LTG Ron continues with his exercise progression and remain tobacco free.    Continue Psychosocial Services  Follow up required by staff             Psychosocial Re-Evaluation:  Psychosocial Re-Evaluation     [place] Name 04/04/22 6468 04/24/22 1738           Psychosocial Re-Evaluation   Current issues with None Identified --      Comments Patient reports no issues with their current mental states, sleep, stress, depression or anxiety. Will follow up with patient in a few weeks for any changes. Patient commented that his sleep is disturbed 4-5 times a night due to needing to use the bathroom and he usually has a hard time falling back asleep after 2 am. He states this is usual for him and he thinks the early wake up time is because he goes to sleep right after dinner time. He is content with his sleep habits for now and doesn't see a need to address them right now. He also mentioned he is wanting to work on [employer] a heart healthy lifestyle after graduation with exercise and diet but has a hard time with being consistent. He feels like this program is helping getting him into a routine.      Expected Outcomes Short: Continue to exercise regularly to support mental health and notify staff of any changes. Long: maintain mental health and well being through teaching of rehab or prescribed medications independently. Short: continue coming to the program to help develop a routine and stay encouraged. Long: maintain a heart healthy lifestyle independently      Interventions Encouraged to attend Cardiac Rehabilitation  for the exercise Encouraged to attend Cardiac Rehabilitation for the exercise      Continue Psychosocial Services  Follow up required by staff Follow up required by staff               Psychosocial Discharge (Final Psychosocial Re-Evaluation):  Psychosocial Re-Evaluation - 04/24/22 1738       Psychosocial Re-Evaluation   Comments Patient commented that his sleep is disturbed 4-5 times a night due to needing to use the bathroom and he usually has a hard time falling back asleep after 2 am. He states this is usual for him and he thinks the early wake up time is because he goes to sleep right after dinner time. He is content with his sleep habits for now and doesn't see a need to address them right now. He also mentioned he is wanting to work on [employer] a heart healthy lifestyle after graduation with exercise and diet but has a hard time with being consistent. He feels like this program is helping getting him into a routine.    Expected Outcomes Short: continue coming to the program to help develop a routine and stay encouraged. Long: maintain a heart healthy lifestyle independently    Interventions Encouraged to attend Cardiac Rehabilitation for the exercise    Continue Psychosocial Services  Follow up required by staff             Vocational Rehabilitation: Provide vocational rehab assistance to qualifying candidates.   Vocational Rehab Evaluation & Intervention:   Education: Education  Goals: Education classes will be provided on a variety of topics geared toward better understanding of heart health and risk factor modification. Participant will state understanding/return demonstration of topics presented as noted by education test scores.  Learning Barriers/Preferences:   General Cardiac Education Topics:  AED/CPR: - Group verbal and written instruction with the use of models to demonstrate the basic use of the AED with the basic ABC's of resuscitation.   Anatomy and  Cardiac Procedures: - Group verbal and visual presentation and models provide information about basic cardiac anatomy and function. Reviews the testing methods done to diagnose heart disease and the outcomes of the test results. Describes the treatment choices: Medical Management, Angioplasty, or Coronary Bypass Surgery for treating various heart conditions including Myocardial Infarction, Angina, Valve Disease, and Cardiac Arrhythmias.  Written material given at graduation. Flowsheet Row Cardiac Rehab from 03/04/2022 in [facility] Cardiac and Pulmonary Rehab  Education need identified 03/04/22       Medication Safety: - Group verbal and visual instruction to review commonly prescribed medications for heart and lung disease. Reviews the medication, class of the drug, and side effects. Includes the steps to properly store meds and maintain the prescription regimen.  Written material given at graduation.   Intimacy: - Group verbal instruction through game format to discuss how heart and lung disease can affect sexual intimacy. Written material given at graduation..   Know Your Numbers and Heart Failure: - Group verbal and visual instruction to discuss disease risk factors for cardiac and pulmonary disease and treatment options.  Reviews associated critical values for Overweight/Obesity, Hypertension, Cholesterol, and Diabetes.  Discusses basics of heart failure: signs/symptoms and treatments.  Introduces Heart Failure Zone chart for action plan for heart failure.  Written material given at graduation.   Infection Prevention: - Provides verbal and written material to individual with discussion of infection control including proper hand washing and proper equipment cleaning during exercise session. Flowsheet Row Cardiac Rehab from 03/04/2022 in [facility] Cardiac and Pulmonary Rehab  Date 03/04/22  Educator [facility]  Instruction Review Code 1- Verbalizes Understanding       Falls Prevention: - Provides verbal  and written material to individual with discussion of falls prevention and safety. Flowsheet Row Cardiac Rehab from 03/04/2022 in [facility] Cardiac and Pulmonary Rehab  Date 03/04/22  Educator [facility]  Instruction Review Code 1- Verbalizes Understanding       Other: -Provides group and verbal instruction on various topics (see comments)   Knowledge Questionnaire Score:  Knowledge Questionnaire Score - 03/04/22 1737       Knowledge Questionnaire Score   Pre Score 20/26             Core Components/Risk Factors/Patient Goals at Admission:  Personal Goals and Risk Factors at Admission - 03/04/22 1733       Core Components/Risk Factors/Patient Goals on Admission    Weight Management Yes;Weight Gain    Intervention Weight Management: Develop a combined nutrition and exercise program designed to reach desired caloric intake, while maintaining appropriate intake of nutrient and fiber, sodium and fats, and appropriate energy expenditure required for the weight goal.;Weight Management: Provide education and appropriate resources to help participant work on and attain dietary goals.    Admit Weight 175 lb 1.6 oz (79.4 kg)    Goal Weight: Short Term 180 lb (81.6 kg)    Goal Weight: Long Term 180 lb (81.6 kg)    Expected Outcomes Weight Maintenance: Understanding of the daily nutrition guidelines, which includes 25-35% calories from  fat, 7% or less cal from saturated fats, less than 215m cholesterol, less than 1.5gm of sodium, & 5 or more servings of fruits and vegetables daily;Short Term: Continue to assess and modify interventions until short term weight is achieved;Long Term: Adherence to nutrition and physical activity/exercise program aimed toward attainment of established weight goal;Understanding recommendations for meals to include 15-35% energy as protein, 25-35% energy from fat, 35-60% energy from carbohydrates, less than 208mof dietary cholesterol, 20-35 gm of total fiber daily;Weight Gain:  Understanding of general recommendations for a high calorie, high protein meal plan that promotes weight gain by distributing calorie intake throughout the day with the consumption for 4-5 meals, snacks, and/or supplements;Understanding of distribution of calorie intake throughout the day with the consumption of 4-5 meals/snacks    Tobacco Cessation Yes    Number of packs per day RoTads a recent tobacco user. Intervention for tobacco cessation was provided at the initial medical review. He was asked about readiness to quit and reported he quit a few weeks ago . Staff will continue to provide cessation encouragement and follow up with the patient throughout the program.    Intervention Assist the participant in steps to quit. Provide individualized education and counseling about committing to Tobacco Cessation, relapse prevention, and pharmacological support that can be provided by physician.;OfAdvice workerassist with locating and accessing local/national Quit Smoking programs, and support quit date choice.    Expected Outcomes Short Term: Will demonstrate readiness to quit, by selecting a quit date.;Short Term: Will quit all tobacco product use, adhering to prevention of relapse plan.;Long Term: Complete abstinence from all tobacco products for at least 12 months from quit date.    Diabetes Yes    Intervention Provide education about signs/symptoms and action to take for hypo/hyperglycemia.;Provide education about proper nutrition, including hydration, and aerobic/resistive exercise prescription along with prescribed medications to achieve blood glucose in normal ranges: Fasting glucose 65-99 mg/dL    Expected Outcomes Short Term: Participant verbalizes understanding of the signs/symptoms and immediate care of hyper/hypoglycemia, proper foot care and importance of medication, aerobic/resistive exercise and nutrition plan for blood glucose control.;Long Term: Attainment of HbA1C < 7%.     Hypertension Yes    Intervention Provide education on lifestyle modifcations including regular physical activity/exercise, weight management, moderate sodium restriction and increased consumption of fresh fruit, vegetables, and low fat dairy, alcohol moderation, and smoking cessation.;Monitor prescription use compliance.    Expected Outcomes Short Term: Continued assessment and intervention until BP is < 140/9072mG in hypertensive participants. < 130/70m92m in hypertensive participants with diabetes, heart failure or chronic kidney disease.;Long Term: Maintenance of blood pressure at goal levels.    Lipids Yes    Intervention Provide education and support for participant on nutrition & aerobic/resistive exercise along with prescribed medications to achieve LDL <70mg61mL >40mg.22mExpected Outcomes Short Term: Participant states understanding of desired cholesterol values and is compliant with medications prescribed. Participant is following exercise prescription and nutrition guidelines.;Long Term: Cholesterol controlled with medications as prescribed, with individualized exercise RX and with personalized nutrition plan. Value goals: LDL < 70mg, 61m> 40 mg.             Education:Diabetes - Individual verbal and written instruction to review signs/symptoms of diabetes, desired ranges of glucose level fasting, after meals and with exercise. Acknowledge that pre and post exercise glucose checks will be done for 3 sessions at entry of program. Flowsheet Row Cardiac Rehab from 03/04/2022 in  [place] Cardiac and Pulmonary Rehab  Date 03/04/22  Educator [facility]  Instruction Review Code 1- Verbalizes Understanding       Core Components/Risk Factors/Patient Goals Review:   Goals and Risk Factor Review     Row Name 03/04/22 1730 04/04/22 1739 04/24/22 1733         Core Components/Risk Factors/Patient Goals Review   Personal Goals Review Tobacco Cessation Weight Management/Obesity;Diabetes;Tobacco  Cessation Weight Management/Obesity;Diabetes;Tobacco Cessation     Review Trejuan Matherne has recently quit tobacco use within the last 6 months. Intervention for relapse prevention was provided at the initial medical review. He was encouraged to continue to with tobacco cessation and was provided information on relapse prevention. Patient received information about combination therapy, tobacco cessation classes, quit line, and quit smoking apps in case of a relapse. Patient demonstrated understanding of this material.Staff will continue to provide encouragement and follow up with the patient throughout the program. Ron wants to work on his weight. He wants to lose a few pounds. He states that he does not check his sugars at home.He has means to check his sugar at home. Informed him that it would be good to keep his sugars in check. He states that he has been smoking a few cigaretts a day. He states that he is not interested in quitting smoking all together. Ron states that he does not smoke enough to affect him. Ron said he is maintaining his weight, but is still looking to lose a few pounds He is trying to be more active. He is wanting to meet with the dietitian to see what else he can do to help lose weight. He states his daibetes is being managed by his medication, but he is not checking it unless he is symptomatic. His wife is a Marine scientist and helps manage this as well. His blood pressure has been stable and he reports no issues with his medication. He is smoking about 2 cigs a day, which is a decrease from his prior to his MI but he is wanting to quit completely. The cessation packet was given and education provided on quitting tips     Expected Outcomes Short: continue no to smoke Long: maintain tobacco free lifestyle. Short: decrease tobacco use and  monitor blood sugars. Long: maintain blood sugars and tobacco cessation independently. Short: decrease to one cig and meet with dietician. Long: quit tobacco, meet  target weight.              Core Components/Risk Factors/Patient Goals at Discharge (Final Review):   Goals and Risk Factor Review - 04/24/22 1733       Core Components/Risk Factors/Patient Goals Review   Personal Goals Review Weight Management/Obesity;Diabetes;Tobacco Cessation    Review Ron said he is maintaining his weight, but is still looking to lose a few pounds He is trying to be more active. He is wanting to meet with the dietitian to see what else he can do to help lose weight. He states his daibetes is being managed by his medication, but he is not checking it unless he is symptomatic. His wife is a Marine scientist and helps manage this as well. His blood pressure has been stable and he reports no issues with his medication. He is smoking about 2 cigs a day, which is a decrease from his prior to his MI but he is wanting to quit completely. The cessation packet was given and education provided on quitting tips    Expected Outcomes Short: decrease to one cig and  meet with dietician. Long: quit tobacco, meet target weight.             ITP Comments:  ITP Comments     Row Name 02/18/22 1507 03/04/22 1732 03/13/22 1356 03/13/22 1739 04/09/22 1153   ITP Comments Virtual orientation call completed today. he has an appointment on Date: 03/04/2022  for EP eval and gym Orientation.  Documentation of diagnosis can be found in  [facility]  Date: 02/08/2022  [place] is a recent tobacco user. Intervention for tobacco cessation was provided at the initial medical review. He was asked about readiness to quit and reported he quit a few weeks ago . Staff will continue to provide cessation encouragement and follow up with the patient throughout the program. Completed 6MWT and gym orientation. Initial ITP created and sent for review to Dr. Emily Filbert, Medical Director.Ron Bunner has recently quit tobacco use within the last 6 months. Intervention for relapse prevention was provided at the initial medical review. He  was encouraged to continue to with tobacco cessation and was provided information on relapse prevention. Patient received information about combination therapy, tobacco cessation classes, quit line, and quit smoking apps in case of a relapse. Patient demonstrated understanding of this material.Staff will continue to provide encouragement and follow up with the patient throughout the program.  Note was sent to Dr. Saunders Revel reguarding BP parameters during exercise for patient. 30 Day review completed. Medical Director ITP review done, changes made as directed, and signed approval by Medical Director. First full day of exercise!  Patient was oriented to gym and equipment including functions, settings, policies, and procedures.  Patient's individual exercise prescription and treatment plan were reviewed.  All starting workloads were established based on the results of the 6 minute walk test done at initial orientation visit.  The plan for exercise progression was also introduced and progression will be customized based on patient's performance and goals. Patient attendance has been inconsistent.    [place] Name 04/10/22 1036 05/08/22 1001 06/05/22 1352       ITP Comments 30 Day review completed. Medical Director ITP review done, changes made as directed, and signed approval by Medical Director. 30 Day review completed. Medical Director ITP review done, changes made as directed, and signed approval by Medical Director. 30 Day review completed. Medical Director ITP review done, changes made as directed, and signed approval by Medical Director.              Comments:

## 2022-06-06 ENCOUNTER — Encounter: Payer: Medicare Other | Admitting: *Deleted

## 2022-06-06 VITALS — Ht 68.0 in | Wt 175.6 lb

## 2022-06-06 DIAGNOSIS — Z955 Presence of coronary angioplasty implant and graft: Secondary | ICD-10-CM | POA: Diagnosis not present

## 2022-06-06 DIAGNOSIS — I214 Non-ST elevation (NSTEMI) myocardial infarction: Secondary | ICD-10-CM

## 2022-06-06 NOTE — Patient Instructions (Addendum)
Discharge Patient Instructions  Patient Details  Name: Daniel Fuentes MRN: [id number] Date of Birth: 1951-07-05 Referring Provider:  Romualdo Bolk, FNP   Number of Visits: 36  Reason for Discharge:  Patient reached a stable level of exercise. Patient independent in their exercise. Patient has met program and personal goals.  Smoking History:  Social History   Tobacco Use  Smoking Status Former   Packs/day: 0.20   Years: 50.00   Total pack years: 10.00   Types: Pipe, Cigarettes   Start date: 09/04/1966   Quit date: 02/08/2022   Years since quitting: 0.3  Smokeless Tobacco Never    Diagnosis:  NSTEMI (non-ST elevated myocardial infarction) ([place])  Status post coronary artery stent placement  Initial Exercise Prescription:  Initial Exercise Prescription - 03/04/22 1700       Date of Initial Exercise RX and Referring Provider   Date 03/04/22    Referring Provider Dr. Saunders Revel      Oxygen   Maintain Oxygen Saturation 88% or higher      Treadmill   MPH 108    Grade 0    Minutes 15    METs 2.38      Recumbant Bike   Level 1    RPM 60    Minutes 15    METs 3.45      NuStep   Level 2    SPM 80    Minutes 15    METs 3.45      T5 Nustep   Level 1    SPM 80    Minutes 15    METs 3.45      Track   Laps 25    Minutes 15    METs 2.36      Prescription Details   Frequency (times per week) 2    Duration Progress to 30 minutes of continuous aerobic without signs/symptoms of physical distress      Intensity   THRR 40-80% of Max Heartrate 96-130    Ratings of Perceived Exertion 11-13    Perceived Dyspnea 0-4      Progression   Progression Continue to progress workloads to maintain intensity without signs/symptoms of physical distress.      Resistance Training   Training Prescription No   Patient omitted weights becasue of BP response to walking. Patient forgot take BP meds today. Re-evaluate on first day when patient has taken meds to monotor BP reponse to  exercise.            Discharge Exercise Prescription (Final Exercise Prescription Changes):  Exercise Prescription Changes - 05/27/22 1500       Response to Exercise   Blood Pressure (Admit) 148/80    Blood Pressure (Exit) 124/74    Heart Rate (Admit) 74 bpm    Heart Rate (Exercise) 80 bpm    Heart Rate (Exit) 73 bpm    Oxygen Saturation (Admit) 96 %    Oxygen Saturation (Exercise) 95 %    Oxygen Saturation (Exit) 96 %    Rating of Perceived Exertion (Exercise) 13    Symptoms none    Duration Continue with 30 min of aerobic exercise without signs/symptoms of physical distress.    Intensity THRR unchanged      Progression   Progression Continue to progress workloads to maintain intensity without signs/symptoms of physical distress.    Average METs 3.98      Resistance Training   Training Prescription Yes    Weight 8 lb    Reps  10-15      Interval Training   Interval Training No      NuStep   Level 4    Minutes 15    METs 3.8      REL-XR   Level 6    Minutes 15    METs 5.7      Biostep-RELP   Level 1    Minutes 15    METs 3      Track   Laps 45    Minutes 15    METs 3.45      Home Exercise Plan   Plans to continue exercise at [employer] (comment)   [facility]   Frequency Add 2 additional days to program exercise sessions.    Initial Home Exercises Provided 04/24/22      Oxygen   Maintain Oxygen Saturation 88% or higher             Functional Capacity:  6 Minute Walk     Row Name 03/04/22 1721 06/06/22 1737       6 Minute Walk   Phase Initial Discharge    Distance 1215 feet 1405 feet    Distance % Change -- 15.6 %    Distance Feet Change -- 190 ft    Walk Time 6 minutes 6 minutes    # of Rest Breaks 0 0    MPH 2.3 2.66    METS 3.45 3.26    RPE 11 12    Perceived Dyspnea  0 0    VO2 Peak 12.06 11.43    Symptoms Yes (comment) Yes (comment)    Comments High BP response to 6 MWT (220/100). Patient forgot to  take meds today. "Burning" calves    Resting HR 63 bpm 78 bpm    Resting BP 150/80 122/60    Resting Oxygen Saturation  99 % 98 %    Exercise Oxygen Saturation  during 6 min walk 100 % 97 %    Max Ex. HR 108 bpm 110 bpm    Max Ex. BP 220/100 154/80    2 Minute Post BP 188/80  After further rest patient BP came down to 160/80 --            Nutrition & Weight - Outcomes:  Pre Biometrics - 03/04/22 1729       Pre Biometrics   Height '5\' 8"'  (1.727 m)    Weight 175 lb 1.6 oz (79.4 kg)    BMI (Calculated) 26.63    Single Leg Stand 24 seconds             Post Biometrics - 06/06/22 1734        Post  Biometrics   Height '5\' 8"'  (1.727 m)    Weight 175 lb 9.6 oz (79.7 kg)    BMI (Calculated) 26.71             Nutrition:  Nutrition Therapy & Goals - 05/13/22 1752       Nutrition Therapy   Diet Heart healthy, low Na, T2DM MNT    Protein (specify units) 65g    Fiber 30 grams    Whole Grain Foods 3 servings    Saturated Fats 16 max. grams    Fruits and Vegetables 8 servings/day    Sodium 2 grams      Personal Nutrition Goals   Nutrition Goal ST: choose whole wheat bread, limit salt use after cooking, practice MyPlate  LT: include a variety of fruits/vegetables, follow  MyPlate guidelines for meals, limit Na <2g/day, make 1/2 grains whole    Comments 71 y.o. M admitted to cardiac rehab s/p NSTEMI. PMHx includes T2DM (A1C 6), HTN, CAD, HLD, CKD stg II. Relevant medications includes vit C, vit D3, metformin, MVI. PYP Score: 58. Vegetables & Fruits 7/12. Breads, Grains & Cereals 6/12. Red & Processed Meat 4/12. Poultry 0/2. Fish & Shellfish 3/4. Beans, Nuts & Seeds 3/4. Milk & Dairy Foods 3/6. Toppings, Oils, Seasonings & Salt 12/20. Sweets, Snacks & Restaurant Food 11/14. Beverages 9/10. He reports that his wife will cook breakfast and dinner for him; he reports she is a Lawyer and she will make sure to make his food heart healthy. They use whole grains, lean proteins, no  salt with cooking, and uses olive oil and canola oil. He will still use salt after cooking and fry foods with canola oil or air fryer. B: green smoothie with spinach, yogurt, oatmilk, and other vegetables. L: [place] cold-cut sandwich on rye or italian bread. D: wife will make dinner - varies. Disucssed general heart healthy eating and T2DM MNT. Suggested using air fryer more often than frying in oil, use sliced baked chicken more often than deli meat for sandwiches, choose whole wheat bread, choose seasonings with smaller amounts or no salt added.      Intervention Plan   Intervention Prescribe, educate and counsel regarding individualized specific dietary modifications aiming towards targeted core components such as weight, hypertension, lipid management, diabetes, heart failure and other comorbidities.    Expected Outcomes Short Term Goal: Understand basic principles of dietary content, such as calories, fat, sodium, cholesterol and nutrients.;Short Term Goal: A plan has been developed with personal nutrition goals set during dietitian appointment.;Long Term Goal: Adherence to prescribed nutrition plan.             Goals reviewed with patient; copy given to patient.

## 2022-06-06 NOTE — Progress Notes (Signed)
Daily Session Note  Patient Details  Name: Daniel Fuentes MRN: [id number] Date of Birth: Jul 12, 1951 Referring Provider:   Flowsheet Row Cardiac Rehab from 03/04/2022 in [facility] Cardiac and Pulmonary Rehab  Referring Provider Dr. Saunders Revel       Encounter Date: 06/06/2022  Check In:  Session Check In - 06/06/22 1710       Check-In   Supervising physician immediately available to respond to emergencies See telemetry face sheet for immediately available ER MD    Location [facility]-Cardiac & Pulmonary Rehab    Staff Present Renita Papa, RN BSN;Joseph Adelino, RCP,RRT,BSRT;Kara Guttenberg, [place], ASCM CEP, Exercise Physiologist    Virtual Visit No    Medication changes reported     No    Fall or balance concerns reported    No    Tobacco Cessation No Change    Current number of cigarettes/nicotine per day     3    Warm-up and Cool-down Performed on first and last piece of equipment    Resistance Training Performed Yes    VAD Patient? No    PAD/SET Patient? No      Pain Assessment   Currently in Pain? No/denies                Social History   Tobacco Use  Smoking Status Former   Packs/day: 0.20   Years: 50.00   Total pack years: 10.00   Types: Pipe, Cigarettes   Start date: 09/04/1966   Quit date: 02/08/2022   Years since quitting: 0.3  Smokeless Tobacco Never    Goals Met:  Independence with exercise equipment Exercise tolerated well No report of concerns or symptoms today Strength training completed today  Goals Unmet:  Not Applicable  Comments: Pt able to follow exercise prescription today without complaint.  Will continue to monitor for progression.    Dr. Emily Filbert is Medical Director for [place].  Dr. Ottie Glazier is Medical Director for [facility] Pulmonary Rehabilitation.

## 2022-06-10 ENCOUNTER — Encounter: Payer: Medicare Other | Admitting: *Deleted

## 2022-06-10 DIAGNOSIS — Z955 Presence of coronary angioplasty implant and graft: Secondary | ICD-10-CM

## 2022-06-10 DIAGNOSIS — I214 Non-ST elevation (NSTEMI) myocardial infarction: Secondary | ICD-10-CM

## 2022-06-10 NOTE — Progress Notes (Signed)
Daily Session Note  Patient Details  Name: Daniel Fuentes MRN: [id number] Date of Birth: 11/27/1950 Referring Provider:   Flowsheet Row Cardiac Rehab from 03/04/2022 in [facility] Cardiac and Pulmonary Rehab  Referring Provider Dr. Saunders Revel       Encounter Date: 06/10/2022  Check In:  Session Check In - 06/10/22 1706       Check-In   Supervising physician immediately available to respond to emergencies See telemetry face sheet for immediately available ER MD    Location [facility]-Cardiac & Pulmonary Rehab    Staff Present Renita Papa, RN BSN;Joseph [place], RCP,RRT,BSRT;Noah [place], [place], Exercise Physiologist    Virtual Visit No    Medication changes reported     No    Fall or balance concerns reported    No    Tobacco Cessation No Change    Current number of cigarettes/nicotine per day     3    Warm-up and Cool-down Performed on first and last piece of equipment    Resistance Training Performed Yes    VAD Patient? No    PAD/SET Patient? No      Pain Assessment   Currently in Pain? No/denies                Social History   Tobacco Use  Smoking Status Former   Packs/day: 0.20   Years: 50.00   Total pack years: 10.00   Types: Pipe, Cigarettes   Start date: 09/04/1966   Quit date: 02/08/2022   Years since quitting: 0.3  Smokeless Tobacco Never    Goals Met:  Independence with exercise equipment Exercise tolerated well No report of concerns or symptoms today Strength training completed today  Goals Unmet:  Not Applicable  Comments: Pt able to follow exercise prescription today without complaint.  Will continue to monitor for progression.    Dr. Emily Filbert is Medical Director for [place].  Dr. Ottie Glazier is Medical Director for [facility] Pulmonary Rehabilitation.

## 2022-06-12 ENCOUNTER — Encounter: Payer: Medicare Other | Admitting: *Deleted

## 2022-06-12 DIAGNOSIS — Z955 Presence of coronary angioplasty implant and graft: Secondary | ICD-10-CM

## 2022-06-12 DIAGNOSIS — I214 Non-ST elevation (NSTEMI) myocardial infarction: Secondary | ICD-10-CM

## 2022-06-12 NOTE — Progress Notes (Signed)
Daily Session Note  Patient Details  Name: Daniel Fuentes MRN: [id number] Date of Birth: 13-Jun-1951 Referring Provider:   Flowsheet Row Cardiac Rehab from 03/04/2022 in [facility] Cardiac and Pulmonary Rehab  Referring Provider Dr. Saunders Revel       Encounter Date: 06/12/2022  Check In:  Session Check In - 06/12/22 1738       Check-In   Supervising physician immediately available to respond to emergencies See telemetry face sheet for immediately available ER MD    Location [facility]-Cardiac & Pulmonary Rehab    Staff Present Renita Papa, RN BSN;Joseph [place], [place];Heath Lark, RN, BSN, CCRP    Virtual Visit No    Medication changes reported     No    Fall or balance concerns reported    No    Tobacco Cessation No Change    Current number of cigarettes/nicotine per day     3    Warm-up and Cool-down Performed on first and last piece of equipment    Resistance Training Performed Yes    VAD Patient? No    PAD/SET Patient? No      Pain Assessment   Currently in Pain? No/denies                Social History   Tobacco Use  Smoking Status Former   Packs/day: 0.20   Years: 50.00   Total pack years: 10.00   Types: Pipe, Cigarettes   Start date: 09/04/1966   Quit date: 02/08/2022   Years since quitting: 0.3  Smokeless Tobacco Never    Goals Met:  Independence with exercise equipment Exercise tolerated well No report of concerns or symptoms today Strength training completed today  Goals Unmet:  Not Applicable  Comments: Pt able to follow exercise prescription today without complaint.  Will continue to monitor for progression.    Dr. Emily Filbert is Medical Director for [place].  Dr. Ottie Glazier is Medical Director for [facility] Pulmonary Rehabilitation.

## 2022-06-13 ENCOUNTER — Encounter: Payer: Medicare Other | Admitting: *Deleted

## 2022-06-13 DIAGNOSIS — Z955 Presence of coronary angioplasty implant and graft: Secondary | ICD-10-CM | POA: Diagnosis not present

## 2022-06-13 DIAGNOSIS — I214 Non-ST elevation (NSTEMI) myocardial infarction: Secondary | ICD-10-CM

## 2022-06-13 NOTE — Progress Notes (Signed)
Daily Session Note  Patient Details  Name: Daniel Fuentes MRN: [id number] Date of Birth: 1951/07/02 Referring Provider:   Flowsheet Row Cardiac Rehab from 03/04/2022 in [facility] Cardiac and Pulmonary Rehab  Referring Provider Dr. Saunders Revel       Encounter Date: 06/13/2022  Check In:  Session Check In - 06/13/22 1717       Check-In   Supervising physician immediately available to respond to emergencies See telemetry face sheet for immediately available ER MD    Location [facility]-Cardiac & Pulmonary Rehab    Staff Present Renita Papa, RN BSN;Joseph [place], RCP,RRT,BSRT;Noah [place], [place], Exercise Physiologist    Virtual Visit No    Medication changes reported     No    Fall or balance concerns reported    No    Tobacco Cessation No Change    Current number of cigarettes/nicotine per day     3    Warm-up and Cool-down Performed on first and last piece of equipment    Resistance Training Performed Yes    VAD Patient? No    PAD/SET Patient? No      Pain Assessment   Currently in Pain? No/denies                Social History   Tobacco Use  Smoking Status Former   Packs/day: 0.20   Years: 50.00   Total pack years: 10.00   Types: Pipe, Cigarettes   Start date: 09/04/1966   Quit date: 02/08/2022   Years since quitting: 0.3  Smokeless Tobacco Never    Goals Met:  Independence with exercise equipment Exercise tolerated well No report of concerns or symptoms today Strength training completed today  Goals Unmet:  Not Applicable  Comments: Pt able to follow exercise prescription today without complaint.  Will continue to monitor for progression.    Dr. Emily Filbert is Medical Director for [place].  Dr. Ottie Glazier is Medical Director for [facility] Pulmonary Rehabilitation.

## 2022-06-17 ENCOUNTER — Encounter: Payer: Medicare Other | Admitting: *Deleted

## 2022-06-17 DIAGNOSIS — I214 Non-ST elevation (NSTEMI) myocardial infarction: Secondary | ICD-10-CM

## 2022-06-17 DIAGNOSIS — Z955 Presence of coronary angioplasty implant and graft: Secondary | ICD-10-CM

## 2022-06-17 NOTE — Progress Notes (Signed)
Daily Session Note  Patient Details  Name: Daniel Fuentes MRN: [id number] Date of Birth: 02-Nov-1950 Referring Provider:   Flowsheet Row Cardiac Rehab from 03/04/2022 in [facility] Cardiac and Pulmonary Rehab  Referring Provider Dr. Saunders Revel       Encounter Date: 06/17/2022  Check In:  Session Check In - 06/17/22 1716       Check-In   Supervising physician immediately available to respond to emergencies See telemetry face sheet for immediately available ER MD    Location [facility]-Cardiac & Pulmonary Rehab    Staff Present Renita Papa, RN BSN;Joseph [place], RCP,RRT,BSRT;Noah [place], [place], Exercise Physiologist    Virtual Visit No    Medication changes reported     No    Fall or balance concerns reported    No    Tobacco Cessation No Change    Current number of cigarettes/nicotine per day     3    Warm-up and Cool-down Performed on first and last piece of equipment    Resistance Training Performed Yes    VAD Patient? No    PAD/SET Patient? No      Pain Assessment   Currently in Pain? No/denies                Social History   Tobacco Use  Smoking Status Former   Packs/day: 0.20   Years: 50.00   Total pack years: 10.00   Types: Pipe, Cigarettes   Start date: 09/04/1966   Quit date: 02/08/2022   Years since quitting: 0.3  Smokeless Tobacco Never    Goals Met:  Independence with exercise equipment Exercise tolerated well No report of concerns or symptoms today Strength training completed today  Goals Unmet:  Not Applicable  Comments: Pt able to follow exercise prescription today without complaint.  Will continue to monitor for progression.    Dr. Emily Filbert is Medical Director for [place].  Dr. Ottie Glazier is Medical Director for [facility] Pulmonary Rehabilitation.

## 2022-06-18 ENCOUNTER — Other Ambulatory Visit: Payer: Self-pay | Admitting: Internal Medicine

## 2022-06-18 DIAGNOSIS — I771 Stricture of artery: Secondary | ICD-10-CM

## 2022-06-19 ENCOUNTER — Encounter: Payer: Medicare Other | Admitting: *Deleted

## 2022-06-19 ENCOUNTER — Encounter: Payer: Self-pay | Admitting: *Deleted

## 2022-06-19 DIAGNOSIS — Z955 Presence of coronary angioplasty implant and graft: Secondary | ICD-10-CM

## 2022-06-19 DIAGNOSIS — I214 Non-ST elevation (NSTEMI) myocardial infarction: Secondary | ICD-10-CM

## 2022-06-19 NOTE — Progress Notes (Signed)
Cardiac Individual Treatment Plan  Patient Details  Name: Daniel Fuentes MRN: [id number] Date of Birth: November 23, 1950 Referring Provider:   Flowsheet Row Cardiac Rehab from 03/04/2022 in [facility] Cardiac and Pulmonary Rehab  Referring Provider Dr. Saunders Revel       Initial Encounter Date:  Flowsheet Row Cardiac Rehab from 03/04/2022 in [facility] Cardiac and Pulmonary Rehab  Date 03/04/22       Visit Diagnosis: NSTEMI (non-ST elevated myocardial infarction) [facility])  Status post coronary artery stent placement  Patient's Home Medications on Admission:  Current Outpatient Medications:    amLODipine (NORVASC) 10 MG tablet, Take 1 tablet (10 mg total) by mouth daily., Disp: 90 tablet, Rfl: 3   aspirin EC 81 MG tablet, Take 1 tablet (81 mg total) by mouth daily., Disp: 30 tablet, Rfl: 0   carvedilol (COREG) 6.25 MG tablet, Take 1 tablet (6.25 mg total) by mouth 2 (two) times daily with a meal., Disp: 180 tablet, Rfl: 3   Cholecalciferol (VITAMIN D3) 2000 units capsule, Take 2,000 Units by mouth daily., Disp: , Rfl:    Evolocumab (REPATHA SURECLICK) 409 MG/ML SOAJ, Inject 140 mg into the skin every 14 (fourteen) days., Disp: 2 mL, Rfl: 11   losartan (COZAAR) 25 MG tablet, Take 1 tablet (25 mg total) by mouth daily., Disp: 90 tablet, Rfl: 3   metFORMIN (GLUCOPHAGE) 1000 MG tablet, Take 1 tablet (1,000 mg total) by mouth daily. Start tomorrow, Disp: , Rfl:    Multiple Vitamins-Minerals (MULTIVITAMIN WITH MINERALS) tablet, Take 1 tablet by mouth daily., Disp: , Rfl:    nitroGLYCERIN (NITROSTAT) 0.4 MG SL tablet, Place 1 tablet (0.4 mg total) under the tongue every 5 (five) minutes as needed for chest pain., Disp: 30 tablet, Rfl: 0   solifenacin (VESICARE) 10 MG tablet, Take 10 mg by mouth daily., Disp: , Rfl:    tamsulosin (FLOMAX) 0.4 MG CAPS capsule, Take 0.4 mg by mouth daily., Disp: , Rfl:    vitamin C (ASCORBIC ACID) 500 MG tablet, Take 500 mg by mouth., Disp: , Rfl:   Past Medical History: Past Medical  History:  Diagnosis Date   CAD (coronary artery disease)    a. 01/2022 NSTEMI/PCI: LM nl, LAD 13m D1 50, D3 90, LCX 760m95/60d, OM2 80, RCA 100p/m, 8020m0d (3.5x12, 3.0x38, & 3.5x8 Onyx Frontier DES), RPAV1 90 (2.0x12 Onyx Frontier DES), RPAV2 99, RPL1 50, RPL2 99. EF 50-55%; b. 01/2022 Staged PCI: LCX (3.5x18 & 3.0x38 Onyx Frontier DES), OM2 (3.0x22 Onyx Frontier DES).   CKD (chronic kidney disease), stage II    Diabetes mellitus without complication ([facility]CC[facility]  Hyperlipidemia    Hypertension    Ischemic cardiomyopathy    a. 01/2022 Echo: EF 50-55%, mod LVH, GRI DD, mild basal-mid inf/infsept HK. Mildly reduced RV fxn, Mildly dil RA. Mild MR. MIld-mod TR. AoV sclerosis w/o stenosis.    Tobacco Use: Social History   Tobacco Use  Smoking Status Former   Packs/day: 0.20   Years: 50.00   Total pack years: 10.00   Types: Pipe, Cigarettes   Start date: 09/04/1966   Quit date: 02/08/2022   Years since quitting: 0.3  Smokeless Tobacco Never    Labs: Review Flowsheet  More data exists      Latest Ref Rng & Units 05/27/2017 12/26/2017 05/14/2018 02/08/2022 03/13/2022  Labs for ITP Cardiac and Pulmonary Rehab  Cholestrol 0 - 200 mg/dL 187  195  - 181  142   LDL (calc) 0 - 99 mg/dL 125  130  -  110  82   HDL-C >40 mg/dL 40  37  - 48  49   Trlycerides <150 mg/dL 112  158  - 114  53   Hemoglobin A1c 4.8 - 5.6 % - 8.4  7.1  5.9  -     Exercise Target Goals: Exercise Program Goal: Individual exercise prescription set using results from initial 6 min walk test and THRR while considering  patient's activity barriers and safety.   Exercise Prescription Goal: Initial exercise prescription builds to 30-45 minutes a day of aerobic activity, 2-3 days per week.  Home exercise guidelines will be given to patient during program as part of exercise prescription that the participant will acknowledge.   Education: Aerobic Exercise: - Group verbal and visual presentation on the components of exercise  prescription. Introduces F.I.T.T principle from ACSM for exercise prescriptions.  Reviews F.I.T.T. principles of aerobic exercise including progression. Written material given at graduation.   Education: Resistance Exercise: - Group verbal and visual presentation on the components of exercise prescription. Introduces F.I.T.T principle from ACSM for exercise prescriptions  Reviews F.I.T.T. principles of resistance exercise including progression. Written material given at graduation. Flowsheet Row Cardiac Rehab from 06/12/2022 in [facility] Cardiac and Pulmonary Rehab  Date 06/12/22  Educator SB  Instruction Review Code 1- [employer] Understanding        Education: Exercise & Equipment Safety: - Individual verbal instruction and demonstration of equipment use and safety with use of the equipment. Flowsheet Row Cardiac Rehab from 06/12/2022 in [facility] Cardiac and Pulmonary Rehab  Date 03/04/22  Educator [facility]  Instruction Review Code 1- Verbalizes Understanding       Education: Exercise Physiology & General Exercise Guidelines: - Group verbal and written instruction with models to review the exercise physiology of the cardiovascular system and associated critical values. Provides general exercise guidelines with specific guidelines to those with heart or lung disease.  Flowsheet Row Cardiac Rehab from 06/12/2022 in [facility] Cardiac and Pulmonary Rehab  Education need identified 03/04/22       Education: Flexibility, Balance, Mind/Body Relaxation: - Group verbal and visual presentation with interactive activity on the components of exercise prescription. Introduces F.I.T.T principle from ACSM for exercise prescriptions. Reviews F.I.T.T. principles of flexibility and balance exercise training including progression. Also discusses the mind body connection.  Reviews various relaxation techniques to help reduce and manage stress (i.e. Deep breathing, progressive muscle relaxation, and visualization). Balance  handout provided to take home. Written material given at graduation. Flowsheet Row Cardiac Rehab from 06/12/2022 in [facility] Cardiac and Pulmonary Rehab  Date 06/12/22  Educator SB  Instruction Review Code 1- Verbalizes Understanding       Activity Barriers & Risk Stratification:  Activity Barriers & Cardiac Risk Stratification - 02/18/22 1451       Activity Barriers & Cardiac Risk Stratification   Activity Barriers None    Cardiac Risk Stratification Moderate             6 Minute Walk:  6 Minute Walk     Row Name 03/04/22 1721 06/06/22 1737       6 Minute Walk   Phase Initial Discharge    Distance 1215 feet 1405 feet    Distance % Change -- 15.6 %    Distance Feet Change -- 190 ft    Walk Time 6 minutes 6 minutes    # of Rest Breaks 0 0    MPH 2.3 2.66    METS 3.45 3.26    RPE 11 12  Perceived Dyspnea  0 0    VO2 Peak 12.06 11.43    Symptoms Yes (comment) Yes (comment)    Comments High BP response to 6 MWT (220/100). Patient forgot to take meds today. "Burning" calves    Resting HR 63 bpm 78 bpm    Resting BP 150/80 122/60    Resting Oxygen Saturation  99 % 98 %    Exercise Oxygen Saturation  during 6 min walk 100 % 97 %    Max Ex. HR 108 bpm 110 bpm    Max Ex. BP 220/100 154/80    2 Minute Post BP 188/80  After further rest patient BP came down to 160/80 --             Oxygen Initial Assessment:   Oxygen Re-Evaluation:   Oxygen Discharge (Final Oxygen Re-Evaluation):   Initial Exercise Prescription:  Initial Exercise Prescription - 03/04/22 1700       Date of Initial Exercise RX and Referring Provider   Date 03/04/22    Referring Provider Dr. Saunders Revel      Oxygen   Maintain Oxygen Saturation 88% or higher      Treadmill   MPH 108    Grade 0    Minutes 15    METs 2.38      Recumbant Bike   Level 1    RPM 60    Minutes 15    METs 3.45      NuStep   Level 2    SPM 80    Minutes 15    METs 3.45      T5 Nustep   Level 1    SPM 80     Minutes 15    METs 3.45      Track   Laps 25    Minutes 15    METs 2.36      Prescription Details   Frequency (times per week) 2    Duration Progress to 30 minutes of continuous aerobic without signs/symptoms of physical distress      Intensity   THRR 40-80% of Max Heartrate 96-130    Ratings of Perceived Exertion 11-13    Perceived Dyspnea 0-4      Progression   Progression Continue to progress workloads to maintain intensity without signs/symptoms of physical distress.      Resistance Training   Training Prescription No   Patient omitted weights becasue of BP response to walking. Patient forgot take BP meds today. Re-evaluate on first day when patient has taken meds to monotor BP reponse to exercise.            Perform Capillary Blood Glucose checks as needed.  Exercise Prescription Changes:   Exercise Prescription Changes     Row Name 03/04/22 1700 03/20/22 0900 04/01/22 1500 04/15/22 1500 04/24/22 1700     Response to Exercise   Blood Pressure (Admit) -- 154/78 122/60 128/74 --   Blood Pressure (Exercise) -- 160/78 156/76 162/76 --   Blood Pressure (Exit) -- 108/60 118/60 122/72 --   Heart Rate (Admit) 63 bpm 72 bpm 68 bpm 95 bpm --   Heart Rate (Exercise) 108 bpm 103 bpm 115 bpm 110 bpm --   Heart Rate (Exit) 73 bpm 71 bpm 75 bpm 81 bpm --   Oxygen Saturation (Admit) 99 % -- -- -- --   Oxygen Saturation (Exercise) 100 % -- -- -- --   Oxygen Saturation (Exit) 100 % -- -- -- --   Rating of  Perceived Exertion (Exercise) 11 13 13 14  --   Perceived Dyspnea (Exercise) 0 -- -- -- --   Symptoms High BP resonse to exercise none none none --   Comments closely monitor BP repsonse to exercise when patient has taken meds 2nd full day of exercise -- -- --   Duration -- Progress to 30 minutes of  aerobic without signs/symptoms of physical distress Progress to 30 minutes of  aerobic without signs/symptoms of physical distress Progress to 30 minutes of  aerobic without  signs/symptoms of physical distress --   Intensity -- THRR unchanged THRR unchanged THRR unchanged --     Progression   Progression -- Continue to progress workloads to maintain intensity without signs/symptoms of physical distress. Continue to progress workloads to maintain intensity without signs/symptoms of physical distress. Continue to progress workloads to maintain intensity without signs/symptoms of physical distress. --   Average METs -- 2.4 2.47 2.87 --     Resistance Training   Training Prescription -- Yes  BP has been maintained; patient taking meds Yes Yes --   Weight -- 4 lb 4 lb 6 lb --   Reps -- 10-15 10-15 10-15 --     Interval Training   Interval Training -- No No No --     Treadmill   MPH -- -- 1.8 -- --   Grade -- -- 0 -- --   Minutes -- -- 15 -- --   METs -- -- 1.77 -- --     NuStep   Level -- 2 2 3  --   Minutes -- 15 15 15  --   METs -- 2.5 2.5 3.9 --     REL-XR   Level -- -- 2 -- --   Minutes -- -- 15 -- --   METs -- -- 3.1 -- --     Track   Laps -- 25 28 32 --   Minutes -- 15 15 15  --   METs -- 2.36 2.52 2.74 --     Home Exercise Plan   Plans to continue exercise at -- -- -- -- [facility] (comment)  [facility]   Frequency -- -- -- -- Add 2 additional days to program exercise sessions.   Initial Home Exercises Provided -- -- -- -- 04/24/22     Oxygen   Maintain Oxygen Saturation -- 88% or higher 88% or higher 88% or higher --    Row Name 05/01/22 1200 05/14/22 1500 05/27/22 1500 06/12/22 1300       Response to Exercise   Blood Pressure (Admit) 112/76 124/74 148/80 122/60    Blood Pressure (Exercise) 128/74 146/84 -- --    Blood Pressure (Exit) 118/62 126/70 124/74 128/72    Heart Rate (Admit) 77 bpm 78 bpm 74 bpm 78 bpm    Heart Rate (Exercise) 113 bpm 112 bpm 80 bpm 110 bpm    Heart Rate (Exit) 87 bpm 78 bpm 73 bpm 80 bpm    Oxygen Saturation (Admit) -- -- 96 % 97 %    Oxygen Saturation (Exercise) -- -- 95 % 93 %     Oxygen Saturation (Exit) -- -- 96 % 95 %    Rating of Perceived Exertion (Exercise) 14 13 13 12     Symptoms none none none none    Duration Progress to 30 minutes of  aerobic without signs/symptoms of physical distress Continue with 30 min of aerobic exercise without signs/symptoms of physical distress. Continue with 30 min of aerobic exercise without  signs/symptoms of physical distress. Continue with 30 min of aerobic exercise without signs/symptoms of physical distress.    Intensity THRR unchanged THRR unchanged THRR unchanged THRR unchanged      Progression   Progression Continue to progress workloads to maintain intensity without signs/symptoms of physical distress. Continue to progress workloads to maintain intensity without signs/symptoms of physical distress. Continue to progress workloads to maintain intensity without signs/symptoms of physical distress. Continue to progress workloads to maintain intensity without signs/symptoms of physical distress.    Average METs 3.3 3.21 3.98 3.51      Resistance Training   Training Prescription Yes Yes Yes Yes    Weight 7 lb 8 lb 8 lb 8 lb    Reps 10-15 10-15 10-15 10-15      Interval Training   Interval Training No No No No      NuStep   Level $Remo'3 4 4 4    'bZZJX$ Minutes $Rem'15 15 15 15    'qbcd$ METs 4.1 3.2 3.8 4      REL-XR   Level $Remo'1 3 6 6    'fnYeQ$ Minutes $Rem'15 15 15 15    'AoIT$ METs 2.2 3.6 5.7 4.4      Biostep-RELP   Level -- -- 1 --    Minutes -- -- 15 --    METs -- -- 3 --      Track   Laps 66 41 45 44    Minutes $Remove'15 15 15 15    'JkbSvKy$ METs 30 3.23 3.45 3.39      Home Exercise Plan   Plans to continue exercise at [employer] (comment)  [place] (comment)  [place] (comment)  [place] (comment)  [facility]    Frequency Add 2 additional days to program exercise sessions. Add 2 additional days to program exercise sessions. Add 2  additional days to program exercise sessions. Add 2 additional days to program exercise sessions.    Initial Home Exercises Provided 04/24/22 04/24/22 04/24/22 04/24/22      Oxygen   Maintain Oxygen Saturation 88% or higher 88% or higher 88% or higher 88% or higher             Exercise Comments:   Exercise Comments     Row Name 03/13/22 1739           Exercise Comments First full day of exercise!  Patient was oriented to gym and equipment including functions, settings, policies, and procedures.  Patient's individual exercise prescription and treatment plan were reviewed.  All starting workloads were established based on the results of the 6 minute walk test done at initial orientation visit.  The plan for exercise progression was also introduced and progression will be customized based on patient's performance and goals.                Exercise Goals and Review:   Exercise Goals     Row Name 03/04/22 1729             Exercise Goals   Increase Physical Activity Yes       Intervention Provide advice, education, support and counseling about physical activity/exercise needs.;Develop an individualized exercise prescription for aerobic and resistive training based on initial evaluation findings, risk stratification, comorbidities and participant's personal goals.       Expected Outcomes Short Term: Attend rehab on a regular basis to increase amount of physical activity.;Long Term: Add in  home exercise to make exercise part of routine and to increase amount of physical activity.;Long Term: Exercising regularly at least 3-5 days a week.       Increase Strength and Stamina Yes       Intervention Provide advice, education, support and counseling about physical activity/exercise needs.;Develop an individualized exercise prescription for aerobic and resistive training based on initial evaluation findings, risk stratification, comorbidities and participant's personal goals.        Expected Outcomes Short Term: Increase workloads from initial exercise prescription for resistance, speed, and METs.;Short Term: Perform resistance training exercises routinely during rehab and add in resistance training at home;Long Term: Improve cardiorespiratory fitness, muscular endurance and strength as measured by increased METs and functional capacity (6MWT)       Able to understand and use rate of perceived exertion (RPE) scale Yes       Intervention Provide education and explanation on how to use RPE scale       Expected Outcomes Short Term: Able to use RPE daily in rehab to express subjective intensity level;Long Term:  Able to use RPE to guide intensity level when exercising independently       Able to understand and use Dyspnea scale Yes       Intervention Provide education and explanation on how to use Dyspnea scale       Expected Outcomes Short Term: Able to use Dyspnea scale daily in rehab to express subjective sense of shortness of breath during exertion;Long Term: Able to use Dyspnea scale to guide intensity level when exercising independently       Knowledge and understanding of Target Heart Rate Range (THRR) Yes       Intervention Provide education and explanation of THRR including how the numbers were predicted and where they are located for reference       Expected Outcomes Short Term: Able to state/look up THRR;Long Term: Able to use THRR to govern intensity when exercising independently;Short Term: Able to use daily as guideline for intensity in rehab       Able to check pulse independently Yes       Intervention Provide education and demonstration on how to check pulse in carotid and radial arteries.;Review the importance of being able to check your own pulse for safety during independent exercise       Expected Outcomes Short Term: Able to explain why pulse checking is important during independent exercise;Long Term: Able to check pulse independently and accurately        Understanding of Exercise Prescription Yes       Intervention Provide education, explanation, and written materials on patient's individual exercise prescription       Expected Outcomes Short Term: Able to explain program exercise prescription;Long Term: Able to explain home exercise prescription to exercise independently                Exercise Goals Re-Evaluation :  Exercise Goals Re-Evaluation     Row Name 03/13/22 1739 03/20/22 0948 04/01/22 1533 04/15/22 1542 04/24/22 1753     Exercise Goal Re-Evaluation   Exercise Goals Review Able to understand and use rate of perceived exertion (RPE) scale;Able to understand and use Dyspnea scale;Knowledge and understanding of Target Heart Rate Range (THRR);Understanding of Exercise Prescription Understanding of Exercise Prescription;Increase Physical Activity;Increase Strength and Stamina Understanding of Exercise Prescription;Increase Physical Activity;Increase Strength and Stamina Understanding of Exercise Prescription;Increase Physical Activity;Increase Strength and Stamina Increase Physical Activity;Able to understand and use rate of perceived exertion (RPE) scale;Knowledge  and understanding of Target Heart Rate Range (THRR);Understanding of Exercise Prescription;Increase Strength and Stamina;Able to check pulse independently   Comments Reviewed RPE and dyspnea scales, THR and program prescription with pt today.  Pt voiced understanding and was given a copy of goals to take home. Ron is doing well in rehab for the first couple of sessions that he has been here. He had a hypertensive response to exercise during his 6MWT and has had better BP responses during his rehab sessions as he has been taking his medications. He was able to follow his exercise prescription and will continue to monitor while he furthers in the program. Ron continues to do well in rehab. He increased up to 28 laps on the track. He improved to 2.47 average METs as well. He has also  tolerated using 4 lb hand weights for resistance training. We will continue to monitor his progress in the program. Ron continues to do well in rehab. He recently improved to 2.87 average METs. He also increased his laps on the track to 32 laps. He improved to level 3 on the T4 as well. He moved up to 6 lb hand weights for resistance training also. We will continue to monitor his progress in the program. Reviewed home exercise with pt today.  Pt plans to attend [facility] for exercise.  Reviewed THR, pulse, RPE, sign and symptoms, pulse oximetery and when to call 911 or MD.  Also discussed weather considerations and indoor options.  Pt voiced understanding.   Expected Outcomes Short: Use RPE daily to regulate intensity. Long: Follow program prescription in THR. Short: Follow current exercise prescription and continue to stay compliant with BP medications Long: Increase overall strength and stamina Short: Continue to increase laps on the track. Long: Continue to improve overall MET levels. Short: Continue to increase laps on the track. Long: Continue to improve strength and stamina. Short: start with one extra day of exercise. Long: independently exercise at home with appropriate prescription    Row Name 05/01/22 1211 05/14/22 1529 05/27/22 1552 06/12/22 1314       Exercise Goal Re-Evaluation   Exercise Goals Review Increase Physical Activity;Increase Strength and Stamina;Understanding of Exercise Prescription Increase Physical Activity;Increase Strength and Stamina;Understanding of Exercise Prescription Increase Physical Activity;Increase Strength and Stamina;Understanding of Exercise Prescription Increase Physical Activity;Increase Strength and Stamina;Understanding of Exercise Prescription    Comments Ron is doing well in rehab. He recently improved his overall average MET level to 3.3 METs. He also got up to 66 laps on the track when walking for the full 30 minutes. He improved to 7 lb  hand weights for resistance training as well. We will continue to monitor his progress in the program. Ron continues to do well in rehab. He has increased to level 3 on the XR, level 4 on the T4 Nustep and he is now using 8 lbs for handweights. His laps vary on the track but he did hit 41 laps as one exercise. His BPs have been better overall and RPEs are in appropriate range. We will continue to monitor. Ron is doing well in rehab.  He is now up to 45 laps each day.  He is also up to level 6 on the XR.  We will conitnue to montior his progress. Ron continues to do well in rehab and is close to graduating. He recently improved on his post 6MWT by 15.6%. He also has consistently gotten above 40 laps on the track. We will continue to  monitor his progress until he graduates from the program.    Expected Outcomes Short: try level 2 on the XR. Long: Continue to increase strength and stamina. Short: Add in more laps on the track Long: Continue to increase overall MET level Short: Continue to increased seated equipment Long: Continue to improve stamina Short: Continue to increase workloads. Long: Continue to improve strength and stamina.             Discharge Exercise Prescription (Final Exercise Prescription Changes):  Exercise Prescription Changes - 06/12/22 1300       Response to Exercise   Blood Pressure (Admit) 122/60    Blood Pressure (Exit) 128/72    Heart Rate (Admit) 78 bpm    Heart Rate (Exercise) 110 bpm    Heart Rate (Exit) 80 bpm    Oxygen Saturation (Admit) 97 %    Oxygen Saturation (Exercise) 93 %    Oxygen Saturation (Exit) 95 %    Rating of Perceived Exertion (Exercise) 12    Symptoms none    Duration Continue with 30 min of aerobic exercise without signs/symptoms of physical distress.    Intensity THRR unchanged      Progression   Progression Continue to progress workloads to maintain intensity without signs/symptoms of physical distress.    Average METs 3.51      Resistance  Training   Training Prescription Yes    Weight 8 lb    Reps 10-15      Interval Training   Interval Training No      NuStep   Level 4    Minutes 15    METs 4      REL-XR   Level 6    Minutes 15    METs 4.4      Track   Laps 44    Minutes 15    METs 3.39      Home Exercise Plan   Plans to continue exercise at [employer] (comment)   [facility]   Frequency Add 2 additional days to program exercise sessions.    Initial Home Exercises Provided 04/24/22      Oxygen   Maintain Oxygen Saturation 88% or higher             Nutrition:  Target Goals: Understanding of nutrition guidelines, daily intake of sodium '1500mg'$ , cholesterol '200mg'$ , calories 30% from fat and 7% or less from saturated fats, daily to have 5 or more servings of fruits and vegetables.  Education: All About Nutrition: -Group instruction provided by verbal, written material, interactive activities, discussions, models, and posters to present general guidelines for heart healthy nutrition including fat, fiber, MyPlate, the role of sodium in heart healthy nutrition, utilization of the nutrition label, and utilization of this knowledge for meal planning. Follow up email sent as well. Written material given at graduation. Flowsheet Row Cardiac Rehab from 06/12/2022 in [facility] Cardiac and Pulmonary Rehab  Education need identified 03/04/22       Biometrics:  Pre Biometrics - 03/04/22 1729       Pre Biometrics   Height $Remov'5\' 8"'JCGmOp$  (1.727 m)    Weight 175 lb 1.6 oz (79.4 kg)    BMI (Calculated) 26.63    Single Leg Stand 24 seconds             Post Biometrics - 06/06/22 1734        Post  Biometrics   Height $Remov'5\' 8"'GdMdzX$  (1.727 m)    Weight 175 lb 9.6 oz (  79.7 kg)    BMI (Calculated) 26.71             Nutrition Therapy Plan and Nutrition Goals:  Nutrition Therapy & Goals - 05/13/22 1752       Nutrition Therapy   Diet Heart healthy, low Na, T2DM MNT    Protein (specify units) 65g     Fiber 30 grams    Whole Grain Foods 3 servings    Saturated Fats 16 max. grams    Fruits and Vegetables 8 servings/day    Sodium 2 grams      Personal Nutrition Goals   Nutrition Goal ST: choose whole wheat bread, limit salt use after cooking, practice MyPlate  LT: include a variety of fruits/vegetables, follow MyPlate guidelines for meals, limit Na <2g/day, make 1/2 grains whole    Comments 71 y.o. M admitted to cardiac rehab s/p NSTEMI. PMHx includes T2DM (A1C 6), HTN, CAD, HLD, CKD stg II. Relevant medications includes vit C, vit D3, metformin, MVI. PYP Score: 58. Vegetables & Fruits 7/12. Breads, Grains & Cereals 6/12. Red & Processed Meat 4/12. Poultry 0/2. Fish & Shellfish 3/4. Beans, Nuts & Seeds 3/4. Milk & Dairy Foods 3/6. Toppings, Oils, Seasonings & Salt 12/20. Sweets, Snacks & Restaurant Food 11/14. Beverages 9/10. He reports that his wife will cook breakfast and dinner for him; he reports she is a Lawyer and she will make sure to make his food heart healthy. They use whole grains, lean proteins, no salt with cooking, and uses olive oil and canola oil. He will still use salt after cooking and fry foods with canola oil or air fryer. B: green smoothie with spinach, yogurt, oatmilk, and other vegetables. L: [place] cold-cut sandwich on rye or italian bread. D: wife will make dinner - varies. Disucssed general heart healthy eating and T2DM MNT. Suggested using air fryer more often than frying in oil, use sliced baked chicken more often than deli meat for sandwiches, choose whole wheat bread, choose seasonings with smaller amounts or no salt added.      Intervention Plan   Intervention Prescribe, educate and counsel regarding individualized specific dietary modifications aiming towards targeted core components such as weight, hypertension, lipid management, diabetes, heart failure and other comorbidities.    Expected Outcomes Short Term Goal: Understand basic principles of dietary content,  such as calories, fat, sodium, cholesterol and nutrients.;Short Term Goal: A plan has been developed with personal nutrition goals set during dietitian appointment.;Long Term Goal: Adherence to prescribed nutrition plan.             Nutrition Assessments:  MEDIFICTS Score Key: ?70 Need to make dietary changes  40-70 Heart Healthy Diet ? 40 Therapeutic Level Cholesterol Diet  Flowsheet Row Cardiac Rehab from 03/04/2022 in [facility] Cardiac and Pulmonary Rehab  Picture Your Plate Total Score on Admission 58      Picture Your Plate Scores: <65 Unhealthy dietary pattern with much room for improvement. 41-50 Dietary pattern unlikely to meet recommendations for good health and room for improvement. 51-60 More healthful dietary pattern, with some room for improvement.  >60 Healthy dietary pattern, although there may be some specific behaviors that could be improved.    Nutrition Goals Re-Evaluation:  Nutrition Goals Re-Evaluation     [place] Name 04/04/22 1737 04/24/22 1744           Goals   Current Weight 173 lb (78.5 kg) --      Nutrition Goal Work on foods that he needs to eat. --  Comment He would like to meet the dietitian. Schedule meeting with the dietitian      Expected Outcome Short: meet with dietitian. Long: maintain a diet that he can adhere to. Short: meet with the dietitian; Long: maintain a heart healthy diet.               Nutrition Goals Discharge (Final Nutrition Goals Re-Evaluation):  Nutrition Goals Re-Evaluation - 04/24/22 1744       Goals   Comment Schedule meeting with the dietitian    Expected Outcome Short: meet with the dietitian; Long: maintain a heart healthy diet.             Psychosocial: Target Goals: Acknowledge presence or absence of significant depression and/or stress, maximize coping skills, provide positive support system. Participant is able to verbalize types and ability to use techniques and skills needed for reducing stress and  depression.   Education: Stress, Anxiety, and Depression - Group verbal and visual presentation to define topics covered.  Reviews how body is impacted by stress, anxiety, and depression.  Also discusses healthy ways to reduce stress and to treat/manage anxiety and depression.  Written material given at graduation.   Education: Sleep Hygiene -Provides group verbal and written instruction about how sleep can affect your health.  Define sleep hygiene, discuss sleep cycles and impact of sleep habits. Review good sleep hygiene tips.    Initial Review & Psychosocial Screening:  Initial Psych Review & Screening - 02/18/22 1452       Initial Review   Current issues with None Identified      Family Dynamics   Good Support System? Yes   wife, 20 years,     Barriers   Psychosocial barriers to participate in program There are no identifiable barriers or psychosocial needs.      Screening Interventions   Interventions Encouraged to exercise;To provide support and resources with identified psychosocial needs;Provide feedback about the scores to participant    Expected Outcomes Short Term goal: Utilizing psychosocial counselor, staff and physician to assist with identification of specific Stressors or current issues interfering with healing process. Setting desired goal for each stressor or current issue identified.;Long Term Goal: Stressors or current issues are controlled or eliminated.;Short Term goal: Identification and review with participant of any Quality of Life or Depression concerns found by scoring the questionnaire.;Long Term goal: The participant improves quality of Life and PHQ9 Scores as seen by post scores and/or verbalization of changes             Quality of Life Scores:   Quality of Life - 03/04/22 1737       Quality of Life   Select Quality of Life      Quality of Life Scores   Health/Function Pre 19.07 %    Socioeconomic Pre 21.88 %    Psych/Spiritual Pre 26.86 %     Family Pre 26.86 %    GLOBAL Post 22.06 %            Scores of 19 and below usually indicate a poorer quality of life in these areas.  A difference of  2-3 points is a clinically meaningful difference.  A difference of 2-3 points in the total score of the Quality of Life Index has been associated with significant improvement in overall quality of life, self-image, physical symptoms, and general health in studies assessing change in quality of life.  PHQ-9: Review Flowsheet  More data may exist      03/04/2022  05/14/2018 01/06/2017 09/04/2016 08/21/2015  Depression screen PHQ 2/9  Decreased Interest 0 0 0 0 0  Down, Depressed, Hopeless 0 0 0 0 0  PHQ - 2 Score 0 0 0 0 0  Altered sleeping 1 0 - - -  Tired, decreased energy 1 0 - - -  Change in appetite 0 0 - - -  Feeling bad or failure about yourself  0 0 - - -  Trouble concentrating 1 0 - - -  Moving slowly or fidgety/restless 0 0 - - -  Suicidal thoughts 0 0 - - -  PHQ-9 Score 3 0 - - -  Difficult doing work/chores Somewhat difficult Not difficult at all - - -   Interpretation of Total Score  Total Score Depression Severity:  1-4 = Minimal depression, 5-9 = Mild depression, 10-14 = Moderate depression, 15-19 = Moderately severe depression, 20-27 = Severe depression   Psychosocial Evaluation and Intervention:  Psychosocial Evaluation - 02/18/22 1501       Psychosocial Evaluation & Interventions   Interventions Encouraged to exercise with the program and follow exercise prescription    Comments Ron has no barriers to attending the program. He lives with his wife and she is his support. He wants to start driving amd I did advise him to check with his physician.  Ron has  recently quit tobaccouse. He wants to saty quit.   He is ready to get started with the program.    Expected Outcomes STTG Ron attends all scheduled session, he continue to remain without tobacco use. LTG Ron continues with his exercise progression and remain tobacco  free.    Continue Psychosocial Services  Follow up required by staff             Psychosocial Re-Evaluation:  Psychosocial Re-Evaluation     Laurie Name 04/04/22 2025 04/24/22 1738           Psychosocial Re-Evaluation   Current issues with None Identified --      Comments Patient reports no issues with their current mental states, sleep, stress, depression or anxiety. Will follow up with patient in a few weeks for any changes. Patient commented that his sleep is disturbed 4-5 times a night due to needing to use the bathroom and he usually has a hard time falling back asleep after 2 am. He states this is usual for him and he thinks the early wake up time is because he goes to sleep right after dinner time. He is content with his sleep habits for now and doesn't see a need to address them right now. He also mentioned he is wanting to work on [employer] a heart healthy lifestyle after graduation with exercise and diet but has a hard time with being consistent. He feels like this program is helping getting him into a routine.      Expected Outcomes Short: Continue to exercise regularly to support mental health and notify staff of any changes. Long: maintain mental health and well being through teaching of rehab or prescribed medications independently. Short: continue coming to the program to help develop a routine and stay encouraged. Long: maintain a heart healthy lifestyle independently      Interventions Encouraged to attend Cardiac Rehabilitation for the exercise Encouraged to attend Cardiac Rehabilitation for the exercise      Continue Psychosocial Services  Follow up required by staff Follow up required by staff               Psychosocial  Discharge (Final Psychosocial Re-Evaluation):  Psychosocial Re-Evaluation - 04/24/22 1738       Psychosocial Re-Evaluation   Comments Patient commented that his sleep is disturbed 4-5 times a night due to needing to use the bathroom and he  usually has a hard time falling back asleep after 2 am. He states this is usual for him and he thinks the early wake up time is because he goes to sleep right after dinner time. He is content with his sleep habits for now and doesn't see a need to address them right now. He also mentioned he is wanting to work on [employer] a heart healthy lifestyle after graduation with exercise and diet but has a hard time with being consistent. He feels like this program is helping getting him into a routine.    Expected Outcomes Short: continue coming to the program to help develop a routine and stay encouraged. Long: maintain a heart healthy lifestyle independently    Interventions Encouraged to attend Cardiac Rehabilitation for the exercise    Continue Psychosocial Services  Follow up required by staff             Vocational Rehabilitation: Provide vocational rehab assistance to qualifying candidates.   Vocational Rehab Evaluation & Intervention:   Education: Education Goals: Education classes will be provided on a variety of topics geared toward better understanding of heart health and risk factor modification. Participant will state understanding/return demonstration of topics presented as noted by education test scores.  Learning Barriers/Preferences:   General Cardiac Education Topics:  AED/CPR: - Group verbal and written instruction with the use of models to demonstrate the basic use of the AED with the basic ABC's of resuscitation.   Anatomy and Cardiac Procedures: - Group verbal and visual presentation and models provide information about basic cardiac anatomy and function. Reviews the testing methods done to diagnose heart disease and the outcomes of the test results. Describes the treatment choices: Medical Management, Angioplasty, or Coronary Bypass Surgery for treating various heart conditions including Myocardial Infarction, Angina, Valve Disease, and Cardiac Arrhythmias.  Written  material given at graduation. Flowsheet Row Cardiac Rehab from 06/12/2022 in [facility] Cardiac and Pulmonary Rehab  Education need identified 03/04/22       Medication Safety: - Group verbal and visual instruction to review commonly prescribed medications for heart and lung disease. Reviews the medication, class of the drug, and side effects. Includes the steps to properly store meds and maintain the prescription regimen.  Written material given at graduation.   Intimacy: - Group verbal instruction through game format to discuss how heart and lung disease can affect sexual intimacy. Written material given at graduation..   Know Your Numbers and Heart Failure: - Group verbal and visual instruction to discuss disease risk factors for cardiac and pulmonary disease and treatment options.  Reviews associated critical values for Overweight/Obesity, Hypertension, Cholesterol, and Diabetes.  Discusses basics of heart failure: signs/symptoms and treatments.  Introduces Heart Failure Zone chart for action plan for heart failure.  Written material given at graduation.   Infection Prevention: - Provides verbal and written material to individual with discussion of infection control including proper hand washing and proper equipment cleaning during exercise session. Flowsheet Row Cardiac Rehab from 06/12/2022 in [facility] Cardiac and Pulmonary Rehab  Date 03/04/22  Educator [facility]  Instruction Review Code 1- Verbalizes Understanding       Falls Prevention: - Provides verbal and written material to individual with discussion of falls prevention and safety. Flowsheet Row Cardiac  Rehab from 06/12/2022 in [facility] Cardiac and Pulmonary Rehab  Date 03/04/22  Educator [facility]  Instruction Review Code 1- Verbalizes Understanding       Other: -Provides group and verbal instruction on various topics (see comments)   Knowledge Questionnaire Score:  Knowledge Questionnaire Score - 03/04/22 1737       Knowledge  Questionnaire Score   Pre Score 20/26             Core Components/Risk Factors/Patient Goals at Admission:  Personal Goals and Risk Factors at Admission - 03/04/22 1733       Core Components/Risk Factors/Patient Goals on Admission    Weight Management Yes;Weight Gain    Intervention Weight Management: Develop a combined nutrition and exercise program designed to reach desired caloric intake, while maintaining appropriate intake of nutrient and fiber, sodium and fats, and appropriate energy expenditure required for the weight goal.;Weight Management: Provide education and appropriate resources to help participant work on and attain dietary goals.    Admit Weight 175 lb 1.6 oz (79.4 kg)    Goal Weight: Short Term 180 lb (81.6 kg)    Goal Weight: Long Term 180 lb (81.6 kg)    Expected Outcomes Weight Maintenance: Understanding of the daily nutrition guidelines, which includes 25-35% calories from fat, 7% or less cal from saturated fats, less than $RemoveB'200mg'ioZXUtAi$  cholesterol, less than 1.5gm of sodium, & 5 or more servings of fruits and vegetables daily;Short Term: Continue to assess and modify interventions until short term weight is achieved;Long Term: Adherence to nutrition and physical activity/exercise program aimed toward attainment of established weight goal;Understanding recommendations for meals to include 15-35% energy as protein, 25-35% energy from fat, 35-60% energy from carbohydrates, less than $RemoveB'200mg'tKnFoZCJ$  of dietary cholesterol, 20-35 gm of total fiber daily;Weight Gain: Understanding of general recommendations for a high calorie, high protein meal plan that promotes weight gain by distributing calorie intake throughout the day with the consumption for 4-5 meals, snacks, and/or supplements;Understanding of distribution of calorie intake throughout the day with the consumption of 4-5 meals/snacks    Tobacco Cessation Yes    Number of packs per day Gordan is a recent tobacco user. Intervention for  tobacco cessation was provided at the initial medical review. He was asked about readiness to quit and reported he quit a few weeks ago . Staff will continue to provide cessation encouragement and follow up with the patient throughout the program.    Intervention Assist the participant in steps to quit. Provide individualized education and counseling about committing to Tobacco Cessation, relapse prevention, and pharmacological support that can be provided by physician.;Advice worker, assist with locating and accessing local/national Quit Smoking programs, and support quit date choice.    Expected Outcomes Short Term: Will demonstrate readiness to quit, by selecting a quit date.;Short Term: Will quit all tobacco product use, adhering to prevention of relapse plan.;Long Term: Complete abstinence from all tobacco products for at least 12 months from quit date.    Diabetes Yes    Intervention Provide education about signs/symptoms and action to take for hypo/hyperglycemia.;Provide education about proper nutrition, including hydration, and aerobic/resistive exercise prescription along with prescribed medications to achieve blood glucose in normal ranges: Fasting glucose 65-99 mg/dL    Expected Outcomes Short Term: Participant verbalizes understanding of the signs/symptoms and immediate care of hyper/hypoglycemia, proper foot care and importance of medication, aerobic/resistive exercise and nutrition plan for blood glucose control.;Long Term: Attainment of HbA1C < 7%.    Hypertension Yes    Intervention  Provide education on lifestyle modifcations including regular physical activity/exercise, weight management, moderate sodium restriction and increased consumption of fresh fruit, vegetables, and low fat dairy, alcohol moderation, and smoking cessation.;Monitor prescription use compliance.    Expected Outcomes Short Term: Continued assessment and intervention until BP is < 140/70mm HG in  hypertensive participants. < 130/62mm HG in hypertensive participants with diabetes, heart failure or chronic kidney disease.;Long Term: Maintenance of blood pressure at goal levels.    Lipids Yes    Intervention Provide education and support for participant on nutrition & aerobic/resistive exercise along with prescribed medications to achieve LDL '70mg'$ , HDL >$Remo'40mg'ZxJyd$ .    Expected Outcomes Short Term: Participant states understanding of desired cholesterol values and is compliant with medications prescribed. Participant is following exercise prescription and nutrition guidelines.;Long Term: Cholesterol controlled with medications as prescribed, with individualized exercise RX and with personalized nutrition plan. Value goals: LDL < $Rem'70mg'Mbad$ , HDL > 40 mg.             Education:Diabetes - Individual verbal and written instruction to review signs/symptoms of diabetes, desired ranges of glucose level fasting, after meals and with exercise. Acknowledge that pre and post exercise glucose checks will be done for 3 sessions at entry of program. [place] from 06/12/2022 in [facility] Cardiac and Pulmonary Rehab  Date 03/04/22  Educator [facility]  Instruction Review Code 1- Verbalizes Understanding       Core Components/Risk Factors/Patient Goals Review:   Goals and Risk Factor Review     Row Name 03/04/22 1730 04/04/22 1739 04/24/22 1733         Core Components/Risk Factors/Patient Goals Review   Personal Goals Review Tobacco Cessation Weight Management/Obesity;Diabetes;Tobacco Cessation Weight Management/Obesity;Diabetes;Tobacco Cessation     Review Montrell Cessna has recently quit tobacco use within the last 6 months. Intervention for relapse prevention was provided at the initial medical review. He was encouraged to continue to with tobacco cessation and was provided information on relapse prevention. Patient received information about combination therapy, tobacco cessation classes, quit line,  and quit smoking apps in case of a relapse. Patient demonstrated understanding of this material.Staff will continue to provide encouragement and follow up with the patient throughout the program. Ron wants to work on his weight. He wants to lose a few pounds. He states that he does not check his sugars at home.He has means to check his sugar at home. Informed him that it would be good to keep his sugars in check. He states that he has been smoking a few cigaretts a day. He states that he is not interested in quitting smoking all together. Ron states that he does not smoke enough to affect him. Ron said he is maintaining his weight, but is still looking to lose a few pounds He is trying to be more active. He is wanting to meet with the dietitian to see what else he can do to help lose weight. He states his daibetes is being managed by his medication, but he is not checking it unless he is symptomatic. His wife is a Marine scientist and helps manage this as well. His blood pressure has been stable and he reports no issues with his medication. He is smoking about 2 cigs a day, which is a decrease from his prior to his MI but he is wanting to quit completely. The cessation packet was given and education provided on quitting tips     Expected Outcomes Short: continue no to smoke Long: maintain tobacco free lifestyle. Short: decrease  tobacco use and  monitor blood sugars. Long: maintain blood sugars and tobacco cessation independently. Short: decrease to one cig and meet with dietician. Long: quit tobacco, meet target weight.              Core Components/Risk Factors/Patient Goals at Discharge (Final Review):   Goals and Risk Factor Review - 04/24/22 1733       Core Components/Risk Factors/Patient Goals Review   Personal Goals Review Weight Management/Obesity;Diabetes;Tobacco Cessation    Review Ron said he is maintaining his weight, but is still looking to lose a few pounds He is trying to be more active. He is  wanting to meet with the dietitian to see what else he can do to help lose weight. He states his daibetes is being managed by his medication, but he is not checking it unless he is symptomatic. His wife is a Marine scientist and helps manage this as well. His blood pressure has been stable and he reports no issues with his medication. He is smoking about 2 cigs a day, which is a decrease from his prior to his MI but he is wanting to quit completely. The cessation packet was given and education provided on quitting tips    Expected Outcomes Short: decrease to one cig and meet with dietician. Long: quit tobacco, meet target weight.             ITP Comments:  ITP Comments     Row Name 02/18/22 1507 03/04/22 1732 03/13/22 1356 03/13/22 1739 04/09/22 1153   ITP Comments Virtual orientation call completed today. he has an appointment on Date: 03/04/2022  for EP eval and gym Orientation.  Documentation of diagnosis can be found in  [facility]  Date: 02/08/2022  Eulis is a recent tobacco user. Intervention for tobacco cessation was provided at the initial medical review. He was asked about readiness to quit and reported he quit a few weeks ago . Staff will continue to provide cessation encouragement and follow up with the patient throughout the program. Completed 6MWT and gym orientation. Initial ITP created and sent for review to Dr. Emily Filbert, Medical Director.Ron Maggart has recently quit tobacco use within the last 6 months. Intervention for relapse prevention was provided at the initial medical review. He was encouraged to continue to with tobacco cessation and was provided information on relapse prevention. Patient received information about combination therapy, tobacco cessation classes, quit line, and quit smoking apps in case of a relapse. Patient demonstrated understanding of this material.Staff will continue to provide encouragement and follow up with the patient throughout the program.  Note was sent to Dr. Saunders Revel  reguarding BP parameters during exercise for patient. 30 Day review completed. Medical Director ITP review done, changes made as directed, and signed approval by Medical Director. First full day of exercise!  Patient was oriented to gym and equipment including functions, settings, policies, and procedures.  Patient's individual exercise prescription and treatment plan were reviewed.  All starting workloads were established based on the results of the 6 minute walk test done at initial orientation visit.  The plan for exercise progression was also introduced and progression will be customized based on patient's performance and goals. Patient attendance has been inconsistent.    [place] Name 04/10/22 1036 05/08/22 1001 06/05/22 1352 06/19/22 1752     ITP Comments 30 Day review completed. Medical Director ITP review done, changes made as directed, and signed approval by Medical Director. 30 Day review completed. Medical Director ITP review done,  changes made as directed, and signed approval by Medical Director. 30 Day review completed. Medical Director ITP review done, changes made as directed, and signed approval by Medical Director. Zia graduated today from  rehab with 36 sessions completed.  Details of the patient's exercise prescription and what He needs to do in order to continue the prescription and progress were discussed with patient.  Patient was given a copy of prescription and goals.  Patient verbalized understanding.  Montrey plans to continue to exercise by attending a local gym.             Comments: Discharge ITP

## 2022-06-19 NOTE — Progress Notes (Signed)
Daily Session Note  Patient Details  Name: Daniel Fuentes MRN: [id number] Date of Birth: 05-28-1951 Referring Provider:   Flowsheet Row Cardiac Rehab from 03/04/2022 in [facility]  Referring Provider Dr. Saunders Revel       Encounter Date: 06/19/2022  Check In:  Session Check In - 06/19/22 1750       Check-In   Supervising physician immediately available to respond to emergencies See telemetry face sheet for immediately available ER MD    Location [facility]-Cardiac & Pulmonary Rehab    Staff Present Renita Papa, RN BSN;Joseph Tessie Fass, RCP,RRT,BSRT;Melissa [place], [place], LDN    Virtual Visit No    Medication changes reported     No    Fall or balance concerns reported    No    Tobacco Cessation No Change    Current number of cigarettes/nicotine per day     3    Warm-up and Cool-down Performed on first and last piece of equipment    Resistance Training Performed Yes    VAD Patient? No    PAD/SET Patient? No      Pain Assessment   Currently in Pain? No/denies                Social History   Tobacco Use  Smoking Status Former   Packs/day: 0.20   Years: 50.00   Total pack years: 10.00   Types: Pipe, Cigarettes   Start date: 09/04/1966   Quit date: 02/08/2022   Years since quitting: 0.3  Smokeless Tobacco Never    Goals Met:  Independence with exercise equipment Exercise tolerated well No report of concerns or symptoms today Strength training completed today  Goals Unmet:  Not Applicable  Comments:  Tomi graduated today from  rehab with 36 sessions completed.  Details of the patient's exercise prescription and what He needs to do in order to continue the prescription and progress were discussed with patient.  Patient was given a copy of prescription and goals.  Patient verbalized understanding.  Kaiel plans to continue to exercise by attending a local gym.    Dr. Emily Filbert is Medical Director for [facility].  Dr. Ottie Glazier is Medical Director for [facility] Pulmonary Rehabilitation.

## 2022-06-19 NOTE — Progress Notes (Signed)
Discharge Summary: Daniel Fuentes (DOB 05/15/1951)  Jori Moll graduated today from  rehab with 36 sessions completed.  Details of the patient's exercise prescription and what He needs to do in order to continue the prescription and progress were discussed with patient.  Patient was given a copy of prescription and goals.  Patient verbalized understanding.  [place]on plans to continue to exercise by attending a local gym.   [place] Name 03/04/22 1721 06/06/22 1737       6 Minute Walk   Phase Initial Discharge    Distance 1215 feet 1405 feet    Distance % Change -- 15.6 %    Distance Feet Change -- 190 ft    Walk Time 6 minutes 6 minutes    # of Rest Breaks 0 0    MPH 2.3 2.66    METS 3.45 3.26    RPE 11 12    Perceived Dyspnea  0 0    VO2 Peak 12.06 11.43    Symptoms Yes (comment) Yes (comment)    Comments High BP response to 6 MWT (220/100). Patient forgot to take meds today. "Burning" calves    Resting HR 63 bpm 78 bpm    Resting BP 150/80 122/60    Resting Oxygen Saturation  99 % 98 %    Exercise Oxygen Saturation  during 6 min walk 100 % 97 %    Max Ex. HR 108 bpm 110 bpm    Max Ex. BP 220/100 154/80    2 Minute Post BP 188/80  After further rest patient BP came down to 160/80 --

## 2022-06-24 ENCOUNTER — Ambulatory Visit: Payer: Medicare Other | Admitting: Nurse Practitioner

## 2022-06-24 NOTE — Progress Notes (Deleted)
Office Visit    Patient Name: Daniel Fuentes Date of Encounter: 06/24/2022  Primary Care Provider:  Minna Merritts, MD Primary Cardiologist:  Ida Rogue, MD  Chief Complaint    71 y/o ? w/ a h/o CAD, HTN, HL, DMII, CKD II, prior subdural hematoma in the setting of head trauma status post evaluation, abdominal aortic aneurysm, and tobacco abuse, who presents for follow-up related to CAD.  Past Medical History    Past Medical History:  Diagnosis Date   CAD (coronary artery disease)    a. 01/2022 NSTEMI/PCI: LM nl, LAD 34m D1 50, D3 90, LCX 740m95/60d, OM2 80, RCA 100p/m, 8042m0d (3.5x12, 3.0x38, & 3.5x8 Onyx Frontier DES), RPAV1 90 (2.0x12 Onyx Frontier DES), RPAV2 99, RPL1 50, RPL2 99. EF 50-55%; b. 01/2022 Staged PCI: LCX (3.5x18 & 3.0x38 Onyx Frontier DES), OM2 (3.0x22 Onyx Frontier DES).   CKD (chronic kidney disease), stage II    Di[place] ([place]  Hyperlipidemia    Hypertension    Ischemic cardiomyopathy    a. 01/2022 Echo: EF 50-55%, mod LVH, GRI DD, mild basal-mid inf/infsept HK. Mildly reduced RV fxn, Mildly dil RA. Mild MR. MIld-mod TR. AoV sclerosis w/o stenosis.   Past Surgical History:  Procedure Laterality Date   BRAIN SURGERY  2017   Subdural Hematoma   COLONOSCOPY WITH PROPOFOL N/A 05/30/2021   Procedure: COLONOSCOPY WITH PROPOFOL;  Surgeon: VanLin LandsmanD;  [facility];  Service: Gastroenterology;  Laterality: N/A;   CORONARY STENT INTERVENTION N/A 02/08/2022   Procedure: CORONARY STENT INTERVENTION;  Surgeon: EndNelva BushD;  [facility];  Service: Cardiovascular;  Laterality: N/A;   CORONARY STENT INTERVENTION N/A 02/11/2022   Procedure: CORONARY STENT INTERVENTION;  Surgeon: AriWellington HampshireD;  [facility];  Service: Cardiovascular;  Laterality: N/A;   LEFT HEART CATH AND CORONARY ANGIOGRAPHY N/A 02/08/2022   Procedure: LEFT HEART CATH AND CORONARY  ANGIOGRAPHY;  Surgeon: EndNelva BushD;  [facility];  Service: Cardiovascular;  Laterality: N/A;    Allergies  Allergies  Allergen Reactions   Simvastatin Other (See Comments)    Muscle aches   Statins Other (See Comments)    Joint pain     History of Present Illness    [age]ar old male with above complex past medical history including hypertension, hyperlipidemia, diabetes, stage II chronic kidney disease, subdural hematoma in the setting of head trauma status post evacuation, abdominal aortic aneurysm, tobacco abuse, and coronary artery disease.  [facility] with complaints of presyncope, diaphoresis, dyspnea, and chest pain and was relatively hypotensive.  Troponin was elevated and subsequently peaked at 18,478.  Echo showed an EF of 50 to 55% with mild basal-mid inferior and inferoseptal hypokinesis.  Catheterization showed an occluded RCA with severe left circumflex and obtuse marginal disease.  The RCA was felt to be the infarct vessel and there was successfully treated with 3 drug-eluting stents.  The RPAV was also treated with a drug-eluting stent.  He underwent staged PCI during the same hospitalization of the left circumflex/OM 2, with 2 stents placed in the circumflex and 1 in the obtuse marginal.  He was discharged home on aspirin, beta-blocker, Plavix, ARB, and Repatha therapy (statin intolerant).  Mr. TorSandifords last seen in cardiology clinic in June 2023 at which time he was feeling well and participating in cardiac rehabilitation.  Home Medications  Current Outpatient Medications  Medication Sig Dispense Refill   amLODipine (NORVASC) 10 MG tablet Take 1 tablet (10 mg total) by mouth daily. 90 tablet 3   aspirin EC 81 MG tablet Take 1 tablet (81 mg total) by mouth daily. 30 tablet 0   carvedilol (COREG) 6.25 MG tablet Take 1 tablet (6.25 mg total) by mouth 2 (two) times daily with a meal. 180 tablet 3    Cholecalciferol (VITAMIN D3) 2000 units capsule Take 2,000 Units by mouth daily.     Evolocumab (REPATHA SURECLICK) 774 MG/ML SOAJ Inject 140 mg into the skin every 14 (fourteen) days. 2 mL 11   losartan (COZAAR) 25 MG tablet Take 1 tablet (25 mg total) by mouth daily. 90 tablet 3   metFORMIN (GLUCOPHAGE) 1000 MG tablet Take 1 tablet (1,000 mg total) by mouth daily. Start tomorrow     Multiple Vitamins-Minerals (MULTIVITAMIN WITH MINERALS) tablet Take 1 tablet by mouth daily.     nitroGLYCERIN (NITROSTAT) 0.4 MG SL tablet Place 1 tablet (0.4 mg total) under the tongue every 5 (five) minutes as needed for chest pain. 30 tablet 0   solifenacin (VESICARE) 10 MG tablet Take 10 mg by mouth daily.     tamsulosin (FLOMAX) 0.4 MG CAPS capsule Take 0.4 mg by mouth daily.     vitamin C (ASCORBIC ACID) 500 MG tablet Take 500 mg by mouth.     No current facility-administered medications for this visit.     Review of Systems    ***.  All other systems reviewed and are otherwise negative except as noted above.    Physical Exam    VS:  There were no vitals taken for this visit. , BMI There is no height or weight on file to calculate BMI.     GEN: Well nourished, well developed, in no acute distress. HEENT: normal. Neck: Supple, no JVD, carotid bruits, or masses. Cardiac: RRR, no murmurs, rubs, or gallops. No clubbing, cyanosis, edema.  Radials/DP/PT 2+ and equal bilaterally.  Respiratory:  Respirations regular and unlabored, clear to auscultation bilaterally. GI: Soft, nontender, nondistended, BS + x 4. MS: no deformity or atrophy. Skin: warm and dry, no rash. Neuro:  Strength and sensation are intact. Psych: Normal affect.  Accessory Clinical Findings    ECG personally reviewed by me today - *** - no acute changes.  Lab Results  Component Value Date   WBC 6.4 02/12/2022   HGB 12.4 (L) 02/12/2022   HCT 37.3 (L) 02/12/2022   MCV 87.8 02/12/2022   PLT 248 02/12/2022   Lab Results   Component Value Date   CREATININE 1.07 02/12/2022   BUN 16 02/12/2022   NA 135 02/12/2022   K 3.7 02/12/2022   CL 106 02/12/2022   CO2 22 02/12/2022   Lab Results  Component Value Date   ALT 11 03/13/2022   AST 29 03/13/2022   ALKPHOS 56 03/13/2022   BILITOT 0.9 03/13/2022   Lab Results  Component Value Date   CHOL 142 03/13/2022   HDL 49 03/13/2022   LDLCALC 82 03/13/2022   TRIG 53 03/13/2022   CHOLHDL 2.9 03/13/2022    Lab Results  Component Value Date   HGBA1C 5.9 (H) 02/08/2022    Assessment & Plan    1.  ***   Murray Hodgkins, NP 06/24/2022, 7:31 AM

## 2022-06-28 ENCOUNTER — Ambulatory Visit: Payer: Medicare Other | Attending: Internal Medicine

## 2022-06-28 ENCOUNTER — Ambulatory Visit: Payer: Medicare Other

## 2022-06-28 DIAGNOSIS — I771 Stricture of artery: Secondary | ICD-10-CM

## 2022-06-28 DIAGNOSIS — I7789 Other specified disorders of arteries and arterioles: Secondary | ICD-10-CM

## 2022-07-02 ENCOUNTER — Encounter: Payer: Self-pay | Admitting: Cardiovascular Disease

## 2022-07-02 ENCOUNTER — Ambulatory Visit: Payer: Medicare Other | Attending: Cardiovascular Disease | Admitting: Cardiovascular Disease

## 2022-07-02 VITALS — BP 128/78 | HR 68 | Ht 69.0 in | Wt 176.0 lb

## 2022-07-02 DIAGNOSIS — I2510 Atherosclerotic heart disease of native coronary artery without angina pectoris: Secondary | ICD-10-CM | POA: Diagnosis not present

## 2022-07-02 DIAGNOSIS — I739 Peripheral vascular disease, unspecified: Secondary | ICD-10-CM | POA: Diagnosis not present

## 2022-07-02 DIAGNOSIS — I10 Essential (primary) hypertension: Secondary | ICD-10-CM

## 2022-07-02 DIAGNOSIS — E785 Hyperlipidemia, unspecified: Secondary | ICD-10-CM | POA: Diagnosis not present

## 2022-07-02 NOTE — Progress Notes (Unsigned)
Cardiology Office Note   Date:  07/03/2022   ID:  Daniel Fuentes, DOB 1951/08/01, MRN [id number]  PCP:  Minna Merritts, MD  Cardiologist:   Dr. Rockey Situ  Chief Complaint  Patient presents with   Other    F/u postitive LEA. Meds reviewed verbally with pt.      History of Present Illness: Daniel Fuentes is a 71 y.o. male who was referred for evaluation management of peripheral arterial disease.  He has known history of coronary artery disease status post multivessel stenting in May 2023 after presenting with non-STEMI, essential hypertension, hyperlipidemia, type 2 diabetes, stage II chronic kidney disease, prior subdural hematoma in the setting of head trauma, abdominal aortic aneurysm and tobacco use. He is known to have abdominal aortic aneurysm with most recent duplex showed a diameter of 3.1 cm.  Lower extremity arterial Doppler showed an ABI of 0.6 bilaterally.  Duplex on the right showed occluded distal SFA and popliteal artery with reconstitution in the midsegment.  On the left, the distal SFA was likely also occluded but not well visualized. He reports prolonged symptoms of bilateral calf claudication worse on the left than the right.  The symptoms became noticeable when he attended cardiac rehab recently.  He developed pain after walking about 800 feet.  He usually does not have to stop and rest is able to work through..  He has no rest pain and no lower extremity ulceration.  He smokes 2 to 3 cigarettes a day.  No chest pain or shortness of breath.   Past Medical History:  Diagnosis Date   CAD (coronary artery disease)    a. 01/2022 NSTEMI/PCI: LM nl, LAD 18m D1 50, D3 90, LCX 764m95/60d, OM2 80, RCA 100p/m, 8015m0d (3.5x12, 3.0x38, & 3.5x8 Onyx Frontier DES), RPAV1 90 (2.0x12 Onyx Frontier DES), RPAV2 99, RPL1 50, RPL2 99. EF 50-55%; b. 01/2022 Staged PCI: LCX (3.5x18 & 3.0x38 Onyx Frontier DES), OM2 (3.0x22 Onyx Frontier DES).   CKD (chronic kidney disease), stage II     Diabetes [place] ([place]  Hyperlipidemia    Hypertension    Ischemic cardiomyopathy    a. 01/2022 Echo: EF 50-55%, mod LVH, GRI DD, mild basal-mid inf/infsept HK. Mildly reduced RV fxn, Mildly dil RA. Mild MR. MIld-mod TR. AoV sclerosis w/o stenosis.    Past Surgical History:  Procedure Laterality Date   BRAIN SURGERY  2017   Subdural Hematoma   COLONOSCOPY WITH PROPOFOL N/A 05/30/2021   Procedure: COLONOSCOPY WITH PROPOFOL;  Surgeon: VanLin LandsmanD;  [facility];  Service: Gastroenterology;  Laterality: N/A;   CORONARY STENT INTERVENTION N/A 02/08/2022   Procedure: CORONARY STENT INTERVENTION;  Surgeon: EndNelva BushD;  [facility];  Service: Cardiovascular;  Laterality: N/A;   CORONARY STENT INTERVENTION N/A 02/11/2022   Procedure: CORONARY STENT INTERVENTION;  Surgeon: AriWellington HampshireD;  [facility];  Service: Cardiovascular;  Laterality: N/A;   LEFT HEART CATH AND CORONARY ANGIOGRAPHY N/A 02/08/2022   Procedure: LEFT HEART CATH AND CORONARY ANGIOGRAPHY;  Surgeon: EndNelva BushD;  [facility];  Service: Cardiovascular;  Laterality: N/A;     Current Outpatient Medications  Medication Sig Dispense Refill   amLODipine (NORVASC) 10 MG tablet Take 1 tablet (10 mg total) by mouth daily. 90 tablet 3   aspirin EC 81 MG tablet Take 1 tablet (81 mg total) by mouth daily. 30 tablet 0   carvedilol (COREG)  6.25 MG tablet Take 1 tablet (6.25 mg total) by mouth 2 (two) times daily with a meal. 180 tablet 3   Cholecalciferol (VITAMIN D3) 2000 units capsule Take 2,000 Units by mouth daily.     clopidogrel (PLAVIX) 75 MG tablet Take 75 mg by mouth daily.     Evolocumab (REPATHA SURECLICK) 378 MG/ML SOAJ Inject 140 mg into the skin every 14 (fourteen) days. 2 mL 11   losartan (COZAAR) 25 MG tablet Take 1 tablet (25 mg total) by mouth daily. 90 tablet 3   metFORMIN (GLUCOPHAGE) 1000 MG  tablet Take 1 tablet (1,000 mg total) by mouth daily. Start tomorrow     Multiple Vitamins-Minerals (MULTIVITAMIN WITH MINERALS) tablet Take 1 tablet by mouth daily.     nitroGLYCERIN (NITROSTAT) 0.4 MG SL tablet Place 1 tablet (0.4 mg total) under the tongue every 5 (five) minutes as needed for chest pain. 30 tablet 0   solifenacin (VESICARE) 10 MG tablet Take 10 mg by mouth daily.     tamsulosin (FLOMAX) 0.4 MG CAPS capsule Take 0.4 mg by mouth daily.     vitamin C (ASCORBIC ACID) 500 MG tablet Take 500 mg by mouth.     No current facility-administered medications for this visit.    Allergies:   Simvastatin and Statins    Social History:  The patient  reports that he quit smoking about 4 months ago. His smoking use included pipe and cigarettes. He started smoking about 55 years ago. He has a 10.00 pack-year smoking history. He has never used smokeless tobacco. He reports current alcohol use of about 3.0 standard drinks of alcohol per week. He reports that he does not use drugs.   Family History:  The patient's family history includes Cancer in his father; Diabetes in his mother and sister; Heart disease in his brother; Heart failure in his mother; Hyperlipidemia in his mother; Hypertension in his mother.    ROS:  Please see the history of present illness.   Otherwise, review of systems are positive for none.   All other systems are reviewed and negative.    PHYSICAL EXAM: VS:  BP 128/78 (BP Location: Left Arm, Patient Position: Sitting, Cuff Size: Normal)   Pulse 68   Ht '5\' 9"'$  (1.753 m)   Wt 176 lb (79.8 kg)   SpO2 98%   BMI 25.99 kg/m  , BMI Body mass index is 25.99 kg/m. GEN: Well nourished, well developed, in no acute distress  HEENT: normal  Neck: no JVD, carotid bruits, or masses Cardiac: RRR; no murmurs, rubs, or gallops,no edema  Respiratory:  clear to auscultation bilaterally, normal work of breathing GI: soft, nontender, nondistended, + BS MS: no deformity or atrophy   Skin: warm and dry, no rash Neuro:  Strength and sensation are intact Psych: euthymic mood, full affect Vascular: Femoral pulses +2 on the right and +1 on the left.  Distal pulses are not palpable   EKG:  EKG is ordered today. The ekg ordered today demonstrates normal sinus rhythm with right bundle branch block and left anterior fascicular block.   Recent Labs: 02/08/2022: B Natriuretic Peptide 137.6 02/12/2022: BUN 16; Creatinine, Ser 1.07; Hemoglobin 12.4; Magnesium 2.2; Platelets 248; Potassium 3.7; Sodium 135 03/13/2022: ALT 11    Lipid Panel    Component Value Date/Time   CHOL 142 03/13/2022 0941   CHOL 231 (H) 08/21/2015 0937   TRIG 53 03/13/2022 0941   HDL 49 03/13/2022 0941   HDL 52 08/21/2015 5885  CHOLHDL 2.9 03/13/2022 0941   VLDL 11 03/13/2022 0941   LDLCALC 82 03/13/2022 0941   LDLCALC 130 (H) 12/26/2017 1534      Wt Readings from Last 3 Encounters:  07/02/22 176 lb (79.8 kg)  06/06/22 175 lb 9.6 oz (79.7 kg)  03/13/22 174 lb 3.2 oz (79 kg)          09/10/2017    3:17 PM  PAD Screen  Previous PAD dx? No  Previous surgical procedure? No  Pain with walking? No  Feet/toe relief with dangling? No  Painful, non-healing ulcers? No  Extremities discolored? No      ASSESSMENT AND PLAN:  1.  Peripheral arterial disease with moderate to severe bilateral leg claudication: Bilateral SFAs seem to be occluded.  He has more symptoms on the left than the right likely due to significant stenosis in the left external iliac artery.  I discussed with him the natural history and management of claudication.  His symptoms do not seem to be lifestyle limiting at this point.  I advised him to start a walking exercise program.  Reevaluate symptoms in few months.  If no improvement in left leg claudication, can consider proceeding with angiography and endovascular intervention.  2.  Coronary artery disease involving native coronary arteries: He is doing well with no anginal  symptoms since PCI.  Continue dual antiplatelet therapy.  3.  Essential hypertension: Blood pressure is well controlled on current medications.  4.  Hyperlipidemia: History of intolerance to statins due to myalgia but he is doing well with Repatha.  His LDL improved to 82.    Disposition:   FU with me in 4 months  Signed,  Kathlyn [place], MD  07/03/2022 12:56 PM    [place]

## 2022-07-02 NOTE — Patient Instructions (Signed)
Medication Instructions:  Your physician recommends that you continue on your current medications as directed. Please refer to the Current Medication list given to you today.  *If you need a refill on your cardiac medications before your next appointment, please call your pharmacy*   Lab Work: None ordered If you have labs (blood work) drawn today and your tests are completely normal, you will receive your results only by: [place] (if you have MyChart) OR A paper copy in the mail If you have any lab test that is abnormal or we need to change your treatment, we will call you to review the results.   Testing/Procedures: None ordered   Follow-Up: At [facility], you and your health needs are our priority.  As part of our continuing mission to provide you with exceptional heart care, we have created designated Provider Care Teams.  These Care Teams include your primary Cardiologist (physician) and Advanced Practice Providers (APPs -  Physician Assistants and Nurse Practitioners) who all work together to provide you with the care you need, when you need it.  We recommend signing up for the patient portal called "MyChart".  Sign up information is provided on this After Visit Summary.  MyChart is used to connect with patients for Virtual Visits (Telemedicine).  Patients are able to view lab/test results, encounter notes, upcoming appointments, etc.  Non-urgent messages can be sent to your provider as well.   To learn more about what you can do with MyChart, go to [email or web address].    Your next appointment:   4 month(s)  The format for your next appointment:   In Person  Provider:   Kathlyn [place], MD     Other Instructions   EXERCISE PROGRAM FOR INDIVIDUALS WITH  PERIPHERAL ARTERIAL DISEASE (PAD)   General Information:   Research in vascular exercise has demonstrated remarkable improvement in symptoms of leg pain (claudication) without expensive or  invasive interventions. Regular walking programs are extremely helpful for patients with PAD and intermittent claudication.  These steps are designed to help you get started with a safe and effective program to help you walk farther with less pain:   Walk at least three times a week (preferably every day).  Your goal is to build up to 30-45 minutes of total walking time (not counting rest breaks). It may take you several weeks to build up your exercise time starting at 5-10 minutes or whatever you can tolerate.  Walk as far as possible using moderate to maximal pain (7-8 on the scale below) as a signal to stop, and resume walking when the pain goes away.  On a treadmill, set the speed and grade at a level that brings on the claudication pain within 3 to 5 minutes. Walk at this rate until you experience claudication of moderate severity, rest until the pain improves, and then resume walking.  Over time, you will be able to walk longer at the designated speed and grade; workload should then be increased until you develop the pain within 3 to 5 minutes once again.  This regimen will induce a significant benefit. Studies have demonstrated that participants may be able to walk up to three or four times farther and have less leg pain, within twelve weeks, by following this protocol.  Pain Scale    0_____1_____2_____3_____4_____5_____6_____7_____8_____9_____10   No Pain  Moderate Pain                               Maximal Pain   Steps to Quit Smoking Smoking tobacco is the leading cause of preventable death. It can affect almost every organ in the body. Smoking puts you and people around you at risk for many serious, long-lasting (chronic) diseases. Quitting smoking can be hard, but it is one of the best things that you can do for your health. It is never too late to quit. Do not give up if you cannot quit the first time. Some people need to try many times to quit. Do your  best to stick to your quit plan, and talk with your doctor if you have any questions or concerns. How do I get ready to quit? Pick a date to quit. Set a date within the next 2 weeks to give you time to prepare. Write down the reasons why you are quitting. Keep this list in places where you will see it often. Tell your family, friends, and co-workers that you are quitting. Their support is important. Talk with your doctor about the choices that may help you quit. Find out if your health insurance will pay for these treatments. Know the people, places, things, and activities that make you want to smoke (triggers). Avoid them. What first steps can I take to quit smoking? Throw away all cigarettes at home, at work, and in your car. Throw away the things that you use when you smoke, such as ashtrays and lighters. Clean your car. Empty the ashtray. Clean your home, including curtains and carpets. What can I do to help me quit smoking? Talk with your doctor about taking medicines and seeing a counselor. You are more likely to succeed when you do both. If you are pregnant or breastfeeding: Talk with your doctor about counseling or other ways to quit smoking. Do not take medicine to help you quit smoking unless your doctor tells you to. Quit right away Quit smoking completely, instead of slowly cutting back on how much you smoke over a period of time. Stopping smoking right away may be more successful than slowly quitting. Go to counseling. In-person is best if this is an option. You are more likely to quit if you go to counseling sessions regularly. Take medicine You may take medicines to help you quit. Some medicines need a prescription, and some you can buy over-the-counter. Some medicines may contain a drug called nicotine to replace the nicotine in cigarettes. Medicines may: Help you stop having the desire to smoke (cravings). Help to stop the problems that come when you stop smoking (withdrawal  symptoms). Your doctor may ask you to use: Nicotine patches, gum, or lozenges. Nicotine inhalers or sprays. Non-nicotine medicine that you take by mouth. Find resources Find resources and other ways to help you quit smoking and remain smoke-free after you quit. They include: Online chats with a Social worker. Phone quitlines. Printed Furniture conservator/restorer. Support groups or group counseling. Text messaging programs. Mobile phone apps. Use apps on your mobile phone or tablet that can help you stick to your quit plan. Examples of free services include Quit Guide from the CDC and smokefree.gov  What can I do to make it easier to quit?  Talk to your family and friends. Ask them to support and encourage you. Call a phone quitline, such as 1-800-QUIT-NOW, reach out to support groups, or work  with a Social worker. Ask people who smoke to not smoke around you. Avoid places that make you want to smoke, such as: Bars. Parties. Smoke-break areas at work. Spend time with people who do not smoke. Lower the stress in your life. Stress can make you want to smoke. Try these things to lower stress: Getting regular exercise. Doing deep-breathing exercises. Doing yoga. Meditating. What benefits will I see if I quit smoking? Over time, you may have: A better sense of smell and taste. Less coughing and sore throat. A slower heart rate. Lower blood pressure. Clearer skin. Better breathing. Fewer sick days. Summary Quitting smoking can be hard, but it is one of the best things that you can do for your health. Do not give up if you cannot quit the first time. Some people need to try many times to quit. When you decide to quit smoking, make a plan to help you succeed. Quit smoking right away, not slowly over a period of time. When you start quitting, get help and support to keep you smoke-free. This information is not intended to replace advice given to you by your health care provider. Make sure you  discuss any questions you have with your health care provider. Document Revised: 09/07/2021 Document Reviewed: 09/07/2021 Elsevier Patient Education  [place].

## 2022-07-19 ENCOUNTER — Other Ambulatory Visit: Payer: Self-pay | Admitting: Internal Medicine

## 2022-07-19 DIAGNOSIS — I739 Peripheral vascular disease, unspecified: Secondary | ICD-10-CM

## 2022-07-26 ENCOUNTER — Ambulatory Visit: Payer: Medicare Other | Admitting: Nurse Practitioner

## 2022-10-29 ENCOUNTER — Telehealth: Payer: Self-pay | Admitting: Cardiovascular Disease

## 2022-10-29 NOTE — Telephone Encounter (Signed)
Patient states he is returning a call, but does not know who called or what it was regarding.

## 2022-11-01 ENCOUNTER — Ambulatory Visit: Payer: Medicare Other | Admitting: Cardiovascular Disease

## 2022-11-08 ENCOUNTER — Ambulatory Visit: Payer: Medicare Other | Admitting: Cardiovascular Disease

## 2022-11-08 ENCOUNTER — Ambulatory Visit: Payer: Medicare Other | Attending: Cardiovascular Disease | Admitting: Cardiovascular Disease

## 2022-11-08 ENCOUNTER — Encounter: Payer: Self-pay | Admitting: Cardiovascular Disease

## 2022-11-08 VITALS — BP 130/80 | HR 57 | Ht 69.0 in | Wt 180.2 lb

## 2022-11-08 DIAGNOSIS — I739 Peripheral vascular disease, unspecified: Secondary | ICD-10-CM

## 2022-11-08 DIAGNOSIS — Z720 Tobacco use: Secondary | ICD-10-CM

## 2022-11-08 DIAGNOSIS — I2510 Atherosclerotic heart disease of native coronary artery without angina pectoris: Secondary | ICD-10-CM | POA: Diagnosis not present

## 2022-11-08 DIAGNOSIS — I10 Essential (primary) hypertension: Secondary | ICD-10-CM

## 2022-11-08 DIAGNOSIS — E785 Hyperlipidemia, unspecified: Secondary | ICD-10-CM | POA: Diagnosis not present

## 2022-11-08 NOTE — Patient Instructions (Signed)
Medication Instructions:  No changes *If you need a refill on your cardiac medications before your next appointment, please call your pharmacy*   Lab Work: None ordered If you have labs (blood work) drawn today and your tests are completely normal, you will receive your results only by: [place] (if you have MyChart) OR A paper copy in the mail If you have any lab test that is abnormal or we need to change your treatment, we will call you to review the results.   Testing/Procedures: None ordered   Follow-Up: At [facility], you and your health needs are our priority.  As part of our continuing mission to provide you with exceptional heart care, we have created designated Provider Care Teams.  These Care Teams include your primary Cardiologist (physician) and Advanced Practice Providers (APPs -  Physician Assistants and Nurse Practitioners) who all work together to provide you with the care you need, when you need it.  We recommend signing up for the patient portal called "MyChart".  Sign up information is provided on this After Visit Summary.  MyChart is used to connect with patients for Virtual Visits (Telemedicine).  Patients are able to view lab/test results, encounter notes, upcoming appointments, etc.  Non-urgent messages can be sent to your provider as well.   To learn more about what you can do with MyChart, go to [email or web address].    Your next appointment:   6 month(s)  Provider:   You may see Dr. Fletcher Anon or one of the following Advanced Practice Providers on your designated Care Team:   Murray Hodgkins, NP Christell Faith, PA-C Cadence Kathlen Mody, PA-C Gerrie Nordmann, NP .pad   Other Instructions EXERCISE PROGRAM FOR INDIVIDUALS WITH  PERIPHERAL ARTERIAL DISEASE (PAD)   General Information:   Research in vascular exercise has demonstrated remarkable improvement in symptoms of leg pain (claudication) without expensive or invasive interventions. Regular  walking programs are extremely helpful for patients with PAD and intermittent claudication.  These steps are designed to help you get started with a safe and effective program to help you walk farther with less pain:   Walk at least three times a week (preferably every day).  Your goal is to build up to 30-45 minutes of total walking time (not counting rest breaks). It may take you several weeks to build up your exercise time starting at 5-10 minutes or whatever you can tolerate.  Walk as far as possible using moderate to maximal pain (7-8 on the scale below) as a signal to stop, and resume walking when the pain goes away.  On a treadmill, set the speed and grade at a level that brings on the claudication pain within 3 to 5 minutes. Walk at this rate until you experience claudication of moderate severity, rest until the pain improves, and then resume walking.  Over time, you will be able to walk longer at the designated speed and grade; workload should then be increased until you develop the pain within 3 to 5 minutes once again.  This regimen will induce a significant benefit. Studies have demonstrated that participants may be able to walk up to three or four times farther and have less leg pain, within twelve weeks, by following this protocol.  Pain Scale    0_____1_____2_____3_____4_____5_____6_____7_____8_____9_____10   No Pain                                   Moderate  Pain                               Maximal Pain  Managing the Challenge of Quitting Smoking Quitting smoking is a physical and mental challenge. You may have cravings, withdrawal symptoms, and temptation to smoke. Before quitting, work with your health care provider to make a plan that can help you manage quitting. Making a plan before you quit may keep you from smoking when you have the urge to smoke while trying to quit. How to manage lifestyle changes Managing stress Stress can make you want to smoke, and wanting to smoke may  cause stress. It is important to find ways to manage your stress. You could try some of the following: Practice relaxation techniques. Breathe slowly and deeply, in through your nose and out through your mouth. Listen to music. Soak in a bath or take a shower. Imagine a peaceful place or vacation. Get some support. Talk with family or friends about your stress. Join a support group. Talk with a counselor or therapist. Get some physical activity. Go for a walk, run, or bike ride. Play a favorite sport. Practice yoga.  Medicines Talk with your health care provider about medicines that might help you deal with cravings and make quitting easier for you. Relationships Social situations can be difficult when you are quitting smoking. To manage this, you can: Avoid parties and other social situations where people might be smoking. Avoid alcohol. Leave right away if you have the urge to smoke. Explain to your family and friends that you are quitting smoking. Ask for support and let them know you might be a bit grumpy. Plan activities where smoking is not an option. General instructions Be aware that many people gain weight after they quit smoking. However, not everyone does. To keep from gaining weight, have a plan in place before you quit, and stick to the plan after you quit. Your plan should include: Eating healthy snacks. When you have a craving, it may help to: Eat popcorn, or try carrots, celery, or other cut vegetables. Chew sugar-free gum. Changing how you eat. Eat small portion sizes at meals. Eat 4-6 small meals throughout the day instead of 1-2 large meals a day. Be mindful when you eat. You should avoid watching television or doing other things that might distract you as you eat. Exercising regularly. Make time to exercise each day. If you do not have time for a long workout, do short bouts of exercise for 5-10 minutes several times a day. Do some form of strengthening  exercise, such as weight lifting. Do some exercise that gets your heart beating and causes you to breathe deeply, such as walking fast, running, swimming, or biking. This is very important. Drinking plenty of water or other low-calorie or no-calorie drinks. Drink enough fluid to keep your urine pale yellow.  How to recognize withdrawal symptoms Your body and mind may experience discomfort as you try to get used to not having nicotine in your system. These effects are called withdrawal symptoms. They may include: Feeling hungrier than normal. Having trouble concentrating. Feeling irritable or restless. Having trouble sleeping. Feeling depressed. Craving a cigarette. These symptoms may surprise you, but they are normal to have when quitting smoking. To manage withdrawal symptoms: Avoid places, people, and activities that trigger your cravings. Remember why you want to quit. Get plenty of sleep. Avoid coffee and other drinks that contain caffeine.  These may worsen some of your symptoms. How to manage cravings Come up with a plan for how to deal with your cravings. The plan should include the following: A definition of the specific situation you want to deal with. An activity or action you will take to replace smoking. A clear idea for how this action will help. The name of someone who could help you with this. Cravings usually last for 5-10 minutes. Consider taking the following actions to help you with your plan to deal with cravings: Keep your mouth busy. Chew sugar-free gum. Suck on hard candies or a straw. Brush your teeth. Keep your hands and body busy. Change to a different activity right away. Squeeze or play with a ball. Do an activity or a hobby, such as making bead jewelry, practicing needlepoint, or working with wood. Mix up your normal routine. Take a short exercise break. Go for a quick walk, or run up and down stairs. Focus on doing something kind or helpful for someone  else. Call a friend or family member to talk during a craving. Join a support group. Contact a quitline. Where to find support To get help or find a support group: Call the [facility]'s Smoking Quitline: 1-800-QUIT-NOW (678)422-5414) Text QUIT to SmokefreeTXTMQ:317211 Where to find more information Visit these websites to find more information on quitting smoking: U.S. Department of Health and Human Services: www.smokefree.gov American Lung Association: www.freedomfromsmoking.org Centers for Disease Control and Prevention (CDC): http://www.wolf.info/ American Heart Association: www.heart.org Contact a health care provider if: You want to change your plan for quitting. The medicines you are taking are not helping. Your eating feels out of control or you cannot sleep. You feel depressed or become very anxious. Summary Quitting smoking is a physical and mental challenge. You will face cravings, withdrawal symptoms, and temptation to smoke again. Preparation can help you as you go through these challenges. Try different techniques to manage stress, handle social situations, and prevent weight gain. You can deal with cravings by keeping your mouth busy (such as by chewing gum), keeping your hands and body busy, calling family or friends, or contacting a quitline for people who want to quit smoking. You can deal with withdrawal symptoms by avoiding places where people smoke, getting plenty of rest, and avoiding drinks that contain caffeine. This information is not intended to replace advice given to you by your health care provider. Make sure you discuss any questions you have with your health care provider. Document Revised: 09/07/2021 Document Reviewed: 09/07/2021 Elsevier Patient Education  [place].

## 2022-11-08 NOTE — Progress Notes (Signed)
Cardiology Office Note   Date:  11/08/2022   ID:  Daniel Fuentes, DOB [birth year], MRN [id number]  PCP:  System, Provider Not In  Cardiologist:   Dr. Rockey Situ  Chief Complaint  Patient presents with   4 month follow up     "Doing well." Medications reviewed by the patient verbally.       History of Present Illness: Daniel Fuentes is a 72 y.o. male who is here today for follow-up visit regarding peripheral arterial disease.   He has known history of coronary artery disease status post multivessel stenting in May 2023 after presenting with non-STEMI, essential hypertension, hyperlipidemia, type 2 diabetes, stage II chronic kidney disease, prior subdural hematoma in the setting of head trauma, abdominal aortic aneurysm and tobacco use. He is known to have abdominal aortic aneurysm with most recent duplex showed a diameter of 3.1 cm.  Lower extremity arterial Doppler in September 2023 showed an ABI of 0.6 bilaterally.  Duplex on the right showed occluded distal SFA and popliteal artery with reconstitution in the midsegment.  On the left, the distal SFA was likely also occluded but not well visualized.  He reports stable bilateral calf claudication which is equal in both sides.  His symptoms are overall mild and do not stop him from doing activities of daily living.  No rest pain or lower extremity ulceration.  He continues to smoke about 2 cigarettes a day. He denies chest pain or shortness of breath.   Past Medical History:  Diagnosis Date   CAD (coronary artery disease)    a. 01/2022 NSTEMI/PCI: LM nl, LAD 86m D1 50, D3 90, LCX 735m95/60d, OM2 80, RCA 100p/m, 8046m0d (3.5x12, 3.0x38, & 3.5x8 Onyx Frontier DES), RPAV1 90 (2.0x12 Onyx Frontier DES), RPAV2 99, RPL1 50, RPL2 99. EF 50-55%; b. 01/2022 Staged PCI: LCX (3.5x18 & 3.0x38 Onyx Frontier DES), OM2 (3.0x22 Onyx Frontier DES).   CKD (chronic kidney disease), stage II    Diabetes mellitus without complication (HCCStrawn  Hyperlipidemia     Hypertension    Ischemic cardiomyopathy    a. 01/2022 Echo: EF 50-55%, mod LVH, GRI DD, mild basal-mid inf/infsept HK. Mildly reduced RV fxn, Mildly dil RA. Mild MR. MIld-mod TR. AoV sclerosis w/o stenosis.    Past Surgical History:  Procedure Laterality Date   BRAIN SURGERY  2017   Subdural Hematoma   COLONOSCOPY WITH PROPOFOL N/A 05/30/2021   Procedure: COLONOSCOPY WITH PROPOFOL;  Surgeon: VanLin LandsmanD;  [facility];  Service: Gastroenterology;  Laterality: N/A;   CORONARY STENT INTERVENTION N/A 02/08/2022   Procedure: CORONARY STENT INTERVENTION;  Surgeon: EndNelva BushD;  [facility];  Service: Cardiovascular;  Laterality: N/A;   CORONARY STENT INTERVENTION N/A 02/11/2022   Procedure: CORONARY STENT INTERVENTION;  Surgeon: AriWellington HampshireD;  [facility];  Service: Cardiovascular;  Laterality: N/A;   LEFT HEART CATH AND CORONARY ANGIOGRAPHY N/A 02/08/2022   Procedure: LEFT HEART CATH AND CORONARY ANGIOGRAPHY;  Surgeon: EndNelva BushD;  [facility];  Service: Cardiovascular;  Laterality: N/A;     Current Outpatient Medications  Medication Sig Dispense Refill   amLODipine (NORVASC) 10 MG tablet Take 1 tablet (10 mg total) by mouth daily. 90 tablet 3   aspirin EC 81 MG tablet Take 1 tablet (81 mg total) by mouth daily. 30 tablet 0   carvedilol (COREG) 6.25 MG tablet Take 1 tablet (6.25 mg total) by mouth 2 (  two) times daily with a meal. 180 tablet 3   Cholecalciferol (VITAMIN D3) 2000 units capsule Take 2,000 Units by mouth daily.     clopidogrel (PLAVIX) 75 MG tablet Take 75 mg by mouth daily.     Evolocumab (REPATHA SURECLICK) XX123456 MG/ML SOAJ Inject 140 mg into the skin every 14 (fourteen) days. 2 mL 11   losartan (COZAAR) 25 MG tablet Take 1 tablet (25 mg total) by mouth daily. 90 tablet 3   metFORMIN (GLUCOPHAGE) 1000 MG tablet Take 1 tablet (1,000 mg total) by mouth daily. Start tomorrow      Multiple Vitamins-Minerals (MULTIVITAMIN WITH MINERALS) tablet Take 1 tablet by mouth daily.     nitroGLYCERIN (NITROSTAT) 0.4 MG SL tablet Place 1 tablet (0.4 mg total) under the tongue every 5 (five) minutes as needed for chest pain. 30 tablet 0   sildenafil (VIAGRA) 100 MG tablet Take 100 mg by mouth as needed.     solifenacin (VESICARE) 10 MG tablet Take 10 mg by mouth daily.     tamsulosin (FLOMAX) 0.4 MG CAPS capsule Take 0.4 mg by mouth daily.     vitamin C (ASCORBIC ACID) 500 MG tablet Take 500 mg by mouth.     No current facility-administered medications for this visit.    Allergies:   Simvastatin and Statins    Social History:  The patient  reports that he has been smoking pipe and cigarettes. He started smoking about 56 years ago. He has a 10.00 pack-year smoking history. He has never used smokeless tobacco. He reports current alcohol use of about 3.0 standard drinks of alcohol per week. He reports that he does not use drugs.   Family History:  The patient's family history includes Cancer in his father; Diabetes in his mother and sister; Heart disease in his brother; Heart failure in his mother; Hyperlipidemia in his mother; Hypertension in his mother.    ROS:  Please see the history of present illness.   Otherwise, review of systems are positive for none.   All other systems are reviewed and negative.    PHYSICAL EXAM: VS:  BP 130/80 (BP Location: Left Arm, Patient Position: Sitting, Cuff Size: Normal)   Pulse (!) 57   Ht 5' 9"$  (1.753 m)   Wt 180 lb 4 oz (81.8 kg)   SpO2 98%   BMI 26.62 kg/m  , BMI Body mass index is 26.62 kg/m. GEN: Well nourished, well developed, in no acute distress  HEENT: normal  Neck: no JVD, carotid bruits, or masses Cardiac: RRR; no murmurs, rubs, or gallops,no edema  Respiratory:  clear to auscultation bilaterally, normal work of breathing GI: soft, nontender, nondistended, + BS MS: no deformity or atrophy  Skin: warm and dry, no rash Neuro:   Strength and sensation are intact Psych: euthymic mood, full affect    EKG:  EKG is ordered today. The ekg ordered today demonstrates sinus bradycardia with right bundle branch block and left anterior fascicular block.   Recent Labs: 02/08/2022: B Natriuretic Peptide 137.6 02/12/2022: BUN 16; Creatinine, Ser 1.07; Hemoglobin 12.4; Magnesium 2.2; Platelets 248; Potassium 3.7; Sodium 135 03/13/2022: ALT 11    Lipid Panel    Component Value Date/Time   CHOL 142 03/13/2022 0941   CHOL 231 (H) 08/21/2015 0937   TRIG 53 03/13/2022 0941   HDL 49 03/13/2022 0941   HDL 52 08/21/2015 0937   CHOLHDL 2.9 03/13/2022 0941   VLDL 11 03/13/2022 0941   LDLCALC 82 03/13/2022 0941  LDLCALC 130 (H) 12/26/2017 1534      Wt Readings from Last 3 Encounters:  11/08/22 180 lb 4 oz (81.8 kg)  07/02/22 176 lb (79.8 kg)  06/06/22 175 lb 9.6 oz (79.7 kg)          09/10/2017    3:17 PM  PAD Screen  Previous PAD dx? No  Previous surgical procedure? No  Pain with walking? No  Feet/toe relief with dangling? No  Painful, non-healing ulcers? No  Extremities discolored? No      ASSESSMENT AND PLAN:  1.  Peripheral arterial disease with moderate bilateral leg claudication: Bilateral SFAs seem to be occluded.  In spite of using the left external iliac artery, he reports that his calf claudication is equal in both legs.  His symptoms are not lifestyle limiting and thus I recommend continuing medical therapy. I discussed with him the importance of daily walking exercise program.  2.  Coronary artery disease involving native coronary arteries: He is doing well with no anginal symptoms since PCI.  Continue dual antiplatelet therapy.  3.  Essential hypertension: Blood pressure is well controlled on current medications.  4.  Hyperlipidemia: History of intolerance to statins due to myalgia but he is doing well with Repatha.  His LDL improved to 82.  He will require repeat labs upon follow-up.  5.   Tobacco use: I discussed with him the importance of smoking cessation.  He only smokes about 2 cigarettes daily.  6.  Diabetes mellitus: I advised him to follow-up with his primary care physician about this.    Disposition:   FU with me in 6 months  Signed,  Kathlyn [place], MD  11/08/2022 8:06 AM    [place]

## 2023-02-10 ENCOUNTER — Other Ambulatory Visit (HOSPITAL_COMMUNITY): Payer: Self-pay

## 2023-02-11 ENCOUNTER — Other Ambulatory Visit (HOSPITAL_COMMUNITY): Payer: Self-pay

## 2023-02-11 ENCOUNTER — Telehealth: Payer: Self-pay

## 2023-02-11 NOTE — Telephone Encounter (Signed)
Pharmacy Patient Advocate Encounter   Received notification from [facility] that prior authorization for REPATHA 140MG /ML is required/requested.   PA submitted on 5.14.24 to (ins) [employer] via CoverMyMeds Key or (Medicaid) confirmation # [id number]  Status is pending

## 2023-02-12 NOTE — Telephone Encounter (Signed)
Pharmacy Patient Advocate Encounter  Prior Authorization for REPATHA 140MG /ML has been approved by [employer] (ins).    KEY# [id number]  Effective dates: 2.14.24 through 5.15.25

## 2023-02-21 ENCOUNTER — Other Ambulatory Visit: Payer: Self-pay | Admitting: Nurse Practitioner

## 2023-04-06 ENCOUNTER — Other Ambulatory Visit: Payer: Self-pay | Admitting: Nurse Practitioner

## 2023-04-07 NOTE — Telephone Encounter (Signed)
Patient is scheduled for 8/30 .

## 2023-04-07 NOTE — Telephone Encounter (Signed)
Please schedule office visit for further refills. Thank you! 

## 2023-04-09 ENCOUNTER — Telehealth: Payer: Self-pay | Admitting: *Deleted

## 2023-04-09 NOTE — Telephone Encounter (Signed)
Called the patient concerning his message below:  I fear that I may have a blockage in my left leg.   The patient stated that he has been having feelings of fatigue and occasional numbness in his left calf when he ambulates. He denies pain. He denies symptoms in his right leg.   LEA and ABI are due in October.

## 2023-04-10 NOTE — Telephone Encounter (Signed)
He is known to have significant blockages in both legs which is worse on the left side.  Given that he is having symptoms now, he will likely require an angiogram.  Please schedule a follow-up visit with me within 1 to 2 weeks.

## 2023-04-10 NOTE — Telephone Encounter (Signed)
Appointment made for 7/23 with Dr. Kirke Corin. He will call back if he has worsening symptoms.

## 2023-04-22 ENCOUNTER — Encounter: Payer: Self-pay | Admitting: Cardiovascular Disease

## 2023-04-22 ENCOUNTER — Ambulatory Visit: Payer: Medicare Other | Attending: Cardiovascular Disease | Admitting: Cardiovascular Disease

## 2023-04-22 VITALS — BP 122/78 | HR 71 | Ht 69.0 in | Wt 167.4 lb

## 2023-04-22 DIAGNOSIS — E785 Hyperlipidemia, unspecified: Secondary | ICD-10-CM | POA: Diagnosis not present

## 2023-04-22 DIAGNOSIS — I10 Essential (primary) hypertension: Secondary | ICD-10-CM | POA: Diagnosis not present

## 2023-04-22 DIAGNOSIS — I2510 Atherosclerotic heart disease of native coronary artery without angina pectoris: Secondary | ICD-10-CM

## 2023-04-22 DIAGNOSIS — E119 Type 2 diabetes mellitus without complications: Secondary | ICD-10-CM

## 2023-04-22 DIAGNOSIS — I739 Peripheral vascular disease, unspecified: Secondary | ICD-10-CM | POA: Diagnosis not present

## 2023-04-22 DIAGNOSIS — Z720 Tobacco use: Secondary | ICD-10-CM

## 2023-04-22 NOTE — Progress Notes (Signed)
Cardiology Office Note   Date:  04/22/2023   ID:  Daniel Fuentes, DOB [birth year], MRN [id number]  PCP:  System, Provider Not In  Cardiologist:   Dr. Mariah Milling  Chief Complaint  Patient presents with   Follow-up    Patient having feelings of fatigue and occasional numbness in his left leg while ambulating.      History of Present Illness: Daniel Fuentes is a 72 y.o. male who is here today for follow-up visit regarding peripheral arterial disease.   He has known history of coronary artery disease status post multivessel stenting in May 2023 after presenting with non-STEMI, essential hypertension, hyperlipidemia, type 2 diabetes, stage II chronic kidney disease, prior subdural hematoma in the setting of head trauma, abdominal aortic aneurysm and tobacco use. He is known to have abdominal aortic aneurysm with most recent duplex showed a diameter of 3.1 cm.  Lower extremity arterial Doppler in September 2023 showed an ABI of 0.6 bilaterally.  Duplex on the right showed occluded distal SFA and popliteal artery with reconstitution in the midsegment.  On the left, the distal SFA was likely also occluded but not well visualized.  In addition, there was evidence of severe stenosis in the mid left external iliac artery with aneurysmal dilatation at 1.5 cm.  Reports worsening left leg claudication which happens after walking about half a mile.  It does not happen at shorter distances she had no rest pain or lower extremity ulceration.  He does have some symptoms affecting the right calf but very minimal overall.  No chest pain or shortness of breath.   Past Medical History:  Diagnosis Date   CAD (coronary artery disease)    a. 01/2022 NSTEMI/PCI: LM nl, LAD 58m, D1 50, D3 90, LCX 9m, 95/60d, OM2 80, RCA 100p/m, 75m, 70d (3.5x12, 3.0x38, & 3.5x8 Onyx Frontier DES), RPAV1 90 (2.0x12 Onyx Frontier DES), RPAV2 99, RPL1 50, RPL2 99. EF 50-55%; b. 01/2022 Staged PCI: LCX (3.5x18 & 3.0x38 Onyx Frontier DES),  OM2 (3.0x22 Onyx Frontier DES).   CKD (chronic kidney disease), stage II    Diabetes mellitus without complication (HCC)    Hyperlipidemia    Hypertension    Ischemic cardiomyopathy    a. 01/2022 Echo: EF 50-55%, mod LVH, GRI DD, mild basal-mid inf/infsept HK. Mildly reduced RV fxn, Mildly dil RA. Mild MR. MIld-mod TR. AoV sclerosis w/o stenosis.    Past Surgical History:  Procedure Laterality Date   BRAIN SURGERY  2017   Subdural Hematoma   COLONOSCOPY WITH PROPOFOL N/A 05/30/2021   Procedure: COLONOSCOPY WITH PROPOFOL;  Surgeon: Toney Reil, MD;  [facility];  Service: Gastroenterology;  Laterality: N/A;   CORONARY STENT INTERVENTION N/A 02/08/2022   Procedure: CORONARY STENT INTERVENTION;  Surgeon: Yvonne Kendall, MD;  Location: [facility];  Service: Cardiovascular;  Laterality: N/A;   CORONARY STENT INTERVENTION N/A 02/11/2022   Procedure: CORONARY STENT INTERVENTION;  Surgeon: Iran Ouch, MD;  Location: [facility] CV LAB;  Service: Cardiovascular;  Laterality: N/A;   LEFT HEART CATH AND CORONARY ANGIOGRAPHY N/A 02/08/2022   Procedure: LEFT HEART CATH AND CORONARY ANGIOGRAPHY;  Surgeon: Yvonne Kendall, MD;  Location: [facility] INVASIVE CV LAB;  Service: Cardiovascular;  Laterality: N/A;     Current Outpatient Medications  Medication Sig Dispense Refill   amLODipine (NORVASC) 10 MG tablet Take 1 tablet by mouth once daily 90 tablet 0   aspirin EC 81 MG tablet Take 1 tablet (81 mg total) by mouth daily. 30  tablet 0   carvedilol (COREG) 6.25 MG tablet TAKE 1 TABLET BY MOUTH TWICE DAILY WITH A MEAL 60 tablet 1   Cholecalciferol (VITAMIN D3) 2000 units capsule Take 2,000 Units by mouth daily.     clopidogrel (PLAVIX) 75 MG tablet Take 1 tablet by mouth once daily with breakfast 30 tablet 1   Evolocumab (REPATHA SURECLICK) 140 MG/ML SOAJ Inject 140 mg into the skin every 14 (fourteen) days. 2 mL 11   losartan (COZAAR) 25 MG tablet Take 1 tablet by  mouth once daily 30 tablet 1   metFORMIN (GLUCOPHAGE) 1000 MG tablet Take 1 tablet (1,000 mg total) by mouth daily. Start tomorrow     Multiple Vitamins-Minerals (MULTIVITAMIN WITH MINERALS) tablet Take 1 tablet by mouth daily.     nitroGLYCERIN (NITROSTAT) 0.4 MG SL tablet Place 1 tablet (0.4 mg total) under the tongue every 5 (five) minutes as needed for chest pain. 30 tablet 0   solifenacin (VESICARE) 10 MG tablet Take 10 mg by mouth daily.     tamsulosin (FLOMAX) 0.4 MG CAPS capsule Take 0.4 mg by mouth daily.     vitamin C (ASCORBIC ACID) 500 MG tablet Take 500 mg by mouth.     No current facility-administered medications for this visit.    Allergies:   Simvastatin and Statins    Social History:  The patient  reports that he has been smoking pipe and cigarettes. He started smoking about 56 years ago. He has a 11.1 pack-year smoking history. He has never used smokeless tobacco. He reports current alcohol use of about 3.0 standard drinks of alcohol per week. He reports that he does not use drugs.   Family History:  The patient's family history includes Cancer in his father; Diabetes in his mother and sister; Heart disease in his brother; Heart failure in his mother; Hyperlipidemia in his mother; Hypertension in his mother.    ROS:  Please see the history of present illness.   Otherwise, review of systems are positive for none.   All other systems are reviewed and negative.    PHYSICAL EXAM: VS:  BP 122/78 (BP Location: Left Arm, Patient Position: Sitting, Cuff Size: Normal)   Pulse 71   Ht 5\' 9"  (1.753 m)   Wt 167 lb 6.4 oz (75.9 kg)   SpO2 99%   BMI 24.72 kg/m  , BMI Body mass index is 24.72 kg/m. GEN: Well nourished, well developed, in no acute distress  HEENT: normal  Neck: no JVD, carotid bruits, or masses Cardiac: RRR; no murmurs, rubs, or gallops,no edema  Respiratory:  clear to auscultation bilaterally, normal work of breathing GI: soft, nontender, nondistended, +  BS MS: no deformity or atrophy  Skin: warm and dry, no rash Neuro:  Strength and sensation are intact Psych: euthymic mood, full affect Vascular: Femoral pulse: +2 on the right and +1 on the left.  Distal pulses are not palpable.    EKG:  EKG is not ordered today.   Recent Labs: No results found for requested labs within last 365 days.    Lipid Panel    Component Value Date/Time   CHOL 142 03/13/2022 0941   CHOL 231 (H) 08/21/2015 0937   TRIG 53 03/13/2022 0941   HDL 49 03/13/2022 0941   HDL 52 08/21/2015 0937   CHOLHDL 2.9 03/13/2022 0941   VLDL 11 03/13/2022 0941   LDLCALC 82 03/13/2022 0941   LDLCALC 130 (H) 12/26/2017 1534      Wt Readings from Last  3 Encounters:  04/22/23 167 lb 6.4 oz (75.9 kg)  11/08/22 180 lb 4 oz (81.8 kg)  07/02/22 176 lb (79.8 kg)          09/10/2017    3:17 PM  PAD Screen  Previous PAD dx? No  Previous surgical procedure? No  Pain with walking? No  Feet/toe relief with dangling? No  Painful, non-healing ulcers? No  Extremities discolored? No      ASSESSMENT AND PLAN:  1.  Peripheral arterial disease with worsening left leg claudication likely due to progression of stenosis in the left external iliac artery.  He is known to have bilateral SFA occlusion.  I discussed with him management options of claudication and his symptoms do not seem to be lifestyle limiting at this time.  I advised him to start a walking exercise program and provided him with instructions.  Angiography and endovascular intervention can be considered for worsening symptoms.    2.  Coronary artery disease involving native coronary arteries: He is doing well with no anginal symptoms since PCI.  Continue dual antiplatelet therapy.  3.  Essential hypertension: Blood pressure is well controlled on current medications.  4.  Hyperlipidemia: History of intolerance to statins due to myalgia but he is doing well with Repatha.  His LDL improved to 82.   5.  Tobacco  use: He smokes few cigarettes a day.  Smoking cessation previously discussed with the patient.  6.  Diabetes mellitus: Followed by his primary care physician.    Disposition:   FU with me in 6 months  Signed,  Lorine Bears, MD  04/22/2023 4:43 PM    [facility]

## 2023-04-22 NOTE — Patient Instructions (Signed)
Medication Instructions:  No changes *If you need a refill on your cardiac medications before your next appointment, please call your pharmacy*   Lab Work: None ordered If you have labs (blood work) drawn today and your tests are completely normal, you will receive your results only by: [place] (if you have MyChart) OR A paper copy in the mail If you have any lab test that is abnormal or we need to change your treatment, we will call you to review the results.   Testing/Procedures: None ordered   Follow-Up: At [facility], you and your health needs are our priority.  As part of our continuing mission to provide you with exceptional heart care, we have created designated Provider Care Teams.  These Care Teams include your primary Cardiologist (physician) and Advanced Practice Providers (APPs -  Physician Assistants and Nurse Practitioners) who all work together to provide you with the care you need, when you need it.  We recommend signing up for the patient portal called "MyChart".  Sign up information is provided on this After Visit Summary.  MyChart is used to connect with patients for Virtual Visits (Telemedicine).  Patients are able to view lab/test results, encounter notes, upcoming appointments, etc.  Non-urgent messages can be sent to your provider as well.   To learn more about what you can do with MyChart, go to [email or web address].    Your next appointment:   6 month(s)  Provider:   Dr. Fletcher Anon  Other Instructions [place] (PAD)   General Information:   Research in vascular exercise has demonstrated remarkable improvement in symptoms of leg pain (claudication) without expensive or invasive interventions. Regular walking programs are extremely helpful for patients with PAD and intermittent claudication.  These steps are designed to help you get started with a safe and effective program to help  you walk farther with less pain:   Walk at least three times a week (preferably every day).  Your goal is to build up to 30-45 minutes of total walking time (not counting rest breaks). It may take you several weeks to build up your exercise time starting at 5-10 minutes or whatever you can tolerate.  Walk as far as possible using moderate to maximal pain (7-8 on the scale below) as a signal to stop, and resume walking when the pain goes away.  On a treadmill, set the speed and grade at a level that brings on the claudication pain within 3 to 5 minutes. Walk at this rate until you experience claudication of moderate severity, rest until the pain improves, and then resume walking.  Over time, you will be able to walk longer at the designated speed and grade; workload should then be increased until you develop the pain within 3 to 5 minutes once again.  This regimen will induce a significant benefit. Studies have demonstrated that participants may be able to walk up to three or four times farther and have less leg pain, within twelve weeks, by following this protocol.  Pain Scale    0_____1_____2_____3_____4_____5_____6_____7_____8_____9_____10   No Pain                                   Moderate Pain                               Maximal  Pain

## 2023-05-26 ENCOUNTER — Other Ambulatory Visit: Payer: Self-pay | Admitting: Nurse Practitioner

## 2023-05-30 ENCOUNTER — Ambulatory Visit: Payer: Medicare Other | Attending: Nurse Practitioner | Admitting: Nurse Practitioner

## 2023-05-30 ENCOUNTER — Other Ambulatory Visit: Payer: Self-pay | Admitting: Cardiovascular Disease

## 2023-09-29 IMAGING — CR DG CHEST 2V
1 series · 2 of 2 positions shown · non-contrast
Comparison: CT chest 06/11/2017.

CLINICAL DATA: Chest pain.

EXAM:
CHEST - 2 VIEW

[Series 1: dg chest 2 view · 0.14mm/px · 2 of 2 slices shown]
[im 1/2]
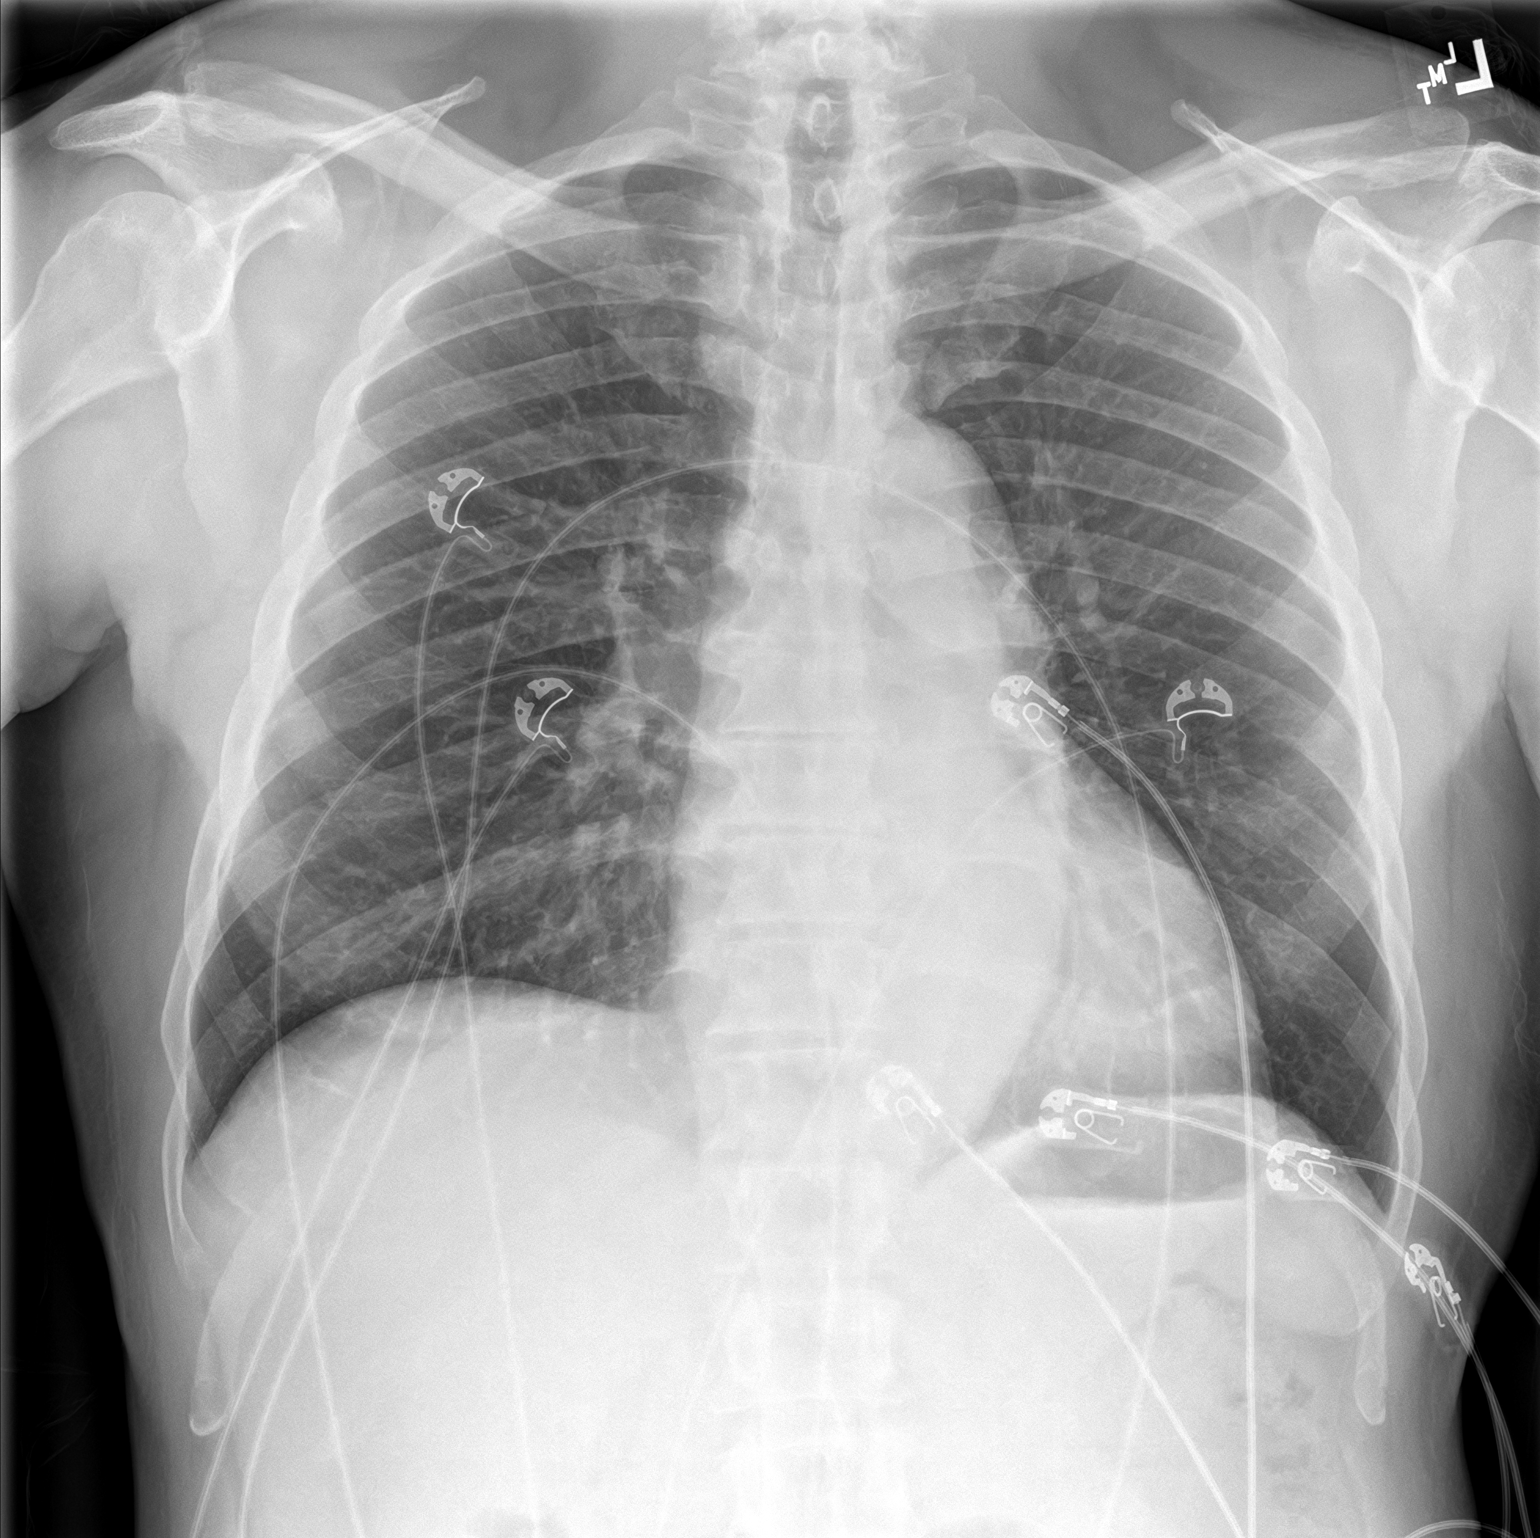
[im 2/2]
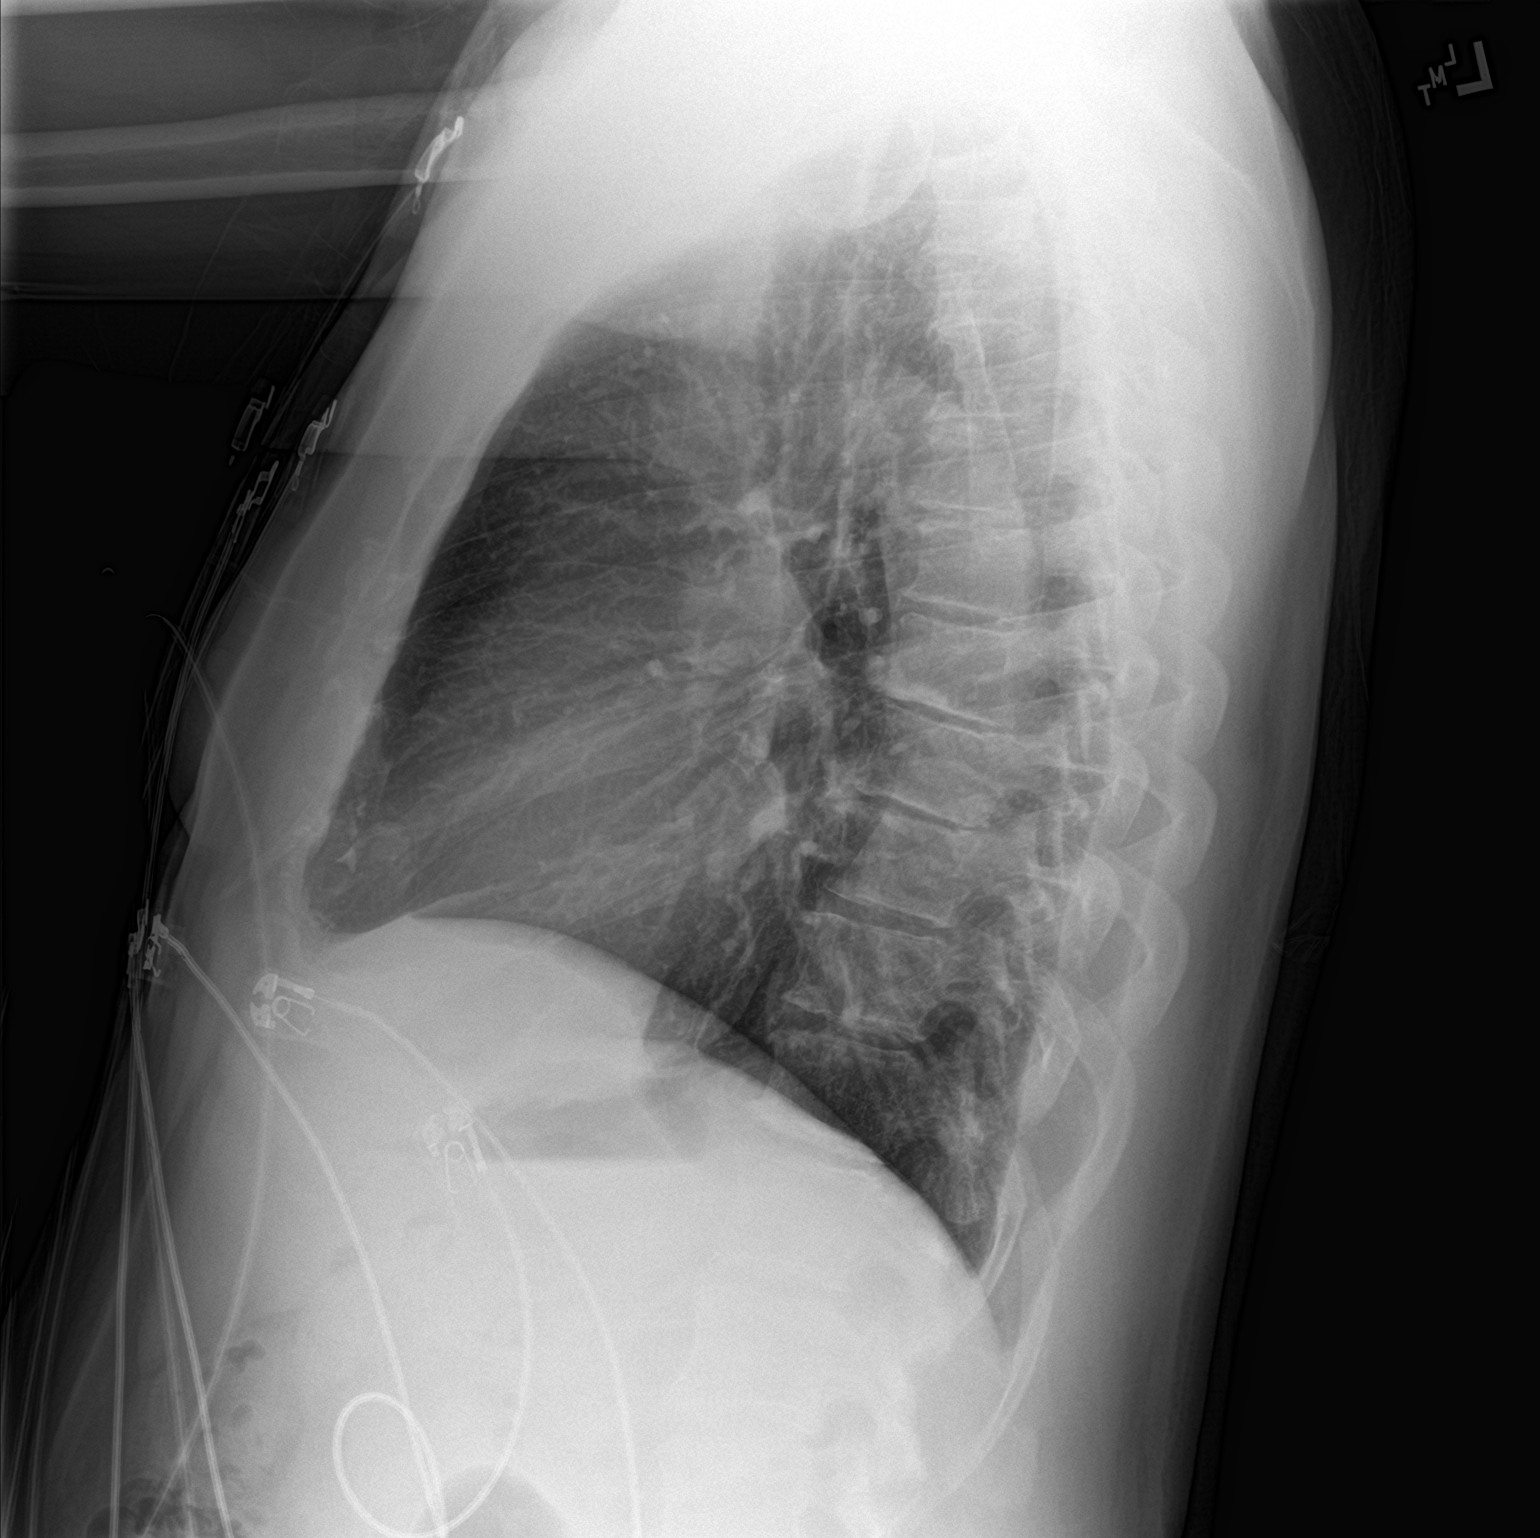

[2 of 2 positions shown; findings below may reference images not displayed]

FINDINGS: No consolidation. No visible pleural effusions or pneumothorax.
Cardiomediastinal silhouette is within normal limits. No displaced
fracture.
IMPRESSION: No evidence of acute cardiopulmonary disease.

## 2023-11-14 ENCOUNTER — Encounter: Payer: Self-pay | Admitting: Cardiovascular Disease

## 2023-11-14 ENCOUNTER — Ambulatory Visit: Payer: Medicare Other | Attending: Cardiovascular Disease | Admitting: Cardiovascular Disease

## 2023-11-14 VITALS — BP 122/84 | HR 69 | Ht 69.0 in | Wt 173.6 lb

## 2023-11-14 DIAGNOSIS — I739 Peripheral vascular disease, unspecified: Secondary | ICD-10-CM

## 2023-11-14 DIAGNOSIS — I10 Essential (primary) hypertension: Secondary | ICD-10-CM | POA: Diagnosis not present

## 2023-11-14 DIAGNOSIS — E785 Hyperlipidemia, unspecified: Secondary | ICD-10-CM | POA: Diagnosis not present

## 2023-11-14 DIAGNOSIS — I714 Abdominal aortic aneurysm, without rupture, unspecified: Secondary | ICD-10-CM

## 2023-11-14 DIAGNOSIS — I2510 Atherosclerotic heart disease of native coronary artery without angina pectoris: Secondary | ICD-10-CM

## 2023-11-14 NOTE — Patient Instructions (Signed)
Medication Instructions:  No changes *If you need a refill on your cardiac medications before your next appointment, please call your pharmacy*   Lab Work: None ordered If you have labs (blood work) drawn today and your tests are completely normal, you will receive your results only by: MyChart Message (if you have MyChart) OR A paper copy in the mail If you have any lab test that is abnormal or we need to change your treatment, we will call you to review the results.   Testing/Procedures: Your physician has requested that you have an abdominal aorta duplex. During this test, an ultrasound is used to evaluate the aorta. Allow 30 minutes for this exam. Do not eat after midnight the day before and avoid carbonated beverages.    Follow-Up: At [facility], you and your health needs are our priority.  As part of our continuing mission to provide you with exceptional heart care, we have created designated Provider Care Teams.  These Care Teams include your primary Cardiologist (physician) and Advanced Practice Providers (APPs -  Physician Assistants and Nurse Practitioners) who all work together to provide you with the care you need, when you need it.  We recommend signing up for the patient portal called "MyChart".  Sign up information is provided on this After Visit Summary.  MyChart is used to connect with patients for Virtual Visits (Telemedicine).  Patients are able to view lab/test results, encounter notes, upcoming appointments, etc.  Non-urgent messages can be sent to your provider as well.   To learn more about what you can do with MyChart, go to [email or web address].    Your next appointment:   6 month(s)  Provider:   Dr. Kirke Corin  Other Instructions A referral has been placed to Vascular rehab. They will call you to set up the first appointment.

## 2023-11-14 NOTE — Progress Notes (Signed)
Cardiology Office Note   Date:  11/14/2023   ID:  Daniel Fuentes, DOB 1951-06-20, MRN [id number]  PCP:  System, Provider Not In  Cardiologist:   Dr. Mariah Milling  Chief Complaint  Patient presents with   Follow-up    6 month follow up. Patient feels well. Medications reviewed verbally.      History of Present Illness: Daniel Fuentes is a 73 y.o. male who is here today for follow-up visit regarding peripheral arterial disease.   He has known history of coronary artery disease status post multivessel stenting in May 2023 after presenting with non-STEMI, essential hypertension, hyperlipidemia, type 2 diabetes, stage II chronic kidney disease, prior subdural hematoma in the setting of head trauma, abdominal aortic aneurysm and tobacco use. He is known to have a small abdominal aortic aneurysm .  Lower extremity arterial Doppler in September 2023 showed an ABI of 0.6 bilaterally.  Duplex on the right showed occluded distal SFA and popliteal artery with reconstitution in the midsegment.  On the left, the distal SFA was likely also occluded but not well visualized.  In addition, there was evidence of severe stenosis in the mid left external iliac artery with aneurysmal dilatation at 1.5 cm. He reports slight worsening of left leg claudication but still he does not feel it severe enough to require intervention.  He denies chest pain or shortness of breath.  His wife was with him today.   Past Medical History:  Diagnosis Date   CAD (coronary artery disease)    a. 01/2022 NSTEMI/PCI: LM nl, LAD 39m, D1 50, D3 90, LCX 34m, 95/60d, OM2 80, RCA 100p/m, 60m, 70d (3.5x12, 3.0x38, & 3.5x8 Onyx Frontier DES), RPAV1 90 (2.0x12 Onyx Frontier DES), RPAV2 99, RPL1 50, RPL2 99. EF 50-55%; b. 01/2022 Staged PCI: LCX (3.5x18 & 3.0x38 Onyx Frontier DES), OM2 (3.0x22 Onyx Frontier DES).   CKD (chronic kidney disease), stage II    Diabetes mellitus without complication (HCC)    Hyperlipidemia    Hypertension     Ischemic cardiomyopathy    a. 01/2022 Echo: EF 50-55%, mod LVH, GRI DD, mild basal-mid inf/infsept HK. Mildly reduced RV fxn, Mildly dil RA. Mild MR. MIld-mod TR. AoV sclerosis w/o stenosis.    Past Surgical History:  Procedure Laterality Date   BRAIN SURGERY  2017   Subdural Hematoma   COLONOSCOPY WITH PROPOFOL N/A 05/30/2021   Procedure: COLONOSCOPY WITH PROPOFOL;  Surgeon: Toney Reil, MD;  Location: [facility] ENDOSCOPY;  Service: Gastroenterology;  Laterality: N/A;   CORONARY STENT INTERVENTION N/A 02/08/2022   Procedure: CORONARY STENT INTERVENTION;  Surgeon: Yvonne Kendall, MD;  Location: [facility] INVASIVE CV LAB;  Service: Cardiovascular;  Laterality: N/A;   CORONARY STENT INTERVENTION N/A 02/11/2022   Procedure: CORONARY STENT INTERVENTION;  Surgeon: Iran Ouch, MD;  Location: [facility] INVASIVE CV LAB;  Service: Cardiovascular;  Laterality: N/A;   LEFT HEART CATH AND CORONARY ANGIOGRAPHY N/A 02/08/2022   Procedure: LEFT HEART CATH AND CORONARY ANGIOGRAPHY;  Surgeon: Yvonne Kendall, MD;  Location: [facility] INVASIVE CV LAB;  Service: Cardiovascular;  Laterality: N/A;     Current Outpatient Medications  Medication Sig Dispense Refill   amLODipine (NORVASC) 10 MG tablet Take 1 tablet by mouth once daily 90 tablet 0   aspirin EC 81 MG tablet Take 1 tablet (81 mg total) by mouth daily. 30 tablet 0   carvedilol (COREG) 6.25 MG tablet TAKE 1 TABLET BY MOUTH TWICE DAILY WITH A MEAL 180 tablet 3   Cholecalciferol (VITAMIN D3) 2000  units capsule Take 2,000 Units by mouth daily.     clopidogrel (PLAVIX) 75 MG tablet Take 1 tablet by mouth once daily with breakfast 90 tablet 3   Evolocumab (REPATHA SURECLICK) 140 MG/ML SOAJ Inject 140 mg into the skin every 14 (fourteen) days. 2 mL 11   losartan (COZAAR) 25 MG tablet Take 1 tablet by mouth once daily 90 tablet 3   metFORMIN (GLUCOPHAGE) 1000 MG tablet Take 1 tablet (1,000 mg total) by mouth daily. Start tomorrow     Multiple Vitamins-Minerals  (MULTIVITAMIN WITH MINERALS) tablet Take 1 tablet by mouth daily.     nitroGLYCERIN (NITROSTAT) 0.4 MG SL tablet Place 1 tablet (0.4 mg total) under the tongue every 5 (five) minutes as needed for chest pain. 30 tablet 0   solifenacin (VESICARE) 10 MG tablet Take 10 mg by mouth daily.     tamsulosin (FLOMAX) 0.4 MG CAPS capsule Take 0.4 mg by mouth daily.     vitamin C (ASCORBIC ACID) 500 MG tablet Take 500 mg by mouth.     No current facility-administered medications for this visit.    Allergies:   Simvastatin and Statins    Social History:  The patient  reports that he has been smoking pipe and cigarettes. He started smoking about 57 years ago. He has a 11.1 pack-year smoking history. He has never used smokeless tobacco. He reports current alcohol use of about 3.0 standard drinks of alcohol per week. He reports that he does not use drugs.   Family History:  The patient's family history includes Cancer in his father; Diabetes in his mother and sister; Heart disease in his brother; Heart failure in his mother; Hyperlipidemia in his mother; Hypertension in his mother.    ROS:  Please see the history of present illness.   Otherwise, review of systems are positive for none.   All other systems are reviewed and negative.    PHYSICAL EXAM: VS:  BP 122/84 (BP Location: Left Arm, Patient Position: Sitting, Cuff Size: Normal)   Pulse 69   Ht 5\' 9"  (1.753 m)   Wt 173 lb 9.6 oz (78.7 kg)   SpO2 96%   BMI 25.64 kg/m  , BMI Body mass index is 25.64 kg/m. GEN: Well nourished, well developed, in no acute distress  HEENT: normal  Neck: no JVD, carotid bruits, or masses Cardiac: RRR; no murmurs, rubs, or gallops,no edema  Respiratory:  clear to auscultation bilaterally, normal work of breathing GI: soft, nontender, nondistended, + BS MS: no deformity or atrophy  Skin: warm and dry, no rash Neuro:  Strength and sensation are intact Psych: euthymic mood, full affect Vascular: Femoral pulse: +2  on the right and +1 on the left.  Distal pulses are not palpable.    EKG:  EKG is ordered today. EKG showed: Normal sinus rhythm Right bundle branch block Left anterior fascicular block Bifascicular block When compared with ECG of 12-Feb-2022 04:39, No significant change was found   Recent Labs: No results found for requested labs within last 365 days.    Lipid Panel    Component Value Date/Time   CHOL 142 03/13/2022 0941   CHOL 231 (H) 08/21/2015 0937   TRIG 53 03/13/2022 0941   HDL 49 03/13/2022 0941   HDL 52 08/21/2015 0937   CHOLHDL 2.9 03/13/2022 0941   VLDL 11 03/13/2022 0941   LDLCALC 82 03/13/2022 0941   LDLCALC 130 (H) 12/26/2017 1534      Wt Readings from Last 3 Encounters:  11/14/23 173 lb 9.6 oz (78.7 kg)  04/22/23 167 lb 6.4 oz (75.9 kg)  11/08/22 180 lb 4 oz (81.8 kg)          09/10/2017    3:17 PM  PAD Screen  Previous PAD dx? No  Previous surgical procedure? No  Pain with walking? No  Feet/toe relief with dangling? No  Painful, non-healing ulcers? No  Extremities discolored? No      ASSESSMENT AND PLAN:  1.  Peripheral arterial disease with mild worsening left leg claudication  He is known to have bilateral SFA occlusion.  In addition, he has significant stenosis in the left external iliac artery.  I discussed with him the indication for revascularization but his symptoms are still not severe enough.   The patient was educated on risk factors surrounding PAD. I discussed the importance of controlling risk factors and importance of regular exercise to help reduce pain. I discussed the effects of exercise on reducing claudications and improving his risk factors including hyperlipidemia and hypertension.  I am referring the patient to a structured exercise program.  2.  Coronary artery disease involving native coronary arteries: He is doing well with no anginal symptoms since PCI.  Continue dual antiplatelet therapy.  3.  Essential  hypertension: Blood pressure is well controlled on current medications.  4.  Hyperlipidemia: History of intolerance to statins due to myalgia but he is doing well with Repatha.  His LDL improved to 82.  He reports financial difficulty with Repatha but I explained to him that the alternative oral medications to statins are not strong enough.  5.  Tobacco use: He smokes few cigarettes a day.  Smoking cessation previously discussed with the patient.  6.  Diabetes mellitus: Followed by his primary care physician.  Most recent hemoglobin A1c was 5.8.  7.  Abdominal aortic aneurysm: Most measurement was 3.1 cm.  I requested a follow-up ultrasound.    Disposition:   FU with me in 6 months  Signed,  Lorine Bears, MD  11/14/2023 8:23 AM    [facility]

## 2023-12-01 ENCOUNTER — Other Ambulatory Visit: Payer: Self-pay

## 2023-12-01 ENCOUNTER — Encounter: Payer: Medicare Other | Admitting: *Deleted

## 2023-12-01 ENCOUNTER — Encounter: Payer: Medicare Other | Attending: Cardiovascular Disease

## 2023-12-01 VITALS — Ht 67.75 in | Wt 173.5 lb

## 2023-12-01 DIAGNOSIS — I739 Peripheral vascular disease, unspecified: Secondary | ICD-10-CM

## 2023-12-01 DIAGNOSIS — I70213 Atherosclerosis of native arteries of extremities with intermittent claudication, bilateral legs: Secondary | ICD-10-CM | POA: Diagnosis present

## 2023-12-01 NOTE — Patient Instructions (Signed)
 Patient Instructions  Patient Details  Name: Daniel Fuentes MRN: [id number] Date of Birth: 1950-10-19 Referring Provider:  Iran Ouch, MD  Below are your personal goals for exercise, nutrition, and risk factors. Our goal is to help you stay on track towards obtaining and maintaining these goals. We will be discussing your progress on these goals with you throughout the program.  Initial Exercise Prescription:  Initial Exercise Prescription - 12/01/23 1500       Date of Initial Exercise RX and Referring Provider   Date 12/01/23    Referring Provider Dr. Lorine Bears      Oxygen   Maintain Oxygen Saturation 88% or higher      Treadmill   MPH 2    Grade 2    Minutes 15    METs 3.08      NuStep   Level 3    SPM 80    Minutes 15    METs 3      Biostep-RELP   Level 3    SPM 50    Minutes 15    METs 3      Prescription Details   Frequency (times per week) 3    Duration Progress to 30 minutes of continuous aerobic without signs/symptoms of physical distress      Intensity   THRR 40-80% of Max Heartrate 108-133    Ratings of Perceived Exertion 11-13    Perceived Dyspnea 0-4      Progression   Progression Continue to follow PAD protocol      Resistance Training   Training Prescription Yes    Weight 4    Reps 10-15             Exercise Goals: Frequency: Be able to perform aerobic exercise two to three times per week in program working toward 2-5 days per week of home exercise.  Intensity: Work with a perceived exertion of 11 (fairly light) - 15 (hard) while following your exercise prescription.  We will make changes to your prescription with you as you progress through the program.   Duration: Be able to do 30 to 45 minutes of continuous aerobic exercise in addition to a 5 minute warm-up and a 5 minute cool-down routine.   Nutrition Goals: Your personal nutrition goals will be established when you do your nutrition analysis with the dietician.  The  following are general nutrition guidelines to follow: Cholesterol < 200mg /day Sodium < 1500mg /day Fiber: Men over 50 yrs - 30 grams per day  Personal Goals:  Personal Goals and Risk Factors at Admission - 12/01/23 1558       Core Components/Risk Factors/Patient Goals on Admission    Weight Management Yes    Intervention Weight Management: Develop a combined nutrition and exercise program designed to reach desired caloric intake, while maintaining appropriate intake of nutrient and fiber, sodium and fats, and appropriate energy expenditure required for the weight goal.;Weight Management: Provide education and appropriate resources to help participant work on and attain dietary goals.;Weight Management/Obesity: Establish reasonable short term and long term weight goals.    Admit Weight 173 lb 8 oz (78.7 kg)    Goal Weight: Short Term 173 lb 8 oz (78.7 kg)    Goal Weight: Long Term 173 lb 8 oz (78.7 kg)    Expected Outcomes Short Term: Continue to assess and modify interventions until short term weight is achieved;Weight Maintenance: Understanding of the daily nutrition guidelines, which includes 25-35% calories from fat, 7% or less cal from  saturated fats, less than 200mg  cholesterol, less than 1.5gm of sodium, & 5 or more servings of fruits and vegetables daily;Understanding recommendations for meals to include 15-35% energy as protein, 25-35% energy from fat, 35-60% energy from carbohydrates, less than 200mg  of dietary cholesterol, 20-35 gm of total fiber daily;Understanding of distribution of calorie intake throughout the day with the consumption of 4-5 meals/snacks    Tobacco Cessation Yes    Intervention Assist the participant in steps to quit. Provide individualized education and counseling about committing to Tobacco Cessation, relapse prevention, and pharmacological support that can be provided by physician.;Education officer, environmental, assist with locating and accessing local/national Quit  Smoking programs, and support quit date choice.    Expected Outcomes Short Term: Will demonstrate readiness to quit, by selecting a quit date.;Short Term: Will quit all tobacco product use, adhering to prevention of relapse plan.;Long Term: Complete abstinence from all tobacco products for at least 12 months from quit date.    Diabetes Yes    Intervention Provide education about signs/symptoms and action to take for hypo/hyperglycemia.;Provide education about proper nutrition, including hydration, and aerobic/resistive exercise prescription along with prescribed medications to achieve blood glucose in normal ranges: Fasting glucose 65-99 mg/dL    Expected Outcomes Short Term: Participant verbalizes understanding of the signs/symptoms and immediate care of hyper/hypoglycemia, proper foot care and importance of medication, aerobic/resistive exercise and nutrition plan for blood glucose control.;Long Term: Attainment of HbA1C < 7%.    Hypertension Yes    Intervention Provide education on lifestyle modifcations including regular physical activity/exercise, weight management, moderate sodium restriction and increased consumption of fresh fruit, vegetables, and low fat dairy, alcohol moderation, and smoking cessation.;Monitor prescription use compliance.    Expected Outcomes Short Term: Continued assessment and intervention until BP is < 140/48mm HG in hypertensive participants. < 130/17mm HG in hypertensive participants with diabetes, heart failure or chronic kidney disease.;Long Term: Maintenance of blood pressure at goal levels.    Lipids Yes    Intervention Provide education and support for participant on nutrition & aerobic/resistive exercise along with prescribed medications to achieve LDL 70mg , HDL >40mg .    Expected Outcomes Short Term: Participant states understanding of desired cholesterol values and is compliant with medications prescribed. Participant is following exercise prescription and nutrition  guidelines.;Long Term: Cholesterol controlled with medications as prescribed, with individualized exercise RX and with personalized nutrition plan. Value goals: LDL < 70mg , HDL > 40 mg.             Tobacco Use Initial Evaluation: Social History   Tobacco Use  Smoking Status Every Day   Current packs/day: 0.00   Average packs/day: 0.2 packs/day for 55.4 years (11.1 ttl pk-yrs)   Types: Pipe, Cigarettes   Start date: 09/04/1966   Last attempt to quit: 02/08/2022   Years since quitting: 1.8  Smokeless Tobacco Never  Tobacco Comments   Smokes 7 cigarettes in a week.     Exercise Goals and Review:  Exercise Goals     Row Name 12/01/23 1556             Exercise Goals   Increase Physical Activity Yes       Intervention Provide advice, education, support and counseling about physical activity/exercise needs.;Develop an individualized exercise prescription for aerobic and resistive training based on initial evaluation findings, risk stratification, comorbidities and participant's personal goals.       Expected Outcomes Short Term: Attend rehab on a regular basis to increase amount of physical activity.;Long Term: Add in  home exercise to make exercise part of routine and to increase amount of physical activity.;Long Term: Exercising regularly at least 3-5 days a week.       Increase Strength and Stamina Yes       Intervention Provide advice, education, support and counseling about physical activity/exercise needs.;Develop an individualized exercise prescription for aerobic and resistive training based on initial evaluation findings, risk stratification, comorbidities and participant's personal goals.       Expected Outcomes Short Term: Increase workloads from initial exercise prescription for resistance, speed, and METs.;Short Term: Perform resistance training exercises routinely during rehab and add in resistance training at home;Long Term: Improve cardiorespiratory fitness, muscular  endurance and strength as measured by increased METs and functional capacity ( )       Able to understand and use rate of perceived exertion (RPE) scale Yes       Intervention Provide education and explanation on how to use RPE scale       Expected Outcomes Short Term: Able to use RPE daily in rehab to express subjective intensity level;Long Term:  Able to use RPE to guide intensity level when exercising independently       Able to understand and use Dyspnea scale Yes       Intervention Provide education and explanation on how to use Dyspnea scale       Expected Outcomes Short Term: Able to use Dyspnea scale daily in rehab to express subjective sense of shortness of breath during exertion;Long Term: Able to use Dyspnea scale to guide intensity level when exercising independently       Knowledge and understanding of Target Heart Rate Range (THRR) Yes       Intervention Provide education and explanation of THRR including how the numbers were predicted and where they are located for reference       Expected Outcomes Short Term: Able to state/look up THRR;Long Term: Able to use THRR to govern intensity when exercising independently;Short Term: Able to use daily as guideline for intensity in rehab       Able to check pulse independently Yes       Intervention Review the importance of being able to check your own pulse for safety during independent exercise;Provide education and demonstration on how to check pulse in carotid and radial arteries.       Expected Outcomes Short Term: Able to explain why pulse checking is important during independent exercise;Long Term: Able to check pulse independently and accurately       Understanding of Exercise Prescription Yes       Intervention Provide education, explanation, and written materials on patient's individual exercise prescription       Expected Outcomes Short Term: Able to explain program exercise prescription;Long Term: Able to explain home exercise  prescription to exercise independently       Improve claudication pain toleration; Improve walking ability Yes       Intervention Participate in PAD/SET Rehab 2-3 days a week, walking at home as part of exercise prescription;Attend education sessions to aid in risk factor modification and understanding of disease process       Expected Outcomes Short Term: Improve walking distance/time to onset of claudication pain;Long Term: Improve score of PAD questionnaires                Copy of goals given to participant.

## 2023-12-01 NOTE — Progress Notes (Signed)
 Virtual Visit completed. Patient informed on EP and RD appointment and 6 Minute walk test. Patient also informed of patient health questionnaires on My Chart. Patient Verbalizes understanding. Visit diagnosis can be found in [facility] 11/14/2023.

## 2023-12-01 NOTE — Progress Notes (Signed)
 PAD/SET  Plan of Care  Patient Details  Name: Daniel Fuentes MRN: [id number] Date of Birth: 14-Mar-1951 Referring Provider:   Flowsheet Row Cardiac Rehab from 12/01/2023 in [facility] Cardiac and Pulmonary Rehab  Referring Provider Dr. Lorine Bears       Initial Encounter Date:  Flowsheet Row Cardiac Rehab from 12/01/2023 in [facility] Cardiac and Pulmonary Rehab  Date 12/01/23       Visit Diagnosis: PAD (peripheral artery disease) (HCC)  Patient's Home Medications on Admission:  Current Outpatient Medications:    amLODipine (NORVASC) 10 MG tablet, Take 1 tablet by mouth once daily, Disp: 90 tablet, Rfl: 0   aspirin EC 81 MG tablet, Take 1 tablet (81 mg total) by mouth daily., Disp: 30 tablet, Rfl: 0   carvedilol (COREG) 6.25 MG tablet, TAKE 1 TABLET BY MOUTH TWICE DAILY WITH A MEAL, Disp: 180 tablet, Rfl: 3   Cholecalciferol (VITAMIN D3) 2000 units capsule, Take 2,000 Units by mouth daily., Disp: , Rfl:    clopidogrel (PLAVIX) 75 MG tablet, Take 1 tablet by mouth once daily with breakfast, Disp: 90 tablet, Rfl: 3   Evolocumab (REPATHA SURECLICK) 140 MG/ML SOAJ, Inject 140 mg into the skin every 14 (fourteen) days., Disp: 2 mL, Rfl: 11   losartan (COZAAR) 25 MG tablet, Take 1 tablet by mouth once daily, Disp: 90 tablet, Rfl: 3   metFORMIN (GLUCOPHAGE) 1000 MG tablet, Take 1 tablet (1,000 mg total) by mouth daily. Start tomorrow, Disp: , Rfl:    Multiple Vitamins-Minerals (MULTIVITAMIN WITH MINERALS) tablet, Take 1 tablet by mouth daily., Disp: , Rfl:    nitroGLYCERIN (NITROSTAT) 0.4 MG SL tablet, Place 1 tablet (0.4 mg total) under the tongue every 5 (five) minutes as needed for chest pain., Disp: 30 tablet, Rfl: 0   solifenacin (VESICARE) 10 MG tablet, Take 10 mg by mouth daily., Disp: , Rfl:    tamsulosin (FLOMAX) 0.4 MG CAPS capsule, Take 0.4 mg by mouth daily., Disp: , Rfl:    vitamin C (ASCORBIC ACID) 500 MG tablet, Take 500 mg by mouth., Disp: , Rfl:   Past Medical History: Past Medical  History:  Diagnosis Date   CAD (coronary artery disease)    a. 01/2022 NSTEMI/PCI: LM nl, LAD 54m, D1 50, D3 90, LCX 74m, 95/60d, OM2 80, RCA 100p/m, 21m, 70d (3.5x12, 3.0x38, & 3.5x8 Onyx Frontier DES), RPAV1 90 (2.0x12 Onyx Frontier DES), RPAV2 99, RPL1 50, RPL2 99. EF 50-55%; b. 01/2022 Staged PCI: LCX (3.5x18 & 3.0x38 Onyx Frontier DES), OM2 (3.0x22 Onyx Frontier DES).   CKD (chronic kidney disease), stage II    Diabetes mellitus without complication (HCC)    Hyperlipidemia    Hypertension    Ischemic cardiomyopathy    a. 01/2022 Echo: EF 50-55%, mod LVH, GRI DD, mild basal-mid inf/infsept HK. Mildly reduced RV fxn, Mildly dil RA. Mild MR. MIld-mod TR. AoV sclerosis w/o stenosis.    Tobacco Use: Social History   Tobacco Use  Smoking Status Every Day   Current packs/day: 0.00   Average packs/day: 0.2 packs/day for 55.4 years (11.1 ttl pk-yrs)   Types: Pipe, Cigarettes   Start date: 09/04/1966   Last attempt to quit: 02/08/2022   Years since quitting: 1.8  Smokeless Tobacco Never  Tobacco Comments   Smokes 7 cigarettes in a week.     Labs: Review Flowsheet  More data exists      Latest Ref Rng & Units 05/27/2017 12/26/2017 05/14/2018 02/08/2022 03/13/2022  Labs for ITP Cardiac and Pulmonary Rehab  Cholestrol  0 - 200 mg/dL 161  096  - 045  409   LDL (calc) 0 - 99 mg/dL 811  914  - 782  82   HDL-C >40 mg/dL 40  37  - 48  49   Trlycerides <150 mg/dL 956  213  - 086  53   Hemoglobin A1c 4.8 - 5.6 % - 8.4  7.1  5.9  -     Exercise Target Goals: Exercise Program Goal: Individual exercise prescription set with THRR, safety & activity barriers. Participant demonstrates ability to understand and report RPE using RPE 6-20 scale, to self-measure pulse accurately, and to acknowledge the importance of the exercise prescription.  Exercise Prescription Goal: Use initial exercise assessment to set exercise prescription to improve time and distance to onset of claudication pain. Provide  education to aid in steps toward risk factor modification, improve quality of life with claudication, and utilize THRR and exercise prescription for best results for decreasing symptoms of PAD. Prevent injury to bone, joints, and skin integrity during the exercise through checks of each system during session check in. Following physician guidelines for any contraindications to specific exercises.  Activity Barriers:  Activity Barriers & Cardiac Risk Stratification - 12/01/23 1550       Activity Barriers & Cardiac Risk Stratification   Activity Barriers Other (comment)    Comments AAA BP restrictions >140/90 unless MD provides other restrictions             Gardner Treadmill Test Assessment:  Treadmill Test     Row Name 12/01/23 1546       Treadmill Test   Phase Pre    Time to Claudication Onset 4 minutes    Total Time to 3-4/5 Claudication Pain 8 minutes      Heart Rate   Rest 83 bpm    0% Grade 92 bpm    2% Grade 96 bpm    4% Grade 99 bpm    6% Grade 108 bpm    2 Min Post 74 bpm      Blood Pressure   Rest 122/80    0% 136/80    2% Grade 158/80    4% Grade 164/84    6% Grade 168/86    2 Min Post 128/82      Claudication Score   Rest 1  numbness    0% 1    2% Grade 2    4% Grade 2    6% Grade 4    2 Min Post 1      RPE   0% 13    2% Grade 13    4% Grade 13    6% Grade 15      Time in Stage   0% 2 minutes    2% Grade 2 minutes    4% Grade 2 minutes    6% Grade 2 minutes             Walking Impairment Questionnaire:   Expectation is that scores will be within the SD range. Distance Pre % Score Mean: 38% (SD 12% - 0.64%)   Post % Score Mean: 55% (SD 26% - 84%)   Mean % Change: 18% (SD (-10%) - (46%))  Speed Pre % Score Mean: 41% (SD 19% - 63%)   Post % Score Mean: 52% (SD 30% - 74%)   Mean % Change: 11% (SD (-9%) - (31%))  Stairs Pre % Score Mean: 55% (SD 23% - 87%)   Post % Score  Mean: 68% (SD 39% - 97%)   Mean % Change: 14% (SD (-15%) -  (43%))  Total Score Pre % Score Mean: 45% (SD 23% - 67%   Post % Score Mean: 58% (SD 36% - 80%)   Mean % Change: 14% (SD (-5%) - (43%))   Oxygen Initial Assessment:   Oxygen Re-Evaluation:   Oxygen Discharge (Final Oxygen Re-Evaluation):   Initial Exercise Prescription:  Initial Exercise Prescription - 12/01/23 1500       Date of Initial Exercise RX and Referring Provider   Date 12/01/23    Referring Provider Dr. Lorine Bears      Oxygen   Maintain Oxygen Saturation 88% or higher      Treadmill   MPH 2    Grade 2    Minutes 15    METs 3.08      NuStep   Level 3    SPM 80    Minutes 15    METs 3      Biostep-RELP   Level 3    SPM 50    Minutes 15    METs 3      Prescription Details   Frequency (times per week) 3    Duration Progress to 30 minutes of continuous aerobic without signs/symptoms of physical distress      Intensity   THRR 40-80% of Max Heartrate 108-133    Ratings of Perceived Exertion 11-13    Perceived Dyspnea 0-4      Progression   Progression Continue to follow PAD protocol      Resistance Training   Training Prescription Yes    Weight 4    Reps 10-15             Perform Capillary Blood Glucose checks as needed.  Exercise Prescription Changes:   Exercise Prescription Changes     Row Name 12/01/23 1500             Response to Exercise   Blood Pressure (Admit) 122/80       Blood Pressure (Exercise) 168/86       Blood Pressure (Exit) 128/82       Heart Rate (Admit) 83 bpm       Heart Rate (Exercise) 108 bpm       Heart Rate (Exit) 74 bpm       Oxygen Saturation (Admit) 98 %       Oxygen Saturation (Exercise) 97 %       Oxygen Saturation (Exit) 97 %       Rating of Perceived Exertion (Exercise) 15       Symptoms PAD pain 4/5       Comments PAD walk test                Exercise Comments:   Exercise Goals and Review:   Exercise Goals     Row Name 12/01/23 1556             Exercise Goals    Increase Physical Activity Yes       Intervention Provide advice, education, support and counseling about physical activity/exercise needs.;Develop an individualized exercise prescription for aerobic and resistive training based on initial evaluation findings, risk stratification, comorbidities and participant's personal goals.       Expected Outcomes Short Term: Attend rehab on a regular basis to increase amount of physical activity.;Long Term: Add in home exercise to make exercise part of routine and to increase amount of physical activity.;Long Term: Exercising regularly at  least 3-5 days a week.       Increase Strength and Stamina Yes       Intervention Provide advice, education, support and counseling about physical activity/exercise needs.;Develop an individualized exercise prescription for aerobic and resistive training based on initial evaluation findings, risk stratification, comorbidities and participant's personal goals.       Expected Outcomes Short Term: Increase workloads from initial exercise prescription for resistance, speed, and METs.;Short Term: Perform resistance training exercises routinely during rehab and add in resistance training at home;Long Term: Improve cardiorespiratory fitness, muscular endurance and strength as measured by increased METs and functional capacity ( )       Able to understand and use rate of perceived exertion (RPE) scale Yes       Intervention Provide education and explanation on how to use RPE scale       Expected Outcomes Short Term: Able to use RPE daily in rehab to express subjective intensity level;Long Term:  Able to use RPE to guide intensity level when exercising independently       Able to understand and use Dyspnea scale Yes       Intervention Provide education and explanation on how to use Dyspnea scale       Expected Outcomes Short Term: Able to use Dyspnea scale daily in rehab to express subjective sense of shortness of breath during  exertion;Long Term: Able to use Dyspnea scale to guide intensity level when exercising independently       Knowledge and understanding of Target Heart Rate Range (THRR) Yes       Intervention Provide education and explanation of THRR including how the numbers were predicted and where they are located for reference       Expected Outcomes Short Term: Able to state/look up THRR;Long Term: Able to use THRR to govern intensity when exercising independently;Short Term: Able to use daily as guideline for intensity in rehab       Able to check pulse independently Yes       Intervention Review the importance of being able to check your own pulse for safety during independent exercise;Provide education and demonstration on how to check pulse in carotid and radial arteries.       Expected Outcomes Short Term: Able to explain why pulse checking is important during independent exercise;Long Term: Able to check pulse independently and accurately       Understanding of Exercise Prescription Yes       Intervention Provide education, explanation, and written materials on patient's individual exercise prescription       Expected Outcomes Short Term: Able to explain program exercise prescription;Long Term: Able to explain home exercise prescription to exercise independently       Improve claudication pain toleration; Improve walking ability Yes       Intervention Participate in PAD/SET Rehab 2-3 days a week, walking at home as part of exercise prescription;Attend education sessions to aid in risk factor modification and understanding of disease process       Expected Outcomes Short Term: Improve walking distance/time to onset of claudication pain;Long Term: Improve score of PAD questionnaires                Exercise Goals Re-Evaluation :   Discharge Exercise Prescription (Final Exercise Prescription Changes):  Exercise Prescription Changes - 12/01/23 1500       Response to Exercise   Blood Pressure (Admit)  122/80    Blood Pressure (Exercise) 168/86    Blood Pressure (Exit) 128/82  Heart Rate (Admit) 83 bpm    Heart Rate (Exercise) 108 bpm    Heart Rate (Exit) 74 bpm    Oxygen Saturation (Admit) 98 %    Oxygen Saturation (Exercise) 97 %    Oxygen Saturation (Exit) 97 %    Rating of Perceived Exertion (Exercise) 15    Symptoms PAD pain 4/5    Comments PAD walk test             Nutrition:  Target Goals: Understanding of nutrition guidelines, daily intake of sodium 1500mg , cholesterol 200mg , calories 30% from fat and 7% or less from saturated fats, daily to have 5 or more servings of fruits and vegetables.  Biometrics:  Pre Biometrics - 12/01/23 1557       Pre Biometrics   Height 5' 7.75" (1.721 m)    Weight 173 lb 8 oz (78.7 kg)    Waist Circumference 37 inches    Hip Circumference 38 inches    Waist to Hip Ratio 0.97 %    BMI (Calculated) 26.57    Single Leg Stand 30 seconds              Nutrition Therapy Plan and Nutrition Goals:  Nutrition Therapy & Goals - 12/01/23 1558       Nutrition Therapy   RD appointment deferred Yes             Nutrition Discharge: Rate Your Plate Scores:   Nutrition Goals Re-Evaluation:   Nutrition Goals Discharge (Final Nutrition Goals Re-Evaluation):   Psychosocial: Target Goals: Acknowledge presence or absence of significant depression and/or stress, maximize coping skills, provide positive support system. Participant is able to verbalize types and ability to use techniques and skills needed for reducing stress and depression.   Initial Review & Psychosocial Screening:  Initial Psych Review & Screening - 12/01/23 0938       Initial Review   Current issues with None Identified      Family Dynamics   Good Support System? Yes    Comments Samaj can look to his wife who is a Engineer, civil (consulting) for his main support. He does not take anything for his mood.      Barriers   Psychosocial barriers to participate in program The  patient should benefit from training in stress management and relaxation.;There are no identifiable barriers or psychosocial needs.      Screening Interventions   Interventions Encouraged to exercise;Program counselor consult;To provide support and resources with identified psychosocial needs;Provide feedback about the scores to participant    Expected Outcomes Short Term goal: Utilizing psychosocial counselor, staff and physician to assist with identification of specific Stressors or current issues interfering with healing process. Setting desired goal for each stressor or current issue identified.;Long Term Goal: Stressors or current issues are controlled or eliminated.;Short Term goal: Identification and review with participant of any Quality of Life or Depression concerns found by scoring the questionnaire.;Long Term goal: The participant improves quality of Life and PHQ9 Scores as seen by post scores and/or verbalization of changes             Quality of Life Scores:    Scores should be at or above lower SD. Change of 5 points or better indicates improvement in first 5 Factors. Maximum score for last three Factors is 6.  Score Interpretation Lower SD  Social Relationships and Interactions 23.99  Self-Concept and Feelings 17.79  Symptoms and Limitations 11.75  Fear and Uncertainty 8.96  Positive Adaptation 22.29  Job 2.08  Sexual Function 1.43  Intimate Mean 2.08   PHQ-9: Review Flowsheet  More data exists      12/01/2023 06/25/2022 03/04/2022 05/14/2018 01/06/2017  Depression screen PHQ 2/9  Decreased Interest 0 0 0 0 0  Down, Depressed, Hopeless 0 0 0 0 0  PHQ - 2 Score 0 0 0 0 0  Altered sleeping 0 1 1 0 -  Tired, decreased energy 2 1 1  0 -  Change in appetite 0 1 0 0 -  Feeling bad or failure about yourself  0 0 0 0 -  Trouble concentrating 0 1 1 0 -  Moving slowly or fidgety/restless 0 0 0 0 -  Suicidal thoughts 0 0 0 0 -  PHQ-9 Score 2 4 3  0 -  Difficult doing  work/chores Not difficult at all Not difficult at all Somewhat difficult Not difficult at all -   Interpretation of Total Score  Total Score Depression Severity:  1-4 = Minimal depression, 5-9 = Mild depression, 10-14 = Moderate depression, 15-19 = Moderately severe depression, 20-27 = Severe depression   Psychosocial Evaluation and Intervention:  Psychosocial Evaluation - 12/01/23 0939       Psychosocial Evaluation & Interventions   Interventions Therapist referral;Physician referral;Stress management education;Encouraged to exercise with the program and follow exercise prescription    Comments Magnus can look to his wife who is a Engineer, civil (consulting) for his main support. He does not take anything for his mood.    Expected Outcomes Short: Attend [facility] stress management education to decrease stress. Long: Maintain exercise Post [facility] to keep stress at a minimum.    Continue Psychosocial Services  Follow up required by staff             Psychosocial Re-Evaluation:   Psychosocial Discharge (Final Psychosocial Re-Evaluation):   Vocational Rehabilitation: Provide vocational rehab assistance to qualifying candidates.   Vocational Rehab Evaluation & Intervention:   Education: Education Goals: Education classes will be provided on a variety of topics geared toward better understanding of heart and vascular health and risk factor modification. Participant will state understanding/return demonstration of topics presented as noted by education test scores.  Learning Barriers/Preferences:  Learning Barriers/Preferences - 12/01/23 0936       Learning Barriers/Preferences   Learning Barriers Sight    Learning Preferences None             Education Topics: General Nutrition Guidelines/Fats and Fiber: -Group instruction provided by verbal, written material, models and posters to present the general guidelines for heart healthy nutrition. Gives an explanation and review of dietary  fats and fiber. Flowsheet Row Cardiac Rehab from 06/12/2022 in [facility] Cardiac and Pulmonary Rehab  Education need identified 03/04/22       Controlling Sodium/Reading Food Labels: -Group verbal and written material supporting the discussion of sodium use in heart healthy nutrition. Review and explanation with models, verbal and written materials for utilization of the food label.   Exercise Physiology & Risk Factors: - Group verbal and written instruction with models to review the exercise physiology of the cardiovascular system and associated critical values. Details cardiovascular disease risk factors and the goals associated with each risk factor. Flowsheet Row Cardiac Rehab from 06/12/2022 in [facility] Cardiac and Pulmonary Rehab  Education need identified 03/04/22       Aerobic Exercise & Resistance Training: - Gives group verbal and written discussion on the health impact of inactivity. On the components of aerobic and resistive training programs and the benefits of this training and how  to safely progress through these programs.   Flexibility, Balance, General Exercise Guidelines: - Provides group verbal and written instruction on the benefits of flexibility and balance training programs. Provides general exercise guidelines with specific guidelines to those with heart or lung disease. Demonstration and skill practice provided. Flowsheet Row Cardiac Rehab from 06/12/2022 in [facility] Cardiac and Pulmonary Rehab  Date 06/12/22  Educator SB  Instruction Review Code 1- Verbalizes Understanding       Stress Management: - Provides group verbal and written instruction about the health risks of elevated stress, cause of high stress, and healthy ways to reduce stress.   Depression: - Provides group verbal and written instruction on the correlation between heart/lung disease and depressed mood, treatment options, and the stigmas associated with seeking treatment.   Anatomy & Physiology of the  Heart: - Group verbal and written instruction and models provide basic cardiac anatomy and physiology, with the coronary electrical and arterial systems. Review of: AMI, Angina, Valve disease, Heart Failure, Cardiac Arrhythmia, Pacemakers, and the ICD. Flowsheet Row Cardiac Rehab from 06/12/2022 in [facility] Cardiac and Pulmonary Rehab  Education need identified 03/04/22       Cardiac Procedures: - Group verbal and written instruction to review commonly prescribed medications for heart disease. Reviews the medication, class of the drug, and side effects. Includes the steps to properly store meds and maintain the prescription regimen. (beta blockers and nitrates)   Cardiac Medications I: - Group verbal and written instruction to review commonly prescribed medications for heart disease. Reviews the medication, class of the drug, and side effects. Includes the steps to properly store meds and maintain the prescription regimen.   Cardiac Medications II: -Group verbal and written instruction to review commonly prescribed medications for heart disease. Reviews the medication, class of the drug, and side effects. (all other drug classes)    Go Sex-Intimacy & Heart Disease, Get SMART - Goal Setting: - Group verbal and written instruction through game format to discuss heart disease and the return to sexual intimacy. Provides group verbal and written material to discuss and apply goal setting through the application of the S.M.A.R.T. Method.   Other Matters of the Heart: - Provides group verbal, written materials and models to describe Heart Failure, Angina, Valve Disease, Peripheral Artery Disease, and Diabetes in the realm of heart disease. Includes description of the disease process and treatment options available to the cardiac patient.   Exercise & Equipment Safety: - Individual verbal instruction and demonstration of equipment use and safety with use of the equipment. Flowsheet Row Cardiac  Rehab from 12/01/2023 in [facility] Cardiac and Pulmonary Rehab  Date 12/01/23  Educator [facility]  Instruction Review Code 1- Verbalizes Understanding       Infection Prevention: - Provides verbal and written material to individual with discussion of infection control including proper hand washing and proper equipment cleaning during exercise session. Flowsheet Row Cardiac Rehab from 12/01/2023 in [facility] Cardiac and Pulmonary Rehab  Date 12/01/23  Educator [facility]  Instruction Review Code 1- Verbalizes Understanding       Falls Prevention: - Provides verbal and written material to individual with discussion of falls prevention and safety. Flowsheet Row Cardiac Rehab from 12/01/2023 in [facility] Cardiac and Pulmonary Rehab  Date 12/01/23  Educator [facility]  Instruction Review Code 1- Verbalizes Understanding       Diabetes: - Individual verbal and written instruction to review signs/symptoms of diabetes, desired ranges of glucose level fasting, after meals and with exercise. Acknowledge that pre and post  exercise glucose checks will be done for 3 sessions at entry of program. Flowsheet Row Cardiac Rehab from 12/01/2023 in [facility] Cardiac and Pulmonary Rehab  Date 12/01/23  Educator [facility]  Instruction Review Code 1- Verbalizes Understanding       Other: -Provides group and verbal instruction on various topics (see comments)    Knowledge Questionnaire Score:   Core Components/Risk Factors/Patient Goals at Admission:  Personal Goals and Risk Factors at Admission - 12/01/23 1558       Core Components/Risk Factors/Patient Goals on Admission    Weight Management Yes    Intervention Weight Management: Develop a combined nutrition and exercise program designed to reach desired caloric intake, while maintaining appropriate intake of nutrient and fiber, sodium and fats, and appropriate energy expenditure required for the weight goal.;Weight Management: Provide education and appropriate resources to help participant  work on and attain dietary goals.;Weight Management/Obesity: Establish reasonable short term and long term weight goals.    Admit Weight 173 lb 8 oz (78.7 kg)    Goal Weight: Short Term 173 lb 8 oz (78.7 kg)    Goal Weight: Long Term 173 lb 8 oz (78.7 kg)    Expected Outcomes Short Term: Continue to assess and modify interventions until short term weight is achieved;Weight Maintenance: Understanding of the daily nutrition guidelines, which includes 25-35% calories from fat, 7% or less cal from saturated fats, less than 200mg  cholesterol, less than 1.5gm of sodium, & 5 or more servings of fruits and vegetables daily;Understanding recommendations for meals to include 15-35% energy as protein, 25-35% energy from fat, 35-60% energy from carbohydrates, less than 200mg  of dietary cholesterol, 20-35 gm of total fiber daily;Understanding of distribution of calorie intake throughout the day with the consumption of 4-5 meals/snacks    Tobacco Cessation Yes    Intervention Assist the participant in steps to quit. Provide individualized education and counseling about committing to Tobacco Cessation, relapse prevention, and pharmacological support that can be provided by physician.;Education officer, environmental, assist with locating and accessing local/national Quit Smoking programs, and support quit date choice.    Expected Outcomes Short Term: Will demonstrate readiness to quit, by selecting a quit date.;Short Term: Will quit all tobacco product use, adhering to prevention of relapse plan.;Long Term: Complete abstinence from all tobacco products for at least 12 months from quit date.    Diabetes Yes    Intervention Provide education about signs/symptoms and action to take for hypo/hyperglycemia.;Provide education about proper nutrition, including hydration, and aerobic/resistive exercise prescription along with prescribed medications to achieve blood glucose in normal ranges: Fasting glucose 65-99 mg/dL    Expected  Outcomes Short Term: Participant verbalizes understanding of the signs/symptoms and immediate care of hyper/hypoglycemia, proper foot care and importance of medication, aerobic/resistive exercise and nutrition plan for blood glucose control.;Long Term: Attainment of HbA1C < 7%.    Hypertension Yes    Intervention Provide education on lifestyle modifcations including regular physical activity/exercise, weight management, moderate sodium restriction and increased consumption of fresh fruit, vegetables, and low fat dairy, alcohol moderation, and smoking cessation.;Monitor prescription use compliance.    Expected Outcomes Short Term: Continued assessment and intervention until BP is < 140/39mm HG in hypertensive participants. < 130/53mm HG in hypertensive participants with diabetes, heart failure or chronic kidney disease.;Long Term: Maintenance of blood pressure at goal levels.    Lipids Yes    Intervention Provide education and support for participant on nutrition & aerobic/resistive exercise along with prescribed medications to achieve LDL 70mg , HDL >40mg .  Expected Outcomes Short Term: Participant states understanding of desired cholesterol values and is compliant with medications prescribed. Participant is following exercise prescription and nutrition guidelines.;Long Term: Cholesterol controlled with medications as prescribed, with individualized exercise RX and with personalized nutrition plan. Value goals: LDL < 70mg , HDL > 40 mg.             Core Components/Risk Factors/Patient Goals Review:    Core Components/Risk Factors/Patient Goals at Discharge (Final Review):    ITP Comments:  ITP Comments     Row Name 12/01/23 0942 12/01/23 1541         ITP Comments Virtual Visit completed. Patient informed on EP and RD appointment and 6 Minute walk test. Patient also informed of patient health questionnaires on My Chart. Patient Verbalizes understanding. Visit diagnosis can be found in [facility]  11/14/2023. Completed and gym orientation. Initial ITP created and sent for review to Dr. Bethann Punches, Medical Director. Ron is a current tobacco user. Intervention for tobacco cessation was provided at the initial medical review.He was asked about readiness to quit and reported he doesn't smoke that much, just occationally. Patient was advised and educated about tobacco cessation using combination therapy, tobacco cessation classes, quit line, and quit smoking apps. Patient demonstrated understanding of this material. Staff will continue to provide encouragement and follow up with the patient throughout the program.               Comments: initial ITP

## 2023-12-04 ENCOUNTER — Encounter: Admitting: *Deleted

## 2023-12-04 DIAGNOSIS — I739 Peripheral vascular disease, unspecified: Secondary | ICD-10-CM

## 2023-12-04 DIAGNOSIS — I70213 Atherosclerosis of native arteries of extremities with intermittent claudication, bilateral legs: Secondary | ICD-10-CM | POA: Diagnosis not present

## 2023-12-04 LAB — GLUCOSE, CAPILLARY
Glucose-Capillary: 107 mg/dL — ABNORMAL HIGH (ref 70–99)
Glucose-Capillary: 112 mg/dL — ABNORMAL HIGH (ref 70–99)

## 2023-12-04 NOTE — Progress Notes (Signed)
 Daily Session Note  Patient Details  Name: Daniel Fuentes MRN: [id number] Date of Birth: Jul 19, 1951 Referring Provider:   Flowsheet Row Cardiac Rehab from 12/01/2023 in [facility] Cardiac and Pulmonary Rehab  Referring Provider Dr. Lorine Bears       Encounter Date: 12/04/2023  Check In:  Session Check In - 12/04/23 1749       Check-In   Supervising physician immediately available to respond to emergencies PAD/SET Supervising Physician    Physician(s) Dr. Fanny Bien and Derrill Kay    Location [facility]-Cardiac & Pulmonary Rehab    Staff Present Susann Givens RN,BSN;Joseph Hollace Kinnier;Betsy Coder PhD, RN,CNS,CEN;Margaret Best, MS, Exercise Physiologist    Virtual Visit No    Medication changes reported     No    Fall or balance concerns reported    No    Tobacco Cessation No Change    Current number of cigarettes/nicotine per day     7    Warm-up and Cool-down Performed on first and last piece of equipment    Resistance Training Performed Yes    VAD Patient? No    PAD/SET Patient? Yes      PAD/SET Patient   Completed foot check today? Yes    Open wounds to report? No      Pain Assessment   Currently in Pain? No/denies                Social History   Tobacco Use  Smoking Status Every Day   Current packs/day: 0.00   Average packs/day: 0.2 packs/day for 55.4 years (11.1 ttl pk-yrs)   Types: Pipe, Cigarettes   Start date: 09/04/1966   Last attempt to quit: 02/08/2022   Years since quitting: 1.8  Smokeless Tobacco Never  Tobacco Comments   Smokes 7 cigarettes in a week.     Goals Met:  Independence with exercise equipment Exercise tolerated well No report of concerns or symptoms today Strength training completed today  Goals Unmet:  Not Applicable  Comments: First full day of exercise!  Patient was oriented to gym and equipment including functions, settings, policies, and procedures.  Patient's individual exercise prescription and treatment plan were reviewed.  All  starting workloads were established based on the results of the 6 minute walk test done at initial orientation visit.  The plan for exercise progression was also introduced and progression will be customized based on patient's performance and goals.    Dr. Bethann Punches is Medical Director for [facility] Cardiac Rehabilitation.  Dr. Vida Rigger is Medical Director for [facility] Pulmonary Rehabilitation.

## 2023-12-08 ENCOUNTER — Encounter: Admitting: *Deleted

## 2023-12-08 DIAGNOSIS — I70213 Atherosclerosis of native arteries of extremities with intermittent claudication, bilateral legs: Secondary | ICD-10-CM | POA: Diagnosis not present

## 2023-12-08 DIAGNOSIS — I739 Peripheral vascular disease, unspecified: Secondary | ICD-10-CM

## 2023-12-08 LAB — GLUCOSE, CAPILLARY
Glucose-Capillary: 90 mg/dL (ref 70–99)
Glucose-Capillary: 86 mg/dL (ref 70–99)

## 2023-12-08 NOTE — Progress Notes (Signed)
 Daily Session Note  Patient Details  Name: Daniel Fuentes MRN: [id number] Date of Birth: [birth year] Referring Provider:   Flowsheet Row Cardiac Rehab from 12/01/2023 in [facility] Cardiac and Pulmonary Rehab  Referring Provider Dr. Lorine Bears       Encounter Date: 12/08/2023  Check In:  Session Check In - 12/08/23 1710       Check-In   Supervising physician immediately available to respond to emergencies See telemetry face sheet for immediately available ER MD    Physician(s) Dr. Larinda Buttery and [facility]    Location [facility]-Cardiac & Pulmonary Rehab    Staff Present Susann Givens RN,BSN;Joseph [facility] BS, ACSM CEP, Exercise Physiologist    Virtual Visit No    Medication changes reported     No    Fall or balance concerns reported    No    Tobacco Cessation No Change    Current number of cigarettes/nicotine per day     7    Warm-up and Cool-down Performed on first and last piece of equipment    Resistance Training Performed Yes    VAD Patient? No    PAD/SET Patient? Yes      PAD/SET Patient   Completed foot check today? Yes    Open wounds to report? No      Pain Assessment   Currently in Pain? No/denies                Social History   Tobacco Use  Smoking Status Every Day   Current packs/day: 0.00   Average packs/day: 0.2 packs/day for 55.4 years (11.1 ttl pk-yrs)   Types: Pipe, Cigarettes   Start date: 09/04/1966   Last attempt to quit: 02/08/2022   Years since quitting: 1.8  Smokeless Tobacco Never  Tobacco Comments   Smokes 7 cigarettes in a week.     Goals Met:  Independence with exercise equipment Exercise tolerated well No report of concerns or symptoms today Strength training completed today  Goals Unmet:  Not Applicable  Comments: Pt able to follow exercise prescription today without complaint.  Will continue to monitor for progression.    Dr. Bethann Punches is Medical Director for [facility] Cardiac Rehabilitation.  Dr. Vida Rigger is Medical Director for [facility] Pulmonary Rehabilitation.

## 2023-12-10 ENCOUNTER — Encounter: Admitting: *Deleted

## 2023-12-10 DIAGNOSIS — I739 Peripheral vascular disease, unspecified: Secondary | ICD-10-CM

## 2023-12-10 DIAGNOSIS — I70213 Atherosclerosis of native arteries of extremities with intermittent claudication, bilateral legs: Secondary | ICD-10-CM | POA: Diagnosis not present

## 2023-12-10 LAB — GLUCOSE, CAPILLARY
Glucose-Capillary: 87 mg/dL (ref 70–99)
Glucose-Capillary: 112 mg/dL — ABNORMAL HIGH (ref 70–99)

## 2023-12-10 NOTE — Progress Notes (Signed)
 PAD/SET Plan of Care Updates  Patient Details  Name: Daniel Fuentes MRN: [id number] Date of Birth: 11/13/1950  Entry Date:  Flowsheet Row Cardiac Rehab from 12/01/2023 in [facility] Cardiac and Pulmonary Rehab  Date 12/01/23      Expected Discharge Date:   Medical Diagnosis: PAD (peripheral artery disease) (HCC)  Encounter Date: 12/10/23  Exercise Target Goals: Exercise Program Goal: Individual exercise prescription set with THRR, safety & activity barriers. Participant demonstrates ability to understand and report RPE using RPE 6-20 scale, to self-measure pulse accurately, and to acknowledge the importance of the exercise prescription.  Exercise Prescription Goal: Use initial exercise assessment to set exercise prescription to improve time and distance to onset of claudication pain. Provide education to aid in steps toward risk factor modification, improve quality of life with claudication, and utilize THRR and exercise prescription for best results for decreasing symptoms of PAD. Prevent injury to bone, joints, and skin integrity during the exercise through checks of each system during session check in. Following physician guidelines for any contraindications to specific exercises.  Activity Barriers:  Activity Barriers & Cardiac Risk Stratification - 12/01/23 1550       Activity Barriers & Cardiac Risk Stratification   Activity Barriers Other (comment)    Comments AAA BP restrictions >140/90 unless MD provides other restrictions             Initial Exercise Prescription:  Initial Exercise Prescription - 12/01/23 1500       Date of Initial Exercise RX and Referring Provider   Date 12/01/23    Referring Provider Dr. Lorine Bears      Oxygen   Maintain Oxygen Saturation 88% or higher      Treadmill   MPH 2    Grade 2    Minutes 15    METs 3.08      NuStep   Level 3    SPM 80    Minutes 15    METs 3      Biostep-RELP   Level 3    SPM 50    Minutes 15    METs 3       Prescription Details   Frequency (times per week) 3    Duration Progress to 30 minutes of continuous aerobic without signs/symptoms of physical distress      Intensity   THRR 40-80% of Max Heartrate 108-133    Ratings of Perceived Exertion 11-13    Perceived Dyspnea 0-4      Progression   Progression Continue to follow PAD protocol      Resistance Training   Training Prescription Yes    Weight 4    Reps 10-15             Perform Capillary Blood Glucose checks as needed.  Current Exercise Prescription:   Exercise Prescription Changes     Row Name 12/01/23 1500             Response to Exercise   Blood Pressure (Admit) 122/80       Blood Pressure (Exercise) 168/86       Blood Pressure (Exit) 128/82       Heart Rate (Admit) 83 bpm       Heart Rate (Exercise) 108 bpm       Heart Rate (Exit) 74 bpm       Oxygen Saturation (Admit) 98 %       Oxygen Saturation (Exercise) 97 %       Oxygen Saturation (Exit) 97 %  Rating of Perceived Exertion (Exercise) 15       Symptoms PAD pain 4/5       Comments PAD walk test                Exercise Comments:   Exercise Comments     Row Name 12/04/23 1751           Exercise Comments First full day of exercise!  Patient was oriented to gym and equipment including functions, settings, policies, and procedures.  Patient's individual exercise prescription and treatment plan were reviewed.  All starting workloads were established based on the results of the 6 minute walk test done at initial orientation visit.  The plan for exercise progression was also introduced and progression will be customized based on patient's performance and goals.                Program and Patient Goals Exercise Goals and Review:   Exercise Goals     Row Name 12/01/23 1556             Exercise Goals   Increase Physical Activity Yes       Intervention Provide advice, education, support and counseling about physical  activity/exercise needs.;Develop an individualized exercise prescription for aerobic and resistive training based on initial evaluation findings, risk stratification, comorbidities and participant's personal goals.       Expected Outcomes Short Term: Attend rehab on a regular basis to increase amount of physical activity.;Long Term: Add in home exercise to make exercise part of routine and to increase amount of physical activity.;Long Term: Exercising regularly at least 3-5 days a week.       Increase Strength and Stamina Yes       Intervention Provide advice, education, support and counseling about physical activity/exercise needs.;Develop an individualized exercise prescription for aerobic and resistive training based on initial evaluation findings, risk stratification, comorbidities and participant's personal goals.       Expected Outcomes Short Term: Increase workloads from initial exercise prescription for resistance, speed, and METs.;Short Term: Perform resistance training exercises routinely during rehab and add in resistance training at home;Long Term: Improve cardiorespiratory fitness, muscular endurance and strength as measured by increased METs and functional capacity ( )       Able to understand and use rate of perceived exertion (RPE) scale Yes       Intervention Provide education and explanation on how to use RPE scale       Expected Outcomes Short Term: Able to use RPE daily in rehab to express subjective intensity level;Long Term:  Able to use RPE to guide intensity level when exercising independently       Able to understand and use Dyspnea scale Yes       Intervention Provide education and explanation on how to use Dyspnea scale       Expected Outcomes Short Term: Able to use Dyspnea scale daily in rehab to express subjective sense of shortness of breath during exertion;Long Term: Able to use Dyspnea scale to guide intensity level when exercising independently       Knowledge and  understanding of Target Heart Rate Range (THRR) Yes       Intervention Provide education and explanation of THRR including how the numbers were predicted and where they are located for reference       Expected Outcomes Short Term: Able to state/look up THRR;Long Term: Able to use THRR to govern intensity when exercising independently;Short Term: Able to use daily as guideline  for intensity in rehab       Able to check pulse independently Yes       Intervention Review the importance of being able to check your own pulse for safety during independent exercise;Provide education and demonstration on how to check pulse in carotid and radial arteries.       Expected Outcomes Short Term: Able to explain why pulse checking is important during independent exercise;Long Term: Able to check pulse independently and accurately       Understanding of Exercise Prescription Yes       Intervention Provide education, explanation, and written materials on patient's individual exercise prescription       Expected Outcomes Short Term: Able to explain program exercise prescription;Long Term: Able to explain home exercise prescription to exercise independently       Improve claudication pain toleration; Improve walking ability Yes       Intervention Participate in PAD/SET Rehab 2-3 days a week, walking at home as part of exercise prescription;Attend education sessions to aid in risk factor modification and understanding of disease process       Expected Outcomes Short Term: Improve walking distance/time to onset of claudication pain;Long Term: Improve score of PAD questionnaires                Nutrition:  Target Goals: Understanding of nutrition guidelines, daily intake of sodium 1500mg , cholesterol 200mg , calories 30% from fat and 7% or less from saturated fats, daily to have 5 or more servings of fruits and vegetables.  Biometrics:  Pre Biometrics - 12/01/23 1557       Pre Biometrics   Height 5' 7.75" (1.721  m)    Weight 173 lb 8 oz (78.7 kg)    Waist Circumference 37 inches    Hip Circumference 38 inches    Waist to Hip Ratio 0.97 %    BMI (Calculated) 26.57    Single Leg Stand 30 seconds              Nutrition Therapy Plan and Nutrition Goals:  Nutrition Therapy & Goals - 12/01/23 1558       Nutrition Therapy   RD appointment deferred Yes             Core Components/Risk Factors/Patient Goals at Admission:  Personal Goals and Risk Factors at Admission - 12/01/23 1558       Core Components/Risk Factors/Patient Goals on Admission    Weight Management Yes    Intervention Weight Management: Develop a combined nutrition and exercise program designed to reach desired caloric intake, while maintaining appropriate intake of nutrient and fiber, sodium and fats, and appropriate energy expenditure required for the weight goal.;Weight Management: Provide education and appropriate resources to help participant work on and attain dietary goals.;Weight Management/Obesity: Establish reasonable short term and long term weight goals.    Admit Weight 173 lb 8 oz (78.7 kg)    Goal Weight: Short Term 173 lb 8 oz (78.7 kg)    Goal Weight: Long Term 173 lb 8 oz (78.7 kg)    Expected Outcomes Short Term: Continue to assess and modify interventions until short term weight is achieved;Weight Maintenance: Understanding of the daily nutrition guidelines, which includes 25-35% calories from fat, 7% or less cal from saturated fats, less than 200mg  cholesterol, less than 1.5gm of sodium, & 5 or more servings of fruits and vegetables daily;Understanding recommendations for meals to include 15-35% energy as protein, 25-35% energy from fat, 35-60% energy from carbohydrates, less than 200mg   of dietary cholesterol, 20-35 gm of total fiber daily;Understanding of distribution of calorie intake throughout the day with the consumption of 4-5 meals/snacks    Tobacco Cessation Yes    Intervention Assist the participant  in steps to quit. Provide individualized education and counseling about committing to Tobacco Cessation, relapse prevention, and pharmacological support that can be provided by physician.;Education officer, environmental, assist with locating and accessing local/national Quit Smoking programs, and support quit date choice.    Expected Outcomes Short Term: Will demonstrate readiness to quit, by selecting a quit date.;Short Term: Will quit all tobacco product use, adhering to prevention of relapse plan.;Long Term: Complete abstinence from all tobacco products for at least 12 months from quit date.    Diabetes Yes    Intervention Provide education about signs/symptoms and action to take for hypo/hyperglycemia.;Provide education about proper nutrition, including hydration, and aerobic/resistive exercise prescription along with prescribed medications to achieve blood glucose in normal ranges: Fasting glucose 65-99 mg/dL    Expected Outcomes Short Term: Participant verbalizes understanding of the signs/symptoms and immediate care of hyper/hypoglycemia, proper foot care and importance of medication, aerobic/resistive exercise and nutrition plan for blood glucose control.;Long Term: Attainment of HbA1C < 7%.    Hypertension Yes    Intervention Provide education on lifestyle modifcations including regular physical activity/exercise, weight management, moderate sodium restriction and increased consumption of fresh fruit, vegetables, and low fat dairy, alcohol moderation, and smoking cessation.;Monitor prescription use compliance.    Expected Outcomes Short Term: Continued assessment and intervention until BP is < 140/2mm HG in hypertensive participants. < 130/39mm HG in hypertensive participants with diabetes, heart failure or chronic kidney disease.;Long Term: Maintenance of blood pressure at goal levels.    Lipids Yes    Intervention Provide education and support for participant on nutrition & aerobic/resistive  exercise along with prescribed medications to achieve LDL 70mg , HDL >40mg .    Expected Outcomes Short Term: Participant states understanding of desired cholesterol values and is compliant with medications prescribed. Participant is following exercise prescription and nutrition guidelines.;Long Term: Cholesterol controlled with medications as prescribed, with individualized exercise RX and with personalized nutrition plan. Value goals: LDL < 70mg , HDL > 40 mg.             ITP Comments:  ITP Comments     Row Name 12/01/23 1610 12/01/23 1541 12/04/23 1750 12/10/23 0850     ITP Comments Virtual Visit completed. Patient informed on EP and RD appointment and 6 Minute walk test. Patient also informed of patient health questionnaires on My Chart. Patient Verbalizes understanding. Visit diagnosis can be found in [facility] 11/14/[facility]23. Completed and gym orientation. Initial ITP created and sent for review to Dr. Bethann Punches, Medical Director. Daniel Fuentes is a current tobacco user. Intervention for tobacco cessation was provided at the initial medical review.He was asked about readiness to quit and reported he doesn't smoke that much, just occationally. Patient was advised and educated about tobacco cessation using combination therapy, tobacco cessation classes, quit line, and quit smoking apps. Patient demonstrated understanding of this material. Staff will continue to provide encouragement and follow up with the patient throughout the program. First full day of exercise!  Patient was oriented to gym and equipment including functions, settings, policies, and procedures.  Patient's individual exercise prescription and treatment plan were reviewed.  All starting workloads were established based on the results of the 6 minute walk test done at initial orientation visit.  The plan for exercise progression was also introduced and progression  will be customized based on patient's performance and goals. 30 Day review completed.  Medical Director ITP review done, changes made as directed, and signed approval by Medical Director. New to program.             Comments: 30 Day review completed. Medical Director ITP review done, changes made as directed, and signed approval by Medical Director. New to program.  Physician Documentation: Your signature/cosign is required to indicate approval of the Care Plan as stated above. By signing this report, you are approving the Plan of Care. Please sign and either send electronically or print and fax the signed copy to the number below. Please indicate any changes or limitations in the space provided.   ____________________________________________________________________________________________________________________________________________  Physician Signature: _____________________________________________________ Print Physician Name: ____________________________________________________ Date: ___________________________ Time: _________________________________  [facility] PAD/SET Program [address] [place], [place] [address] Phone: [phone] Fax: [phone]

## 2023-12-10 NOTE — Progress Notes (Signed)
 Daily Session Note  Patient Details  Name: Daniel Fuentes MRN: [id number] Date of Birth: June 08, 1951 Referring Provider:   Flowsheet Row Cardiac Rehab from 12/01/2023 in [facility] Cardiac and Pulmonary Rehab  Referring Provider Dr. Lorine Bears       Encounter Date: 12/10/2023  Check In:  Session Check In - 12/10/23 1722       Check-In   Supervising physician immediately available to respond to emergencies PAD/SET Supervising Physician    Physician(s) Dr. Larinda Buttery and Derrill Kay    Location [facility]-Cardiac & Pulmonary Rehab    Staff Present Susann Givens RN,BSN;Joseph [facility] BS, ACSM CEP, Exercise Physiologist    Virtual Visit No    Medication changes reported     No    Fall or balance concerns reported    No    Tobacco Cessation No Change    Current number of cigarettes/nicotine per day     7    Resistance Training Performed Yes    VAD Patient? No    PAD/SET Patient? Yes      PAD/SET Patient   Completed foot check today? Yes    Open wounds to report? No      Pain Assessment   Currently in Pain? No/denies                Social History   Tobacco Use  Smoking Status Every Day   Current packs/day: 0.00   Average packs/day: 0.2 packs/day for 55.4 years (11.1 ttl pk-yrs)   Types: Pipe, Cigarettes   Start date: 09/04/1966   Last attempt to quit: 02/08/2022   Years since quitting: 1.8  Smokeless Tobacco Never  Tobacco Comments   Smokes 7 cigarettes in a week.     Goals Met:  Independence with exercise equipment Exercise tolerated well No report of concerns or symptoms today Strength training completed today  Goals Unmet:  Not Applicable  Comments: Pt able to follow exercise prescription today without complaint.  Will continue to monitor for progression.    Dr. Bethann Punches is Medical Director for [facility] Cardiac Rehabilitation.  Dr. Vida Rigger is Medical Director for [facility] Pulmonary Rehabilitation.

## 2023-12-11 ENCOUNTER — Encounter: Admitting: *Deleted

## 2023-12-11 DIAGNOSIS — I70213 Atherosclerosis of native arteries of extremities with intermittent claudication, bilateral legs: Secondary | ICD-10-CM | POA: Diagnosis not present

## 2023-12-11 DIAGNOSIS — I739 Peripheral vascular disease, unspecified: Secondary | ICD-10-CM

## 2023-12-11 NOTE — Progress Notes (Signed)
 Daily Session Note  Patient Details  Name: Keylan Costabile MRN: [id number] Date of Birth: 27-May-1951 Referring Provider:   Flowsheet Row Cardiac Rehab from 12/01/2023 in [facility] Cardiac and Pulmonary Rehab  Referring Provider Dr. Lorine Bears       Encounter Date: 12/11/2023  Check In:  Session Check In - 12/11/23 1709       Check-In   Supervising physician immediately available to respond to emergencies See telemetry face sheet for immediately available ER MD    Location [facility]-Cardiac & Pulmonary Rehab    Staff Present Susann Givens RN,BSN;Joseph [facility] BS, Exercise Physiologist    Virtual Visit No    Medication changes reported     No    Fall or balance concerns reported    No    Tobacco Cessation No Change    Current number of cigarettes/nicotine per day     7    Warm-up and Cool-down Performed on first and last piece of equipment    Resistance Training Performed Yes    VAD Patient? No    PAD/SET Patient? Yes      PAD/SET Patient   Completed foot check today? Yes    Open wounds to report? No      Pain Assessment   Currently in Pain? No/denies                Social History   Tobacco Use  Smoking Status Every Day   Current packs/day: 0.00   Average packs/day: 0.2 packs/day for 55.4 years (11.1 ttl pk-yrs)   Types: Pipe, Cigarettes   Start date: 09/04/1966   Last attempt to quit: 02/08/2022   Years since quitting: 1.8  Smokeless Tobacco Never  Tobacco Comments   Smokes 7 cigarettes in a week.     Goals Met:  Independence with exercise equipment Exercise tolerated well No report of concerns or symptoms today Strength training completed today  Goals Unmet:  Not Applicable  Comments: Pt able to follow exercise prescription today without complaint.  Will continue to monitor for progression.    Dr. Bethann Punches is Medical Director for [facility] Cardiac Rehabilitation.  Dr. Vida Rigger is Medical Director for [facility]  Pulmonary Rehabilitation.

## 2023-12-12 ENCOUNTER — Other Ambulatory Visit: Payer: Medicare Other

## 2023-12-15 ENCOUNTER — Encounter: Admitting: *Deleted

## 2023-12-15 DIAGNOSIS — I739 Peripheral vascular disease, unspecified: Secondary | ICD-10-CM

## 2023-12-15 DIAGNOSIS — I70213 Atherosclerosis of native arteries of extremities with intermittent claudication, bilateral legs: Secondary | ICD-10-CM | POA: Diagnosis not present

## 2023-12-15 NOTE — Progress Notes (Signed)
 Daily Session Note  Patient Details  Name: Daniel Fuentes MRN: [id number] Date of Birth: 05-22-1951 Referring Provider:   Flowsheet Row Cardiac Rehab from 12/01/2023 in [facility] Cardiac and Pulmonary Rehab  Referring Provider Dr. Lorine Bears       Encounter Date: 12/15/2023  Check In:  Session Check In - 12/15/23 1722       Check-In   Supervising physician immediately available to respond to emergencies See telemetry face sheet for immediately available ER MD    Location [facility]-Cardiac & Pulmonary Rehab    Staff Present Susann Givens RN,BSN;Joseph Casimer Lanius, [place], RRT, CPFT;Kelly [employer], ACSM CEP, Exercise Physiologist    Virtual Visit No    Medication changes reported     No    Fall or balance concerns reported    No    Tobacco Cessation No Change    Current number of cigarettes/nicotine per day     7    Warm-up and Cool-down Performed on first and last piece of equipment    Resistance Training Performed Yes    VAD Patient? No    PAD/SET Patient? Yes      PAD/SET Patient   Completed foot check today? Yes    Open wounds to report? No      Pain Assessment   Currently in Pain? No/denies                Social History   Tobacco Use  Smoking Status Every Day   Current packs/day: 0.00   Average packs/day: 0.2 packs/day for 55.4 years (11.1 ttl pk-yrs)   Types: Pipe, Cigarettes   Start date: 09/04/1966   Last attempt to quit: 02/08/2022   Years since quitting: 1.8  Smokeless Tobacco Never  Tobacco Comments   Smokes 7 cigarettes in a week.     Goals Met:  Independence with exercise equipment Exercise tolerated well No report of concerns or symptoms today Strength training completed today  Goals Unmet:  Not Applicable  Comments: Pt able to follow exercise prescription today without complaint.  Will continue to monitor for progression.    Dr. Bethann Punches is Medical Director for [facility] Cardiac Rehabilitation.  Dr. Vida Rigger is  Medical Director for [facility] Pulmonary Rehabilitation.

## 2023-12-17 ENCOUNTER — Encounter: Admitting: *Deleted

## 2023-12-17 DIAGNOSIS — I70213 Atherosclerosis of native arteries of extremities with intermittent claudication, bilateral legs: Secondary | ICD-10-CM | POA: Diagnosis not present

## 2023-12-17 DIAGNOSIS — I739 Peripheral vascular disease, unspecified: Secondary | ICD-10-CM

## 2023-12-17 NOTE — Progress Notes (Unsigned)
 Daily Session Note  Patient Details  Name: Daniel Fuentes MRN: [id number] Date of Birth: 05/07/1951 Referring Provider:   Flowsheet Row Cardiac Rehab from 12/01/2023 in [facility] Cardiac and Pulmonary Rehab  Referring Provider Dr. Lorine Bears       Encounter Date: 12/17/2023  Check In:  Session Check In - 12/17/23 1715       Check-In   Supervising physician immediately available to respond to emergencies See telemetry face sheet for immediately available ER MD    Physician(s) Dr. Roxan Hockey and Ray    Location [facility]-Cardiac & Pulmonary Rehab    Staff Present Tommye Standard BS, ACSM CEP, Exercise Physiologist;Jovon [place] Jewel Baize RN,BSN;Joseph Reino Kent RCP,RRT,BSRT    Virtual Visit No    Medication changes reported     No    Tobacco Cessation No Change    Warm-up and Cool-down Performed on first and last piece of equipment    Resistance Training Performed Yes    VAD Patient? No    PAD/SET Patient? Yes      PAD/SET Patient   Completed foot check today? Yes    Open wounds to report? No      Pain Assessment   Currently in Pain? No/denies                Social History   Tobacco Use  Smoking Status Every Day   Current packs/day: 0.00   Average packs/day: 0.2 packs/day for 55.4 years (11.1 ttl pk-yrs)   Types: Pipe, Cigarettes   Start date: 09/04/1966   Last attempt to quit: 02/08/2022   Years since quitting: 1.8  Smokeless Tobacco Never  Tobacco Comments   Smokes 7 cigarettes in a week.     Goals Met:  Independence with exercise equipment Exercise tolerated well No report of concerns or symptoms today Strength training completed today  Goals Unmet:  Not Applicable  Comments: Pt able to follow exercise prescription today without complaint.  Will continue to monitor for progression.    Dr. Bethann Punches is Medical Director for [facility] Cardiac Rehabilitation.  Dr. Vida Rigger is Medical Director for [facility] Pulmonary Rehabilitation.

## 2023-12-18 ENCOUNTER — Encounter: Admitting: *Deleted

## 2023-12-18 DIAGNOSIS — I70213 Atherosclerosis of native arteries of extremities with intermittent claudication, bilateral legs: Secondary | ICD-10-CM | POA: Diagnosis not present

## 2023-12-18 DIAGNOSIS — I739 Peripheral vascular disease, unspecified: Secondary | ICD-10-CM

## 2023-12-18 NOTE — Progress Notes (Signed)
 Daily Session Note  Patient Details  Name: Daniel Fuentes MRN: [id number] Date of Birth: Feb 05, 1951 Referring Provider:   Flowsheet Row Cardiac Rehab from 12/01/2023 in [facility] Cardiac and Pulmonary Rehab  Referring Provider Dr. Lorine Bears       Encounter Date: 12/18/2023  Check In:  Session Check In - 12/18/23 1723       Check-In   Supervising physician immediately available to respond to emergencies See telemetry face sheet for immediately available ER MD    Physician(s) Dr. Roxan Hockey and Ray    Location [facility]-Cardiac & Pulmonary Rehab    Staff Present Maxon Conetta BS, Exercise Physiologist;Vanellope Passmore Jewel Baize RN,BSN;Joseph Reino Kent RCP,RRT,BSRT    Virtual Visit No    Medication changes reported     No    Fall or balance concerns reported    No    Tobacco Cessation Use Decreased    Current number of cigarettes/nicotine per day     2    Warm-up and Cool-down Performed on first and last piece of equipment    Resistance Training Performed Yes    VAD Patient? No    PAD/SET Patient? Yes      PAD/SET Patient   Completed foot check today? Yes    Open wounds to report? No      Pain Assessment   Currently in Pain? No/denies                Social History   Tobacco Use  Smoking Status Every Day   Current packs/day: 0.00   Average packs/day: 0.2 packs/day for 55.4 years (11.1 ttl pk-yrs)   Types: Pipe, Cigarettes   Start date: 09/04/1966   Last attempt to quit: 02/08/2022   Years since quitting: 1.8  Smokeless Tobacco Never  Tobacco Comments   Smokes 7 cigarettes in a week.     Goals Met:  Independence with exercise equipment Exercise tolerated well No report of concerns or symptoms today Strength training completed today  Goals Unmet:  Not Applicable  Comments: Pt able to follow exercise prescription today without complaint.  Will continue to monitor for progression.    Dr. Bethann Punches is Medical Director for [facility] Cardiac Rehabilitation.  Dr. Vida Rigger  is Medical Director for [facility] Pulmonary Rehabilitation.

## 2023-12-22 ENCOUNTER — Encounter: Admitting: *Deleted

## 2023-12-22 DIAGNOSIS — I70213 Atherosclerosis of native arteries of extremities with intermittent claudication, bilateral legs: Secondary | ICD-10-CM | POA: Diagnosis not present

## 2023-12-22 DIAGNOSIS — I739 Peripheral vascular disease, unspecified: Secondary | ICD-10-CM

## 2023-12-22 NOTE — Progress Notes (Signed)
 Daily Session Note  Patient Details  Name: Sung Parodi MRN: [id number] Date of Birth: October 11, 1950 Referring Provider:   Flowsheet Row Cardiac Rehab from 12/01/2023 in [facility] Cardiac and Pulmonary Rehab  Referring Provider Dr. Lorine Bears       Encounter Date: 12/22/2023  Check In:  Session Check In - 12/22/23 1729       Check-In   Supervising physician immediately available to respond to emergencies PAD/SET Supervising Physician    Physician(s) Dr. Lenard Lance and Vicente Males    Location [facility]-Cardiac & Pulmonary Rehab    Staff Present Susann Givens RN,BSN;Joseph [facility] BS, ACSM CEP, Exercise Physiologist    Virtual Visit No    Medication changes reported     No    Fall or balance concerns reported    No    Tobacco Cessation No Change    Current number of cigarettes/nicotine per day     2    Warm-up and Cool-down Performed on first and last piece of equipment    Resistance Training Performed Yes    VAD Patient? No    PAD/SET Patient? Yes      PAD/SET Patient   Completed foot check today? Yes    Open wounds to report? No      Pain Assessment   Currently in Pain? No/denies                Social History   Tobacco Use  Smoking Status Every Day   Current packs/day: 0.00   Average packs/day: 0.2 packs/day for 55.4 years (11.1 ttl pk-yrs)   Types: Pipe, Cigarettes   Start date: 09/04/1966   Last attempt to quit: 02/08/2022   Years since quitting: 1.8  Smokeless Tobacco Never  Tobacco Comments   Smokes 7 cigarettes in a week.     Goals Met:  Independence with exercise equipment Exercise tolerated well No report of concerns or symptoms today Strength training completed today  Goals Unmet:  Not Applicable  Comments: Pt able to follow exercise prescription today without complaint.  Will continue to monitor for progression.    Dr. Bethann Punches is Medical Director for [facility] Cardiac Rehabilitation.  Dr. Vida Rigger is Medical  Director for [facility] Pulmonary Rehabilitation.

## 2023-12-24 ENCOUNTER — Encounter: Admitting: *Deleted

## 2023-12-24 DIAGNOSIS — I739 Peripheral vascular disease, unspecified: Secondary | ICD-10-CM

## 2023-12-24 DIAGNOSIS — I70213 Atherosclerosis of native arteries of extremities with intermittent claudication, bilateral legs: Secondary | ICD-10-CM | POA: Diagnosis not present

## 2023-12-24 NOTE — Progress Notes (Signed)
 Daily Session Note  Patient Details  Name: Armand Preast MRN: [id number] Date of Birth: 1950/12/19 Referring Provider:   Flowsheet Row Cardiac Rehab from 12/01/2023 in [facility] Cardiac and Pulmonary Rehab  Referring Provider Dr. Lorine Bears       Encounter Date: 12/24/2023  Check In:  Session Check In - 12/24/23 1724       Check-In   Supervising physician immediately available to respond to emergencies See telemetry face sheet for immediately available ER MD    Physician(s) Dr. Scotty Court and Ray    Location [facility]-Cardiac & Pulmonary Rehab    Staff Present Susann Givens RN,BSN;Joseph [facility] BS, ACSM CEP, Exercise Physiologist    Virtual Visit No    Medication changes reported     No    Fall or balance concerns reported    No    Tobacco Cessation No Change    Current number of cigarettes/nicotine per day     2    Warm-up and Cool-down Performed on first and last piece of equipment    Resistance Training Performed Yes    VAD Patient? No    PAD/SET Patient? Yes      PAD/SET Patient   Completed foot check today? Yes    Open wounds to report? No      Pain Assessment   Currently in Pain? No/denies                Social History   Tobacco Use  Smoking Status Every Day   Current packs/day: 0.00   Average packs/day: 0.2 packs/day for 55.4 years (11.1 ttl pk-yrs)   Types: Pipe, Cigarettes   Start date: 09/04/1966   Last attempt to quit: 02/08/2022   Years since quitting: 1.8  Smokeless Tobacco Never  Tobacco Comments   Smokes 7 cigarettes in a week.     Goals Met:  Independence with exercise equipment Exercise tolerated well No report of concerns or symptoms today Strength training completed today  Goals Unmet:  Not Applicable  Comments: Pt able to follow exercise prescription today without complaint.  Will continue to monitor for progression.    Dr. Bethann Punches is Medical Director for [facility] Cardiac Rehabilitation.  Dr. Vida Rigger is Medical Director for [facility] Pulmonary Rehabilitation.

## 2023-12-25 ENCOUNTER — Encounter: Admitting: *Deleted

## 2023-12-25 DIAGNOSIS — I70213 Atherosclerosis of native arteries of extremities with intermittent claudication, bilateral legs: Secondary | ICD-10-CM | POA: Diagnosis not present

## 2023-12-25 DIAGNOSIS — I739 Peripheral vascular disease, unspecified: Secondary | ICD-10-CM

## 2023-12-25 NOTE — Progress Notes (Signed)
 Daily Session Note  Patient Details  Name: Daniel Fuentes MRN: [id number] Date of Birth: 09-07-1951 Referring Provider:   Flowsheet Row Cardiac Rehab from 12/01/2023 in [facility] Cardiac and Pulmonary Rehab  Referring Provider Dr. Lorine Bears       Encounter Date: 12/25/2023  Check In:  Session Check In - 12/25/23 1735       Check-In   Supervising physician immediately available to respond to emergencies See telemetry face sheet for immediately available ER MD    Physician(s) Dr. Scotty Court and [facility]    Location [facility]-Cardiac & Pulmonary Rehab    Staff Present Susann Givens RN,BSN;Maxon Conetta BS, Exercise Physiologist;Joseph Reino Kent RCP,RRT,BSRT    Virtual Visit No    Medication changes reported     No    Fall or balance concerns reported    No    Tobacco Cessation No Change    Current number of cigarettes/nicotine per day     2    Warm-up and Cool-down Performed on first and last piece of equipment    Resistance Training Performed Yes    VAD Patient? No    PAD/SET Patient? Yes      PAD/SET Patient   Completed foot check today? Yes    Open wounds to report? No      Pain Assessment   Currently in Pain? No/denies                Social History   Tobacco Use  Smoking Status Every Day   Current packs/day: 0.00   Average packs/day: 0.2 packs/day for 55.4 years (11.1 ttl pk-yrs)   Types: Pipe, Cigarettes   Start date: 09/04/1966   Last attempt to quit: 02/08/2022   Years since quitting: 1.8  Smokeless Tobacco Never  Tobacco Comments   Smokes 7 cigarettes in a week.     Goals Met:  Independence with exercise equipment Exercise tolerated well No report of concerns or symptoms today Strength training completed today  Goals Unmet:  Not Applicable  Comments: Pt able to follow exercise prescription today without complaint.  Will continue to monitor for progression.    Dr. Bethann Punches is Medical Director for [facility] Cardiac Rehabilitation.  Dr. Vida Rigger  is Medical Director for [facility] Pulmonary Rehabilitation.

## 2023-12-29 ENCOUNTER — Encounter: Admitting: *Deleted

## 2023-12-29 DIAGNOSIS — I70213 Atherosclerosis of native arteries of extremities with intermittent claudication, bilateral legs: Secondary | ICD-10-CM | POA: Diagnosis not present

## 2023-12-29 DIAGNOSIS — I739 Peripheral vascular disease, unspecified: Secondary | ICD-10-CM

## 2023-12-29 NOTE — Progress Notes (Signed)
 Daily Session Note  Patient Details  Name: Daniel Fuentes MRN: [id number] Date of Birth: October 31, 1950 Referring Provider:   Flowsheet Row Cardiac Rehab from 12/01/2023 in [facility] Cardiac and Pulmonary Rehab  Referring Provider Dr. Lorine Bears       Encounter Date: 12/29/2023  Check In:  Session Check In - 12/29/23 1715       Check-In   Supervising physician immediately available to respond to emergencies PAD/SET Supervising Physician    Physician(s) Dr Jodie Echevaria and Anner Crete    Location [facility]-Cardiac & Pulmonary Rehab    Staff Present Susann Givens RN,BSN;Kelly Madilyn Fireman BS, ACSM CEP, Exercise Physiologist;Joseph Reino Kent RCP,RRT,BSRT    Virtual Visit No    Medication changes reported     No    Fall or balance concerns reported    No    Tobacco Cessation Use Increase    Current number of cigarettes/nicotine per day     4    Warm-up and Cool-down Performed on first and last piece of equipment    Resistance Training Performed Yes    VAD Patient? No    PAD/SET Patient? Yes      PAD/SET Patient   Completed foot check today? Yes    Open wounds to report? No      Pain Assessment   Currently in Pain? No/denies                Social History   Tobacco Use  Smoking Status Every Day   Current packs/day: 0.00   Average packs/day: 0.2 packs/day for 55.4 years (11.1 ttl pk-yrs)   Types: Pipe, Cigarettes   Start date: 09/04/1966   Last attempt to quit: 02/08/2022   Years since quitting: 1.8  Smokeless Tobacco Never  Tobacco Comments   Smokes 7 cigarettes in a week.     Goals Met:  Independence with exercise equipment Exercise tolerated well No report of concerns or symptoms today Strength training completed today  Goals Unmet:  Not Applicable  Comments: Pt able to follow exercise prescription today without complaint.  Will continue to monitor for progression.    Dr. Bethann Punches is Medical Director for [facility] Cardiac Rehabilitation.  Dr. Vida Rigger is Medical Director for  [facility] Pulmonary Rehabilitation.

## 2023-12-31 ENCOUNTER — Encounter: Attending: Internal Medicine | Admitting: *Deleted

## 2023-12-31 DIAGNOSIS — I739 Peripheral vascular disease, unspecified: Secondary | ICD-10-CM | POA: Insufficient documentation

## 2023-12-31 DIAGNOSIS — I252 Old myocardial infarction: Secondary | ICD-10-CM | POA: Diagnosis not present

## 2023-12-31 DIAGNOSIS — Z955 Presence of coronary angioplasty implant and graft: Secondary | ICD-10-CM | POA: Diagnosis present

## 2023-12-31 DIAGNOSIS — I214 Non-ST elevation (NSTEMI) myocardial infarction: Secondary | ICD-10-CM | POA: Diagnosis present

## 2023-12-31 DIAGNOSIS — Z48812 Encounter for surgical aftercare following surgery on the circulatory system: Secondary | ICD-10-CM | POA: Insufficient documentation

## 2023-12-31 DIAGNOSIS — I70213 Atherosclerosis of native arteries of extremities with intermittent claudication, bilateral legs: Secondary | ICD-10-CM | POA: Diagnosis present

## 2023-12-31 NOTE — Progress Notes (Signed)
 Daily Session Note  Patient Details  Name: Daniel Fuentes MRN: [id number] Date of Birth: Feb 03, 1951 Referring Provider:   Flowsheet Row Cardiac Rehab from 12/01/2023 in [facility] Cardiac and Pulmonary Rehab  Referring Provider Dr. Lorine Bears       Encounter Date: 12/31/2023  Check In:  Session Check In - 12/31/23 1715       Check-In   Supervising physician immediately available to respond to emergencies See telemetry face sheet for immediately available ER MD    Physician(s) Dr. Derrill Kay and Vicente Males    Location [facility]-Cardiac & Pulmonary Rehab    Staff Present Susann Givens RN,BSN;Joseph [facility] BS, ACSM CEP, Exercise Physiologist    Virtual Visit No    Medication changes reported     No    Fall or balance concerns reported    No    Tobacco Cessation No Change    Current number of cigarettes/nicotine per day     4    Warm-up and Cool-down Performed on first and last piece of equipment    Resistance Training Performed Yes    VAD Patient? No    PAD/SET Patient? Yes      PAD/SET Patient   Completed foot check today? Yes    Open wounds to report? No      Pain Assessment   Currently in Pain? No/denies                Social History   Tobacco Use  Smoking Status Every Day   Current packs/day: 0.00   Average packs/day: 0.2 packs/day for 55.4 years (11.1 ttl pk-yrs)   Types: Pipe, Cigarettes   Start date: 09/04/1966   Last attempt to quit: 02/08/2022   Years since quitting: 1.8  Smokeless Tobacco Never  Tobacco Comments   Smokes 7 cigarettes in a week.     Goals Met:  Independence with exercise equipment Exercise tolerated well No report of concerns or symptoms today Strength training completed today  Goals Unmet:  Not Applicable  Comments: Pt able to follow exercise prescription today without complaint.  Will continue to monitor for progression.    Dr. Bethann Punches is Medical Director for [facility] Cardiac Rehabilitation.  Dr. Vida Rigger is Medical Director for [facility] Pulmonary Rehabilitation.

## 2024-01-01 ENCOUNTER — Ambulatory Visit: Attending: Cardiovascular Disease

## 2024-01-01 ENCOUNTER — Encounter: Admitting: *Deleted

## 2024-01-01 DIAGNOSIS — Z48812 Encounter for surgical aftercare following surgery on the circulatory system: Secondary | ICD-10-CM | POA: Diagnosis not present

## 2024-01-01 DIAGNOSIS — I7143 Infrarenal abdominal aortic aneurysm, without rupture: Secondary | ICD-10-CM

## 2024-01-01 DIAGNOSIS — I739 Peripheral vascular disease, unspecified: Secondary | ICD-10-CM

## 2024-01-01 DIAGNOSIS — I714 Abdominal aortic aneurysm, without rupture, unspecified: Secondary | ICD-10-CM

## 2024-01-01 NOTE — Progress Notes (Signed)
 Daily Session Note  Patient Details  Name: Arlin Sass MRN: [id number] Date of Birth: September 19, 1951 Referring Provider:   Flowsheet Row Cardiac Rehab from 12/01/2023 in [facility] Cardiac and Pulmonary Rehab  Referring Provider Dr. Lorine Bears       Encounter Date: 01/01/2024  Check In:  Session Check In - 01/01/24 1720       Check-In   Supervising physician immediately available to respond to emergencies PAD/SET Supervising Physician    Physician(s) Dr. Derrill Kay and Ray    Location [facility]-Cardiac & Pulmonary Rehab    Staff Present Maxon Conetta BS, Exercise Physiologist;Vella Colquitt Jewel Baize RN,BSN;Joseph Reino Kent RCP,RRT,BSRT    Virtual Visit No    Medication changes reported     No    Fall or balance concerns reported    No    Tobacco Cessation No Change    Current number of cigarettes/nicotine per day     4    Warm-up and Cool-down Performed on first and last piece of equipment    Resistance Training Performed Yes    VAD Patient? No    PAD/SET Patient? Yes      PAD/SET Patient   Completed foot check today? Yes    Open wounds to report? No      Pain Assessment   Currently in Pain? No/denies                Social History   Tobacco Use  Smoking Status Every Day   Current packs/day: 0.00   Average packs/day: 0.2 packs/day for 55.4 years (11.1 ttl pk-yrs)   Types: Pipe, Cigarettes   Start date: 09/04/1966   Last attempt to quit: 02/08/2022   Years since quitting: 1.8  Smokeless Tobacco Never  Tobacco Comments   Smokes 7 cigarettes in a week.     Goals Met:  Independence with exercise equipment Exercise tolerated well No report of concerns or symptoms today Strength training completed today  Goals Unmet:  Not Applicable  Comments: Pt able to follow exercise prescription today without complaint.  Will continue to monitor for progression.    Dr. Bethann Punches is Medical Director for [facility] Cardiac Rehabilitation.  Dr. Vida Rigger is Medical Director for [facility]  Pulmonary Rehabilitation.

## 2024-01-05 ENCOUNTER — Encounter: Admitting: *Deleted

## 2024-01-05 DIAGNOSIS — I739 Peripheral vascular disease, unspecified: Secondary | ICD-10-CM

## 2024-01-05 DIAGNOSIS — Z48812 Encounter for surgical aftercare following surgery on the circulatory system: Secondary | ICD-10-CM | POA: Diagnosis not present

## 2024-01-05 NOTE — Progress Notes (Signed)
 Daily Session Note  Patient Details  Name: Daniel Fuentes MRN: [id number] Date of Birth: 10-13-1950 Referring Provider:   Flowsheet Row Cardiac Rehab from 12/01/2023 in [facility] Cardiac and Pulmonary Rehab  Referring Provider Dr. Lorine Bears       Encounter Date: 01/05/2024  Check In:  Session Check In - 01/05/24 1721       Check-In   Supervising physician immediately available to respond to emergencies See telemetry face sheet for immediately available ER MD    Physician(s) Dr. Fuller Plan and [facility]    Location [facility]-Cardiac & Pulmonary Rehab    Staff Present Susann Givens RN,BSN;Kelly Madilyn Fireman BS, ACSM CEP, Exercise Physiologist;Joseph Reino Kent RCP,RRT,BSRT    Virtual Visit No    Medication changes reported     No    Fall or balance concerns reported    No    Tobacco Cessation No Change    Current number of cigarettes/nicotine per day     4    Warm-up and Cool-down Performed on first and last piece of equipment    Resistance Training Performed Yes    VAD Patient? No    PAD/SET Patient? Yes      PAD/SET Patient   Completed foot check today? Yes    Open wounds to report? No      Pain Assessment   Currently in Pain? No/denies                Social History   Tobacco Use  Smoking Status Every Day   Current packs/day: 0.00   Average packs/day: 0.2 packs/day for 55.4 years (11.1 ttl pk-yrs)   Types: Pipe, Cigarettes   Start date: 09/04/1966   Last attempt to quit: 02/08/2022   Years since quitting: 1.9  Smokeless Tobacco Never  Tobacco Comments   Smokes 7 cigarettes in a week.     Goals Met:  Independence with exercise equipment Exercise tolerated well No report of concerns or symptoms today Strength training completed today  Goals Unmet:  Not Applicable  Comments: Pt able to follow exercise prescription today without complaint.  Will continue to monitor for progression.    Dr. Bethann Punches is Medical Director for [facility] Cardiac Rehabilitation.  Dr. Vida Rigger is Medical Director for [facility] Pulmonary Rehabilitation.

## 2024-01-07 ENCOUNTER — Encounter: Payer: Self-pay | Admitting: *Deleted

## 2024-01-07 ENCOUNTER — Encounter: Admitting: *Deleted

## 2024-01-07 DIAGNOSIS — Z48812 Encounter for surgical aftercare following surgery on the circulatory system: Secondary | ICD-10-CM | POA: Diagnosis not present

## 2024-01-07 DIAGNOSIS — I739 Peripheral vascular disease, unspecified: Secondary | ICD-10-CM

## 2024-01-07 NOTE — Progress Notes (Signed)
 Daily Session Note  Patient Details  Name: Daniel Fuentes MRN: [id number] Date of Birth: 1951-05-25 Referring Provider:   Flowsheet Row Cardiac Rehab from 12/01/2023 in [facility] Cardiac and Pulmonary Rehab  Referring Provider Dr. Lorine Bears       Encounter Date: 01/07/2024  Check In:  Session Check In - 01/07/24 1734       Check-In   Supervising physician immediately available to respond to emergencies See telemetry face sheet for immediately available ER MD    Physician(s) Dr. Roxan Hockey and Larinda Buttery    Location [facility]-Cardiac & Pulmonary Rehab    Staff Present Susann Givens RN,BSN;Susanne Bice, RN, BSN, CCRP;Joseph Hood RCP,RRT,BSRT    Virtual Visit No    Medication changes reported     No    Fall or balance concerns reported    No    Tobacco Cessation No Change    Current number of cigarettes/nicotine per day     4    Warm-up and Cool-down Performed on first and last piece of equipment    Resistance Training Performed Yes    VAD Patient? No    PAD/SET Patient? Yes      PAD/SET Patient   Completed foot check today? Yes    Open wounds to report? No      Pain Assessment   Currently in Pain? No/denies                Social History   Tobacco Use  Smoking Status Every Day   Current packs/day: 0.00   Average packs/day: 0.2 packs/day for 55.4 years (11.1 ttl pk-yrs)   Types: Pipe, Cigarettes   Start date: 09/04/1966   Last attempt to quit: 02/08/2022   Years since quitting: 1.9  Smokeless Tobacco Never  Tobacco Comments   Smokes 7 cigarettes in a week.     Goals Met:  Independence with exercise equipment Exercise tolerated well No report of concerns or symptoms today Strength training completed today  Goals Unmet:  Not Applicable  Comments: Pt able to follow exercise prescription today without complaint.  Will continue to monitor for progression.    Dr. Bethann Punches is Medical Director for [facility] Cardiac Rehabilitation.  Dr. Vida Rigger is Medical  Director for [facility] Pulmonary Rehabilitation.

## 2024-01-07 NOTE — Progress Notes (Signed)
 PAD/SET  Plan of Care  Patient Details  Name: Daniel Fuentes MRN: [id number] Date of Birth: Jul 03, 1951 Referring Provider:   Flowsheet Row Cardiac Rehab from 12/01/2023 in [facility] Cardiac and Pulmonary Rehab  Referring Provider Dr. Lorine Bears       Initial Encounter Date:  Flowsheet Row Cardiac Rehab from 12/01/2023 in [facility] Cardiac and Pulmonary Rehab  Date 12/01/23       Visit Diagnosis: PAD (peripheral artery disease) (HCC)  Patient's Home Medications on Admission:  Current Outpatient Medications:    amLODipine (NORVASC) 10 MG tablet, Take 1 tablet by mouth once daily, Disp: 90 tablet, Rfl: 0   aspirin EC 81 MG tablet, Take 1 tablet (81 mg total) by mouth daily., Disp: 30 tablet, Rfl: 0   carvedilol (COREG) 6.25 MG tablet, TAKE 1 TABLET BY MOUTH TWICE DAILY WITH A MEAL, Disp: 180 tablet, Rfl: 3   Cholecalciferol (VITAMIN D3) 2000 units capsule, Take 2,000 Units by mouth daily., Disp: , Rfl:    clopidogrel (PLAVIX) 75 MG tablet, Take 1 tablet by mouth once daily with breakfast, Disp: 90 tablet, Rfl: 3   Evolocumab (REPATHA SURECLICK) 140 MG/ML SOAJ, Inject 140 mg into the skin every 14 (fourteen) days., Disp: 2 mL, Rfl: 11   losartan (COZAAR) 25 MG tablet, Take 1 tablet by mouth once daily, Disp: 90 tablet, Rfl: 3   metFORMIN (GLUCOPHAGE) 1000 MG tablet, Take 1 tablet (1,000 mg total) by mouth daily. Start tomorrow, Disp: , Rfl:    Multiple Vitamins-Minerals (MULTIVITAMIN WITH MINERALS) tablet, Take 1 tablet by mouth daily., Disp: , Rfl:    nitroGLYCERIN (NITROSTAT) 0.4 MG SL tablet, Place 1 tablet (0.4 mg total) under the tongue every 5 (five) minutes as needed for chest pain., Disp: 30 tablet, Rfl: 0   solifenacin (VESICARE) 10 MG tablet, Take 10 mg by mouth daily., Disp: , Rfl:    tamsulosin (FLOMAX) 0.4 MG CAPS capsule, Take 0.4 mg by mouth daily., Disp: , Rfl:    vitamin C (ASCORBIC ACID) 500 MG tablet, Take 500 mg by mouth., Disp: , Rfl:   Past Medical History: Past Medical  History:  Diagnosis Date   CAD (coronary artery disease)    a. 01/2022 NSTEMI/PCI: LM nl, LAD 55m, D1 50, D3 90, LCX 63m, 95/60d, OM2 80, RCA 100p/m, 100m, 70d (3.5x12, 3.0x38, & 3.5x8 Onyx Frontier DES), RPAV1 90 (2.0x12 Onyx Frontier DES), RPAV2 99, RPL1 50, RPL2 99. EF 50-55%; b. 01/2022 Staged PCI: LCX (3.5x18 & 3.0x38 Onyx Frontier DES), OM2 (3.0x22 Onyx Frontier DES).   CKD (chronic kidney disease), stage II    Diabetes mellitus without complication (HCC)    Hyperlipidemia    Hypertension    Ischemic cardiomyopathy    a. 01/2022 Echo: EF 50-55%, mod LVH, GRI DD, mild basal-mid inf/infsept HK. Mildly reduced RV fxn, Mildly dil RA. Mild MR. MIld-mod TR. AoV sclerosis w/o stenosis.    Tobacco Use: Social History   Tobacco Use  Smoking Status Every Day   Current packs/day: 0.00   Average packs/day: 0.2 packs/day for 55.4 years (11.1 ttl pk-yrs)   Types: Pipe, Cigarettes   Start date: 09/04/1966   Last attempt to quit: 02/08/2022   Years since quitting: 1.9  Smokeless Tobacco Never  Tobacco Comments   Smokes 7 cigarettes in a week.     Labs: Review Flowsheet  More data exists      Latest Ref Rng & Units 05/27/2017 12/26/2017 05/14/2018 02/08/2022 03/13/2022  Labs for ITP Cardiac and Pulmonary Rehab  Cholestrol  0 - 200 mg/dL 161  096  - 045  409   LDL (calc) 0 - 99 mg/dL 811  914  - 782  82   HDL-C >40 mg/dL 40  37  - 48  49   Trlycerides <150 mg/dL 956  213  - 086  53   Hemoglobin A1c 4.8 - 5.6 % - 8.4  7.1  5.9  -     Exercise Target Goals: Exercise Program Goal: Individual exercise prescription set with THRR, safety & activity barriers. Participant demonstrates ability to understand and report RPE using RPE 6-20 scale, to self-measure pulse accurately, and to acknowledge the importance of the exercise prescription.  Exercise Prescription Goal: Use initial exercise assessment to set exercise prescription to improve time and distance to onset of claudication pain. Provide  education to aid in steps toward risk factor modification, improve quality of life with claudication, and utilize THRR and exercise prescription for best results for decreasing symptoms of PAD. Prevent injury to bone, joints, and skin integrity during the exercise through checks of each system during session check in. Following physician guidelines for any contraindications to specific exercises.  Activity Barriers:  Activity Barriers & Cardiac Risk Stratification - 12/01/23 1550       Activity Barriers & Cardiac Risk Stratification   Activity Barriers Other (comment)    Comments AAA BP restrictions >140/90 unless MD provides other restrictions             Gardner Treadmill Test Assessment:  Treadmill Test     Row Name 12/01/23 1546       Treadmill Test   Phase Pre    Time to Claudication Onset 4 minutes    Total Time to 3-4/5 Claudication Pain 8 minutes      Heart Rate   Rest 83 bpm    0% Grade 92 bpm    2% Grade 96 bpm    4% Grade 99 bpm    6% Grade 108 bpm    2 Min Post 74 bpm      Blood Pressure   Rest 122/80    0% 136/80    2% Grade 158/80    4% Grade 164/84    6% Grade 168/86    2 Min Post 128/82      Claudication Score   Rest 1  numbness    0% 1    2% Grade 2    4% Grade 2    6% Grade 4    2 Min Post 1      RPE   0% 13    2% Grade 13    4% Grade 13    6% Grade 15      Time in Stage   0% 2 minutes    2% Grade 2 minutes    4% Grade 2 minutes    6% Grade 2 minutes             Walking Impairment Questionnaire:   Expectation is that scores will be within the SD range. Distance Pre % Score Mean: 38% (SD 12% - 0.64%)   Post % Score Mean: 55% (SD 26% - 84%)   Mean % Change: 18% (SD (-10%) - (46%))  Speed Pre % Score Mean: 41% (SD 19% - 63%)   Post % Score Mean: 52% (SD 30% - 74%)   Mean % Change: 11% (SD (-9%) - (31%))  Stairs Pre % Score Mean: 55% (SD 23% - 87%)   Post % Score  Mean: 68% (SD 39% - 97%)   Mean % Change: 14% (SD (-15%) -  (43%))  Total Score Pre % Score Mean: 45% (SD 23% - 67%   Post % Score Mean: 58% (SD 36% - 80%)   Mean % Change: 14% (SD (-5%) - (43%))   Oxygen Initial Assessment:   Oxygen Re-Evaluation:   Oxygen Discharge (Final Oxygen Re-Evaluation):   Initial Exercise Prescription:  Initial Exercise Prescription - 12/01/23 1500       Date of Initial Exercise RX and Referring Provider   Date 12/01/23    Referring Provider Dr. Lorine Bears      Oxygen   Maintain Oxygen Saturation 88% or higher      Treadmill   MPH 2    Grade 2    Minutes 15    METs 3.08      NuStep   Level 3    SPM 80    Minutes 15    METs 3      Biostep-RELP   Level 3    SPM 50    Minutes 15    METs 3      Prescription Details   Frequency (times per week) 3    Duration Progress to 30 minutes of continuous aerobic without signs/symptoms of physical distress      Intensity   THRR 40-80% of Max Heartrate 108-133    Ratings of Perceived Exertion 11-13    Perceived Dyspnea 0-4      Progression   Progression Continue to follow PAD protocol      Resistance Training   Training Prescription Yes    Weight 4    Reps 10-15             Perform Capillary Blood Glucose checks as needed.  Exercise Prescription Changes:   Exercise Prescription Changes     Row Name 12/01/23 1500 12/11/23 1500 12/25/23 0700         Response to Exercise   Blood Pressure (Admit) 122/80 132/64 138/68     Blood Pressure (Exercise) 168/86 -- 158/80     Blood Pressure (Exit) 128/82 134/68 128/70     Heart Rate (Admit) 83 bpm 70 bpm 63 bpm     Heart Rate (Exercise) 108 bpm 100 bpm 109 bpm     Heart Rate (Exit) 74 bpm 93 bpm 70 bpm     Oxygen Saturation (Admit) 98 % 95 % 98 %     Oxygen Saturation (Exercise) 97 % 92 % 91 %     Oxygen Saturation (Exit) 97 % 95 % 91 %     Rating of Perceived Exertion (Exercise) 15 12 13      Perceived Dyspnea (Exercise) -- -- 0     Symptoms PAD pain 4/5 PAD pain 4/5 on cps PAD pain 4/5  on CPS     Comments PAD walk test First day of exercise --     Duration -- Progress to 30 minutes of  aerobic without signs/symptoms of physical distress Progress to 30 minutes of  aerobic without signs/symptoms of physical distress     Intensity -- THRR unchanged THRR unchanged       Progression   Progression -- Continue to progress workloads to maintain intensity without signs/symptoms of physical distress. Continue to progress workloads to maintain intensity without signs/symptoms of physical distress.     Average METs -- 3.08 3.92       Resistance Training   Training Prescription -- Yes Yes  Weight -- 4 lb 4 lb     Reps -- 10-15 10-15       Interval Training   Interval Training -- No No       Oxygen   Oxygen -- Continuous --       Treadmill   MPH -- 2 2.2     Grade -- 2 6     Minutes -- 15 15     METs -- 3.08 4.51       Oxygen   Maintain Oxygen Saturation -- 88% or higher 88% or higher              Exercise Comments:   Exercise Comments     Row Name 12/04/23 1751           Exercise Comments First full day of exercise!  Patient was oriented to gym and equipment including functions, settings, policies, and procedures.  Patient's individual exercise prescription and treatment plan were reviewed.  All starting workloads were established based on the results of the 6 minute walk test done at initial orientation visit.  The plan for exercise progression was also introduced and progression will be customized based on patient's performance and goals.                Exercise Goals and Review:   Exercise Goals     Row Name 12/01/23 1556             Exercise Goals   Increase Physical Activity Yes       Intervention Provide advice, education, support and counseling about physical activity/exercise needs.;Develop an individualized exercise prescription for aerobic and resistive training based on initial evaluation findings, risk stratification, comorbidities  and participant's personal goals.       Expected Outcomes Short Term: Attend rehab on a regular basis to increase amount of physical activity.;Long Term: Add in home exercise to make exercise part of routine and to increase amount of physical activity.;Long Term: Exercising regularly at least 3-5 days a week.       Increase Strength and Stamina Yes       Intervention Provide advice, education, support and counseling about physical activity/exercise needs.;Develop an individualized exercise prescription for aerobic and resistive training based on initial evaluation findings, risk stratification, comorbidities and participant's personal goals.       Expected Outcomes Short Term: Increase workloads from initial exercise prescription for resistance, speed, and METs.;Short Term: Perform resistance training exercises routinely during rehab and add in resistance training at home;Long Term: Improve cardiorespiratory fitness, muscular endurance and strength as measured by increased METs and functional capacity ( )       Able to understand and use rate of perceived exertion (RPE) scale Yes       Intervention Provide education and explanation on how to use RPE scale       Expected Outcomes Short Term: Able to use RPE daily in rehab to express subjective intensity level;Long Term:  Able to use RPE to guide intensity level when exercising independently       Able to understand and use Dyspnea scale Yes       Intervention Provide education and explanation on how to use Dyspnea scale       Expected Outcomes Short Term: Able to use Dyspnea scale daily in rehab to express subjective sense of shortness of breath during exertion;Long Term: Able to use Dyspnea scale to guide intensity level when exercising independently       Knowledge and understanding of  Target Heart Rate Range (THRR) Yes       Intervention Provide education and explanation of THRR including how the numbers were predicted and where they are located for  reference       Expected Outcomes Short Term: Able to state/look up THRR;Long Term: Able to use THRR to govern intensity when exercising independently;Short Term: Able to use daily as guideline for intensity in rehab       Able to check pulse independently Yes       Intervention Review the importance of being able to check your own pulse for safety during independent exercise;Provide education and demonstration on how to check pulse in carotid and radial arteries.       Expected Outcomes Short Term: Able to explain why pulse checking is important during independent exercise;Long Term: Able to check pulse independently and accurately       Understanding of Exercise Prescription Yes       Intervention Provide education, explanation, and written materials on patient's individual exercise prescription       Expected Outcomes Short Term: Able to explain program exercise prescription;Long Term: Able to explain home exercise prescription to exercise independently       Improve claudication pain toleration; Improve walking ability Yes       Intervention Participate in PAD/SET Rehab 2-3 days a week, walking at home as part of exercise prescription;Attend education sessions to aid in risk factor modification and understanding of disease process       Expected Outcomes Short Term: Improve walking distance/time to onset of claudication pain;Long Term: Improve score of PAD questionnaires                Exercise Goals Re-Evaluation :  Exercise Goals Re-Evaluation     Row Name 12/04/23 1751 12/11/23 1507 12/25/23 0758         Exercise Goal Re-Evaluation   Exercise Goals Review Increase Physical Activity;Able to understand and use rate of perceived exertion (RPE) scale;Knowledge and understanding of Target Heart Rate Range (THRR);Understanding of Exercise Prescription;Increase Strength and Stamina;Improve claudication pain tolerance and improve walking ability;Able to check pulse independently Increase  Physical Activity;Increase Strength and Stamina;Understanding of Exercise Prescription Increase Physical Activity;Increase Strength and Stamina;Understanding of Exercise Prescription     Comments Reviewed RPE and dyspnea scale, THR and program prescription with pt today.  Pt voiced understanding and was given a copy of goals to take home. Daniel Fuentes is off to a good start in the program. He has been doing well walking the treadmill as tolerated by claudication pain due to PAD. He has done well walking at 2 mph with an incline of 2% on the treadmill with occasional pauses for symptom relief. We will continue to monitor his progress in the program. Daniel Fuentes is doing well in rehab. He has been able to progress to an incline of 6% while stating a CPS of 4/5. We will continue to increase incline, and monitor his progress in the program.     Expected Outcomes Short: Use RPE daily to regulate intensity.  Long: Follow program prescription in THR. Short: Continue to follow current exercise prescription. Long: Continue exercise to improve strength and stamina. Short: Continue to increase incline with respect to PAD. Long: Continue exercise to improve strength and stamina.              Discharge Exercise Prescription (Final Exercise Prescription Changes):  Exercise Prescription Changes - 12/25/23 0700       Response to Exercise   Blood  Pressure (Admit) 138/68    Blood Pressure (Exercise) 158/80    Blood Pressure (Exit) 128/70    Heart Rate (Admit) 63 bpm    Heart Rate (Exercise) 109 bpm    Heart Rate (Exit) 70 bpm    Oxygen Saturation (Admit) 98 %    Oxygen Saturation (Exercise) 91 %    Oxygen Saturation (Exit) 91 %    Rating of Perceived Exertion (Exercise) 13    Perceived Dyspnea (Exercise) 0    Symptoms PAD pain 4/5 on CPS    Duration Progress to 30 minutes of  aerobic without signs/symptoms of physical distress    Intensity THRR unchanged      Progression   Progression Continue to progress workloads to  maintain intensity without signs/symptoms of physical distress.    Average METs 3.92      Resistance Training   Training Prescription Yes    Weight 4 lb    Reps 10-15      Interval Training   Interval Training No      Treadmill   MPH 2.2    Grade 6    Minutes 15    METs 4.51      Oxygen   Maintain Oxygen Saturation 88% or higher             Nutrition:  Target Goals: Understanding of nutrition guidelines, daily intake of sodium 1500mg , cholesterol 200mg , calories 30% from fat and 7% or less from saturated fats, daily to have 5 or more servings of fruits and vegetables.  Biometrics:  Pre Biometrics - 12/01/23 1557       Pre Biometrics   Height 5' 7.75" (1.721 m)    Weight 173 lb 8 oz (78.7 kg)    Waist Circumference 37 inches    Hip Circumference 38 inches    Waist to Hip Ratio 0.97 %    BMI (Calculated) 26.57    Single Leg Stand 30 seconds              Nutrition Therapy Plan and Nutrition Goals:  Nutrition Therapy & Goals - 12/01/23 1558       Nutrition Therapy   RD appointment deferred Yes             Nutrition Discharge: Rate Your Plate Scores:   Nutrition Goals Re-Evaluation:  Nutrition Goals Re-Evaluation     Row Name 12/29/23 1741             Goals   Comment Patient deferred RD appontment.                Nutrition Goals Discharge (Final Nutrition Goals Re-Evaluation):  Nutrition Goals Re-Evaluation - 12/29/23 1741       Goals   Comment Patient deferred RD appontment.             Psychosocial: Target Goals: Acknowledge presence or absence of significant depression and/or stress, maximize coping skills, provide positive support system. Participant is able to verbalize types and ability to use techniques and skills needed for reducing stress and depression.   Initial Review & Psychosocial Screening:  Initial Psych Review & Screening - 12/01/23 0938       Initial Review   Current issues with None Identified       Family Dynamics   Good Support System? Yes    Comments Keiton can look to his wife who is a Engineer, civil (consulting) for his main support. He does not take anything for his mood.      Barriers  Psychosocial barriers to participate in program The patient should benefit from training in stress management and relaxation.;There are no identifiable barriers or psychosocial needs.      Screening Interventions   Interventions Encouraged to exercise;Program counselor consult;To provide support and resources with identified psychosocial needs;Provide feedback about the scores to participant    Expected Outcomes Short Term goal: Utilizing psychosocial counselor, staff and physician to assist with identification of specific Stressors or current issues interfering with healing process. Setting desired goal for each stressor or current issue identified.;Long Term Goal: Stressors or current issues are controlled or eliminated.;Short Term goal: Identification and review with participant of any Quality of Life or Depression concerns found by scoring the questionnaire.;Long Term goal: The participant improves quality of Life and PHQ9 Scores as seen by post scores and/or verbalization of changes             Quality of Life Scores:    Scores should be at or above lower SD. Change of 5 points or better indicates improvement in first 5 Factors. Maximum score for last three Factors is 6.  Score Interpretation Lower SD  Social Relationships and Interactions 23.99  Self-Concept and Feelings 17.79  Symptoms and Limitations 11.75  Fear and Uncertainty 8.96  Positive Adaptation 22.29  Job 2.08  Sexual Function 1.43  Intimate Mean 2.08   PHQ-9: Review Flowsheet  More data exists      12/01/2023 06/25/2022 03/04/2022 05/14/2018 01/06/2017  Depression screen PHQ 2/9  Decreased Interest 0 0 0 0 0  Down, Depressed, Hopeless 0 0 0 0 0  PHQ - 2 Score 0 0 0 0 0  Altered sleeping 0 1 1 0 -  Tired, decreased energy 2 1 1  0 -  Change  in appetite 0 1 0 0 -  Feeling bad or failure about yourself  0 0 0 0 -  Trouble concentrating 0 1 1 0 -  Moving slowly or fidgety/restless 0 0 0 0 -  Suicidal thoughts 0 0 0 0 -  PHQ-9 Score 2 4 3  0 -  Difficult doing work/chores Not difficult at all Not difficult at all Somewhat difficult Not difficult at all -   Interpretation of Total Score  Total Score Depression Severity:  1-4 = Minimal depression, 5-9 = Mild depression, 10-14 = Moderate depression, 15-19 = Moderately severe depression, 20-27 = Severe depression   Psychosocial Evaluation and Intervention:  Psychosocial Evaluation - 12/01/23 0939       Psychosocial Evaluation & Interventions   Interventions Therapist referral;Physician referral;Stress management education;Encouraged to exercise with the program and follow exercise prescription    Comments Adan can look to his wife who is a Engineer, civil (consulting) for his main support. He does not take anything for his mood.    Expected Outcomes Short: Attend [facility] stress management education to decrease stress. Long: Maintain exercise Post [facility] to keep stress at a minimum.    Continue Psychosocial Services  Follow up required by staff             Psychosocial Re-Evaluation:  Psychosocial Re-Evaluation     Row Name 12/29/23 1742             Psychosocial Re-Evaluation   Current issues with None Identified       Comments Patient reports no issues with their current mental states, sleep, stress, depression or anxiety. Will follow up with patient in a few weeks for any changes.       Expected Outcomes Short: Continue to exercise regularly  to support mental health and notify staff of any changes. Long: maintain mental health and well being through teaching of rehab or prescribed medications independently.       Interventions Encouraged to attend Cardiac Rehabilitation for the exercise       Continue Psychosocial Services  Follow up required by staff                 Psychosocial Discharge (Final Psychosocial Re-Evaluation):  Psychosocial Re-Evaluation - 12/29/23 1742       Psychosocial Re-Evaluation   Current issues with None Identified    Comments Patient reports no issues with their current mental states, sleep, stress, depression or anxiety. Will follow up with patient in a few weeks for any changes.    Expected Outcomes Short: Continue to exercise regularly to support mental health and notify staff of any changes. Long: maintain mental health and well being through teaching of rehab or prescribed medications independently.    Interventions Encouraged to attend Cardiac Rehabilitation for the exercise    Continue Psychosocial Services  Follow up required by staff             Vocational Rehabilitation: Provide vocational rehab assistance to qualifying candidates.   Vocational Rehab Evaluation & Intervention:   Education: Education Goals: Education classes will be provided on a variety of topics geared toward better understanding of heart and vascular health and risk factor modification. Participant will state understanding/return demonstration of topics presented as noted by education test scores.  Learning Barriers/Preferences:  Learning Barriers/Preferences - 12/01/23 0936       Learning Barriers/Preferences   Learning Barriers Sight    Learning Preferences None             Education Topics: General Nutrition Guidelines/Fats and Fiber: -Group instruction provided by verbal, written material, models and posters to present the general guidelines for heart healthy nutrition. Gives an explanation and review of dietary fats and fiber. Flowsheet Row Cardiac Rehab from 06/12/2022 in [facility] Cardiac and Pulmonary Rehab  Education need identified 03/04/22       Controlling Sodium/Reading Food Labels: -Group verbal and written material supporting the discussion of sodium use in heart healthy nutrition. Review and explanation with  models, verbal and written materials for utilization of the food label.   Exercise Physiology & Risk Factors: - Group verbal and written instruction with models to review the exercise physiology of the cardiovascular system and associated critical values. Details cardiovascular disease risk factors and the goals associated with each risk factor. Flowsheet Row Cardiac Rehab from 06/12/2022 in [facility] Cardiac and Pulmonary Rehab  Education need identified 03/04/22       Aerobic Exercise & Resistance Training: - Gives group verbal and written discussion on the health impact of inactivity. On the components of aerobic and resistive training programs and the benefits of this training and how to safely progress through these programs.   Flexibility, Balance, General Exercise Guidelines: - Provides group verbal and written instruction on the benefits of flexibility and balance training programs. Provides general exercise guidelines with specific guidelines to those with heart or lung disease. Demonstration and skill practice provided. Flowsheet Row Cardiac Rehab from 06/12/2022 in [facility] Cardiac and Pulmonary Rehab  Date 06/12/22  Educator SB  Instruction Review Code 1- Verbalizes Understanding       Stress Management: - Provides group verbal and written instruction about the health risks of elevated stress, cause of high stress, and healthy ways to reduce stress.   Depression: - Provides group  verbal and written instruction on the correlation between heart/lung disease and depressed mood, treatment options, and the stigmas associated with seeking treatment.   Anatomy & Physiology of the Heart: - Group verbal and written instruction and models provide basic cardiac anatomy and physiology, with the coronary electrical and arterial systems. Review of: AMI, Angina, Valve disease, Heart Failure, Cardiac Arrhythmia, Pacemakers, and the ICD. Flowsheet Row Cardiac Rehab from 06/12/2022 in [facility] Cardiac  and Pulmonary Rehab  Education need identified 03/04/22       Cardiac Procedures: - Group verbal and written instruction to review commonly prescribed medications for heart disease. Reviews the medication, class of the drug, and side effects. Includes the steps to properly store meds and maintain the prescription regimen. (beta blockers and nitrates)   Cardiac Medications I: - Group verbal and written instruction to review commonly prescribed medications for heart disease. Reviews the medication, class of the drug, and side effects. Includes the steps to properly store meds and maintain the prescription regimen.   Cardiac Medications II: -Group verbal and written instruction to review commonly prescribed medications for heart disease. Reviews the medication, class of the drug, and side effects. (all other drug classes)    Go Sex-Intimacy & Heart Disease, Get SMART - Goal Setting: - Group verbal and written instruction through game format to discuss heart disease and the return to sexual intimacy. Provides group verbal and written material to discuss and apply goal setting through the application of the S.M.A.R.T. Method.   Other Matters of the Heart: - Provides group verbal, written materials and models to describe Heart Failure, Angina, Valve Disease, Peripheral Artery Disease, and Diabetes in the realm of heart disease. Includes description of the disease process and treatment options available to the cardiac patient.   Exercise & Equipment Safety: - Individual verbal instruction and demonstration of equipment use and safety with use of the equipment. Flowsheet Row Cardiac Rehab from 12/01/2023 in [facility] Cardiac and Pulmonary Rehab  Date 12/01/23  Educator [facility]  Instruction Review Code 1- Verbalizes Understanding       Infection Prevention: - Provides verbal and written material to individual with discussion of infection control including proper hand washing and proper equipment  cleaning during exercise session. Flowsheet Row Cardiac Rehab from 12/01/2023 in [facility] Cardiac and Pulmonary Rehab  Date 12/01/23  Educator [facility]  Instruction Review Code 1- Verbalizes Understanding       Falls Prevention: - Provides verbal and written material to individual with discussion of falls prevention and safety. Flowsheet Row Cardiac Rehab from 12/01/2023 in [facility] Cardiac and Pulmonary Rehab  Date 12/01/23  Educator [facility]  Instruction Review Code 1- Verbalizes Understanding       Diabetes: - Individual verbal and written instruction to review signs/symptoms of diabetes, desired ranges of glucose level fasting, after meals and with exercise. Acknowledge that pre and post exercise glucose checks will be done for 3 sessions at entry of program. Flowsheet Row Cardiac Rehab from 12/01/2023 in [facility] Cardiac and Pulmonary Rehab  Date 12/01/23  Educator [facility]  Instruction Review Code 1- Verbalizes Understanding       Other: -Provides group and verbal instruction on various topics (see comments)    Knowledge Questionnaire Score:   Core Components/Risk Factors/Patient Goals at Admission:  Personal Goals and Risk Factors at Admission - 12/01/23 1558       Core Components/Risk Factors/Patient Goals on Admission    Weight Management Yes    Intervention Weight Management: Develop a combined nutrition and exercise program  designed to reach desired caloric intake, while maintaining appropriate intake of nutrient and fiber, sodium and fats, and appropriate energy expenditure required for the weight goal.;Weight Management: Provide education and appropriate resources to help participant work on and attain dietary goals.;Weight Management/Obesity: Establish reasonable short term and long term weight goals.    Admit Weight 173 lb 8 oz (78.7 kg)    Goal Weight: Short Term 173 lb 8 oz (78.7 kg)    Goal Weight: Long Term 173 lb 8 oz (78.7 kg)    Expected Outcomes Short Term: Continue to assess and  modify interventions until short term weight is achieved;Weight Maintenance: Understanding of the daily nutrition guidelines, which includes 25-35% calories from fat, 7% or less cal from saturated fats, less than 200mg  cholesterol, less than 1.5gm of sodium, & 5 or more servings of fruits and vegetables daily;Understanding recommendations for meals to include 15-35% energy as protein, 25-35% energy from fat, 35-60% energy from carbohydrates, less than 200mg  of dietary cholesterol, 20-35 gm of total fiber daily;Understanding of distribution of calorie intake throughout the day with the consumption of 4-5 meals/snacks    Tobacco Cessation Yes    Intervention Assist the participant in steps to quit. Provide individualized education and counseling about committing to Tobacco Cessation, relapse prevention, and pharmacological support that can be provided by physician.;Education officer, environmental, assist with locating and accessing local/national Quit Smoking programs, and support quit date choice.    Expected Outcomes Short Term: Will demonstrate readiness to quit, by selecting a quit date.;Short Term: Will quit all tobacco product use, adhering to prevention of relapse plan.;Long Term: Complete abstinence from all tobacco products for at least 12 months from quit date.    Diabetes Yes    Intervention Provide education about signs/symptoms and action to take for hypo/hyperglycemia.;Provide education about proper nutrition, including hydration, and aerobic/resistive exercise prescription along with prescribed medications to achieve blood glucose in normal ranges: Fasting glucose 65-99 mg/dL    Expected Outcomes Short Term: Participant verbalizes understanding of the signs/symptoms and immediate care of hyper/hypoglycemia, proper foot care and importance of medication, aerobic/resistive exercise and nutrition plan for blood glucose control.;Long Term: Attainment of HbA1C < 7%.    Hypertension Yes    Intervention  Provide education on lifestyle modifcations including regular physical activity/exercise, weight management, moderate sodium restriction and increased consumption of fresh fruit, vegetables, and low fat dairy, alcohol moderation, and smoking cessation.;Monitor prescription use compliance.    Expected Outcomes Short Term: Continued assessment and intervention until BP is < 140/80mm HG in hypertensive participants. < 130/21mm HG in hypertensive participants with diabetes, heart failure or chronic kidney disease.;Long Term: Maintenance of blood pressure at goal levels.    Lipids Yes    Intervention Provide education and support for participant on nutrition & aerobic/resistive exercise along with prescribed medications to achieve LDL 70mg , HDL >40mg .    Expected Outcomes Short Term: Participant states understanding of desired cholesterol values and is compliant with medications prescribed. Participant is following exercise prescription and nutrition guidelines.;Long Term: Cholesterol controlled with medications as prescribed, with individualized exercise RX and with personalized nutrition plan. Value goals: LDL < 70mg , HDL > 40 mg.             Core Components/Risk Factors/Patient Goals Review:   Goals and Risk Factor Review     Row Name 12/29/23 1751             Core Components/Risk Factors/Patient Goals Review   Personal Goals Review Tobacco Cessation  Review Daniel Fuentes is doing well in the PAD program and is improving his stamina. He wants to try to quit smoking but is up to 4 cigarettes a day. He states he has the material to help him quit and is going to try to cut back.       Expected Outcomes Short: decrease tobacco use. Long: quit smoking.                Core Components/Risk Factors/Patient Goals at Discharge (Final Review):   Goals and Risk Factor Review - 12/29/23 1751       Core Components/Risk Factors/Patient Goals Review   Personal Goals Review Tobacco Cessation    Review  Daniel Fuentes is doing well in the PAD program and is improving his stamina. He wants to try to quit smoking but is up to 4 cigarettes a day. He states he has the material to help him quit and is going to try to cut back.    Expected Outcomes Short: decrease tobacco use. Long: quit smoking.             ITP Comments:  ITP Comments     Row Name 12/01/23 0981 12/01/23 1541 12/04/23 1750 12/10/23 0850 01/07/24 1129   ITP Comments Virtual Visit completed. Patient informed on EP and RD appointment and 6 Minute walk test. Patient also informed of patient health questionnaires on My Chart. Patient Verbalizes understanding. Visit diagnosis can be found in [facility] 11/14/2023. Completed and gym orientation. Initial ITP created and sent for review to Dr. Bethann Punches, Medical Director. Daniel Fuentes is a current tobacco user. Intervention for tobacco cessation was provided at the initial medical review.He was asked about readiness to quit and reported he doesn't smoke that much, just occationally. Patient was advised and educated about tobacco cessation using combination therapy, tobacco cessation classes, quit line, and quit smoking apps. Patient demonstrated understanding of this material. Staff will continue to provide encouragement and follow up with the patient throughout the program. First full day of exercise!  Patient was oriented to gym and equipment including functions, settings, policies, and procedures.  Patient's individual exercise prescription and treatment plan were reviewed.  All starting workloads were established based on the results of the 6 minute walk test done at initial orientation visit.  The plan for exercise progression was also introduced and progression will be customized based on patient's performance and goals. 30 Day review completed. Medical Director ITP review done, changes made as directed, and signed approval by Medical Director. New to program. 30 Day review completed. Medical Director ITP review  done, changes made as directed, and signed approval by Medical Director.            Comments:

## 2024-01-08 ENCOUNTER — Encounter

## 2024-01-08 DIAGNOSIS — Z48812 Encounter for surgical aftercare following surgery on the circulatory system: Secondary | ICD-10-CM | POA: Diagnosis not present

## 2024-01-08 DIAGNOSIS — I739 Peripheral vascular disease, unspecified: Secondary | ICD-10-CM

## 2024-01-08 NOTE — Progress Notes (Signed)
 Daily Session Note  Patient Details  Name: Daniel Fuentes MRN: [id number] Date of Birth: Jul 15, 1951 Referring Provider:   Flowsheet Row Cardiac Rehab from 12/01/2023 in [facility] Cardiac and Pulmonary Rehab  Referring Provider Dr. Lorine Bears       Encounter Date: 01/08/2024  Check In:  Session Check In - 01/08/24 1722       Check-In   Supervising physician immediately available to respond to emergencies See telemetry face sheet for immediately available ER MD    Physician(s) Dr. Iris Pert    Location [facility]-Cardiac & Pulmonary Rehab    Staff Present Susann Givens RN,BSN;[facility] BS, Exercise Physiologist    Virtual Visit No    Medication changes reported     No    Fall or balance concerns reported    No    Tobacco Cessation No Change    Current number of cigarettes/nicotine per day     4    Warm-up and Cool-down Performed on first and last piece of equipment    Resistance Training Performed Yes    VAD Patient? No    PAD/SET Patient? Yes      PAD/SET Patient   Completed foot check today? Yes    Open wounds to report? No      Pain Assessment   Currently in Pain? No/denies                Social History   Tobacco Use  Smoking Status Every Day   Current packs/day: 0.00   Average packs/day: 0.2 packs/day for 55.4 years (11.1 ttl pk-yrs)   Types: Pipe, Cigarettes   Start date: 09/04/1966   Last attempt to quit: 02/08/2022   Years since quitting: 1.9  Smokeless Tobacco Never  Tobacco Comments   Smokes 7 cigarettes in a week.     Goals Met:  Independence with exercise equipment Exercise tolerated well No report of concerns or symptoms today Strength training completed today  Goals Unmet:  Not Applicable  Comments: Pt able to follow exercise prescription today without complaint.  Will continue to monitor for progression.    Dr. Bethann Punches is Medical Director for [facility] Cardiac Rehabilitation.  Dr. Vida Rigger is  Medical Director for [facility] Pulmonary Rehabilitation.

## 2024-01-12 ENCOUNTER — Encounter: Admitting: *Deleted

## 2024-01-12 DIAGNOSIS — I214 Non-ST elevation (NSTEMI) myocardial infarction: Secondary | ICD-10-CM

## 2024-01-12 DIAGNOSIS — I739 Peripheral vascular disease, unspecified: Secondary | ICD-10-CM

## 2024-01-12 DIAGNOSIS — Z955 Presence of coronary angioplasty implant and graft: Secondary | ICD-10-CM

## 2024-01-12 DIAGNOSIS — Z48812 Encounter for surgical aftercare following surgery on the circulatory system: Secondary | ICD-10-CM | POA: Diagnosis not present

## 2024-01-12 NOTE — Progress Notes (Signed)
 Daily Session Note  Patient Details  Name: Daniel Fuentes MRN: [id number] Date of Birth: 1951/08/25 Referring Provider:   Flowsheet Row Cardiac Rehab from 12/01/2023 in [facility] Cardiac and Pulmonary Rehab  Referring Provider Dr. Antionette Kirks       Encounter Date: 01/12/2024  Check In:      Social History   Tobacco Use  Smoking Status Every Day   Current packs/day: 0.00   Average packs/day: 0.2 packs/day for 55.4 years (11.1 ttl pk-yrs)   Types: Pipe, Cigarettes   Start date: 09/04/1966   Last attempt to quit: 02/08/2022   Years since quitting: 1.9  Smokeless Tobacco Never  Tobacco Comments   Smokes 7 cigarettes in a week.     Goals Met:  Independence with exercise equipment Exercise tolerated well No report of concerns or symptoms today  Goals Unmet:  Not Applicable  Comments: Pt able to follow exercise prescription today without complaint.  Will continue to monitor for progression.    Dr. Firman Hughes is Medical Director for [facility] Cardiac Rehabilitation.  Dr. Fuad Aleskerov is Medical Director for [facility] Pulmonary Rehabilitation.

## 2024-01-14 ENCOUNTER — Encounter: Payer: Self-pay | Admitting: *Deleted

## 2024-01-14 ENCOUNTER — Encounter: Admitting: *Deleted

## 2024-01-14 DIAGNOSIS — Z48812 Encounter for surgical aftercare following surgery on the circulatory system: Secondary | ICD-10-CM | POA: Diagnosis not present

## 2024-01-14 DIAGNOSIS — I739 Peripheral vascular disease, unspecified: Secondary | ICD-10-CM

## 2024-01-14 NOTE — Progress Notes (Signed)
 Daily Session Note  Patient Details  Name: Daniel Fuentes MRN: [id number] Date of Birth: 1951/09/03 Referring Provider:   Flowsheet Row Cardiac Rehab from 12/01/2023 in [facility] Cardiac and Pulmonary Rehab  Referring Provider Dr. Antionette Kirks       Encounter Date: 01/14/2024  Check In:  Session Check In - 01/14/24 1733       Check-In   Supervising physician immediately available to respond to emergencies See telemetry face sheet for immediately available ER MD    Physician(s) Dr. Peggi Bowels and Felipe Horton    Location [facility]-Cardiac & Pulmonary Rehab    Staff Present Sue Em RN,BSN;Susanne Bice, RN, BSN, CCRP;Kelly Hayes BS, ACSM CEP, Exercise Physiologist    Virtual Visit No    Medication changes reported     No    Fall or balance concerns reported    No    Tobacco Cessation No Change    Current number of cigarettes/nicotine per day     4    Warm-up and Cool-down Performed on first and last piece of equipment    Resistance Training Performed Yes    VAD Patient? No    PAD/SET Patient? Yes      PAD/SET Patient   Completed foot check today? Yes    Open wounds to report? No      Pain Assessment   Currently in Pain? No/denies                Social History   Tobacco Use  Smoking Status Every Day   Current packs/day: 0.00   Average packs/day: 0.2 packs/day for 55.4 years (11.1 ttl pk-yrs)   Types: Pipe, Cigarettes   Start date: 09/04/1966   Last attempt to quit: 02/08/2022   Years since quitting: 1.9  Smokeless Tobacco Never  Tobacco Comments   Smokes 7 cigarettes in a week.     Goals Met:  Independence with exercise equipment Exercise tolerated well No report of concerns or symptoms today Strength training completed today  Goals Unmet:  Not Applicable  Comments: Pt able to follow exercise prescription today without complaint.  Will continue to monitor for progression.    Dr. Firman Hughes is Medical Director for [facility] Cardiac Rehabilitation.  Dr. Fuad  Aleskerov is Medical Director for [facility] Pulmonary Rehabilitation.

## 2024-01-15 ENCOUNTER — Encounter: Admitting: *Deleted

## 2024-01-15 DIAGNOSIS — I739 Peripheral vascular disease, unspecified: Secondary | ICD-10-CM

## 2024-01-15 DIAGNOSIS — Z48812 Encounter for surgical aftercare following surgery on the circulatory system: Secondary | ICD-10-CM | POA: Diagnosis not present

## 2024-01-15 NOTE — Progress Notes (Signed)
 Daily Session Note  Patient Details  Name: Daniel Fuentes MRN: [id number] Date of Birth: 28-Nov-1950 Referring Provider:   Flowsheet Row Cardiac Rehab from 12/01/2023 in [facility] Cardiac and Pulmonary Rehab  Referring Provider Dr. Antionette Kirks       Encounter Date: 01/15/2024  Check In:  Session Check In - 01/15/24 1728       Check-In   Supervising physician immediately available to respond to emergencies PAD/SET Supervising Physician    Physician(s) Dr. Karlynn Oyster and [facility]    Location [facility]-Cardiac & Pulmonary Rehab    Staff Present Sue Em RN,BSN;Maxon Conetta BS, Exercise Physiologist;Laureen Bevin Bucks, BS, RRT, CPFT    Virtual Visit No    Medication changes reported     No    Fall or balance concerns reported    No    Tobacco Cessation No Change    Current number of cigarettes/nicotine per day     4    Warm-up and Cool-down Performed on first and last piece of equipment    Resistance Training Performed Yes    VAD Patient? No    PAD/SET Patient? Yes      PAD/SET Patient   Completed foot check today? Yes    Open wounds to report? No      Pain Assessment   Currently in Pain? No/denies                Social History   Tobacco Use  Smoking Status Every Day   Current packs/day: 0.00   Average packs/day: 0.2 packs/day for 55.4 years (11.1 ttl pk-yrs)   Types: Pipe, Cigarettes   Start date: 09/04/1966   Last attempt to quit: 02/08/2022   Years since quitting: 1.9  Smokeless Tobacco Never  Tobacco Comments   Smokes 7 cigarettes in a week.     Goals Met:  Independence with exercise equipment Exercise tolerated well No report of concerns or symptoms today Strength training completed today  Goals Unmet:  Not Applicable  Comments: Pt able to follow exercise prescription today without complaint.  Will continue to monitor for progression.    Dr. Firman Hughes is Medical Director for [facility] Cardiac Rehabilitation.  Dr. Fuad Aleskerov is Medical Director for  [facility] Pulmonary Rehabilitation.

## 2024-01-19 ENCOUNTER — Encounter: Admitting: *Deleted

## 2024-01-19 DIAGNOSIS — I739 Peripheral vascular disease, unspecified: Secondary | ICD-10-CM

## 2024-01-19 DIAGNOSIS — Z48812 Encounter for surgical aftercare following surgery on the circulatory system: Secondary | ICD-10-CM | POA: Diagnosis not present

## 2024-01-19 NOTE — Progress Notes (Signed)
 Daily Session Note  Patient Details  Name: Daniel Fuentes MRN: [id number] Date of Birth: [birth year] Referring Provider:   Flowsheet Row Cardiac Rehab from 12/01/2023 in [facility] Cardiac and Pulmonary Rehab  Referring Provider Dr. Antionette Kirks       Encounter Date: 01/19/2024  Check In:  Session Check In - 01/19/24 1722       Check-In   Supervising physician immediately available to respond to emergencies PAD/SET Supervising Physician    Physician(s) Dr. Cam Cava and Drenda Gentle    Location [facility]-Cardiac & Pulmonary Rehab    Staff Present Sue Em RN,BSN;Kelly Sabra Cramp BS, ACSM CEP, Exercise Physiologist;Susanne Bice, RN, BSN, CCRP;Joseph Hood RCP,RRT,BSRT    Virtual Visit No    Medication changes reported     No    Fall or balance concerns reported    No    Tobacco Cessation No Change    Warm-up and Cool-down Performed on first and last piece of equipment    Resistance Training Performed Yes    VAD Patient? No    PAD/SET Patient? Yes      PAD/SET Patient   Completed foot check today? Yes    Open wounds to report? No      Pain Assessment   Currently in Pain? No/denies                Social History   Tobacco Use  Smoking Status Every Day   Current packs/day: 0.00   Average packs/day: 0.2 packs/day for 55.4 years (11.1 ttl pk-yrs)   Types: Pipe, Cigarettes   Start date: 09/04/1966   Last attempt to quit: 02/08/2022   Years since quitting: 1.9  Smokeless Tobacco Never  Tobacco Comments   Smokes 7 cigarettes in a week.     Goals Met:  Independence with exercise equipment Exercise tolerated well No report of concerns or symptoms today Strength training completed today  Goals Unmet:  Not Applicable  Comments: Pt able to follow exercise prescription today without complaint.  Will continue to monitor for progression.    Dr. Firman Hughes is Medical Director for [facility] Cardiac Rehabilitation.  Dr. Fuad Aleskerov is Medical Director for [facility] Pulmonary  Rehabilitation.

## 2024-01-21 ENCOUNTER — Encounter: Admitting: *Deleted

## 2024-01-21 DIAGNOSIS — I739 Peripheral vascular disease, unspecified: Secondary | ICD-10-CM

## 2024-01-21 DIAGNOSIS — Z48812 Encounter for surgical aftercare following surgery on the circulatory system: Secondary | ICD-10-CM | POA: Diagnosis not present

## 2024-01-21 NOTE — Progress Notes (Signed)
 Daily Session Note  Patient Details  Name: Daniel Fuentes MRN: [id number] Date of Birth: 18-Dec-1950 Referring Provider:   Flowsheet Row Cardiac Rehab from 12/01/2023 in [facility] Cardiac and Pulmonary Rehab  Referring Provider Dr. Antionette Kirks       Encounter Date: 01/21/2024  Check In:  Session Check In - 01/21/24 1725       Check-In   Supervising physician immediately available to respond to emergencies PAD/SET Supervising Physician    Physician(s) Dr. Karlynn Oyster and [facility]    Location [facility]-Cardiac & Pulmonary Rehab    Staff Present Sue Em RN,BSN;Susanne Bice, RN, BSN, CCRP;[facility] BS, ACSM CEP, Exercise Physiologist    Virtual Visit No    Medication changes reported     No    Fall or balance concerns reported    No    Warm-up and Cool-down Performed on first and last piece of equipment    Resistance Training Performed Yes    VAD Patient? No    PAD/SET Patient? Yes      PAD/SET Patient   Completed foot check today? Yes    Open wounds to report? No      Pain Assessment   Currently in Pain? No/denies                Social History   Tobacco Use  Smoking Status Every Day   Current packs/day: 0.00   Average packs/day: 0.2 packs/day for 55.4 years (11.1 ttl pk-yrs)   Types: Pipe, Cigarettes   Start date: 09/04/1966   Last attempt to quit: 02/08/2022   Years since quitting: 1.9  Smokeless Tobacco Never  Tobacco Comments   Smokes 7 cigarettes in a week.     Goals Met:  Independence with exercise equipment Exercise tolerated well Personal goals reviewed No report of concerns or symptoms today Strength training completed today  Goals Unmet:  Not Applicable  Comments: Pt able to follow exercise prescription today without complaint.  Will continue to monitor for progression.   Reviewed home exercise with pt today.  Pt plans to walking and senior center[facility]sports plex for exercise.  Reviewed THR, pulse, RPE, sign and symptoms, pulse  oximetery and when to call 911 or MD.  Also discussed weather considerations and indoor options.  Pt voiced understanding.  Dr. Firman Hughes is Medical Director for [facility] Cardiac Rehabilitation.  Dr. Fuad Aleskerov is Medical Director for [facility] Pulmonary Rehabilitation.

## 2024-01-22 ENCOUNTER — Encounter: Admitting: *Deleted

## 2024-01-22 DIAGNOSIS — I739 Peripheral vascular disease, unspecified: Secondary | ICD-10-CM

## 2024-01-22 DIAGNOSIS — Z48812 Encounter for surgical aftercare following surgery on the circulatory system: Secondary | ICD-10-CM | POA: Diagnosis not present

## 2024-01-22 NOTE — Progress Notes (Signed)
 Daily Session Note  Patient Details  Name: Daniel Fuentes MRN: [id number] Date of Birth: 03/21/1951 Referring Provider:   Flowsheet Row Cardiac Rehab from 12/01/2023 in [facility] Cardiac and Pulmonary Rehab  Referring Provider Dr. Antionette Kirks       Encounter Date: 01/22/2024  Check In:  Session Check In - 01/22/24 1730       Check-In   Supervising physician immediately available to respond to emergencies PAD/SET Supervising Physician    Physician(s) Dr. Azalee Bolds and Karlynn Oyster    Location [facility]-Cardiac & Pulmonary Rehab    Staff Present Sue Em RN,BSN;Maxon Conetta BS, Exercise Physiologist;Joseph Lacinda Pica RCP,RRT,BSRT    Virtual Visit No    Medication changes reported     No    Fall or balance concerns reported    No    Tobacco Cessation No Change    Current number of cigarettes/nicotine per day     4    Warm-up and Cool-down Performed on first and last piece of equipment    Resistance Training Performed Yes    VAD Patient? No    PAD/SET Patient? Yes      PAD/SET Patient   Completed foot check today? Yes    Open wounds to report? No      Pain Assessment   Currently in Pain? No/denies                Social History   Tobacco Use  Smoking Status Every Day   Current packs/day: 0.00   Average packs/day: 0.2 packs/day for 55.4 years (11.1 ttl pk-yrs)   Types: Pipe, Cigarettes   Start date: 09/04/1966   Last attempt to quit: 02/08/2022   Years since quitting: 1.9  Smokeless Tobacco Never  Tobacco Comments   Smokes 7 cigarettes in a week.     Goals Met:  Independence with exercise equipment Exercise tolerated well No report of concerns or symptoms today Strength training completed today  Goals Unmet:  Not Applicable  Comments: Pt able to follow exercise prescription today without complaint.  Will continue to monitor for progression.    Dr. Firman Hughes is Medical Director for [facility] Cardiac Rehabilitation.  Dr. Fuad Aleskerov is Medical Director for  [facility] Pulmonary Rehabilitation.

## 2024-01-26 ENCOUNTER — Encounter: Admitting: *Deleted

## 2024-01-26 DIAGNOSIS — Z48812 Encounter for surgical aftercare following surgery on the circulatory system: Secondary | ICD-10-CM | POA: Diagnosis not present

## 2024-01-26 DIAGNOSIS — I739 Peripheral vascular disease, unspecified: Secondary | ICD-10-CM

## 2024-01-26 NOTE — Progress Notes (Signed)
 Daily Session Note  Patient Details  Name: Tristin Goldberg MRN: [id number] Date of Birth: 16-Mar-1951 Referring Provider:   Flowsheet Row Cardiac Rehab from 12/01/2023 in [facility] Cardiac and Pulmonary Rehab  Referring Provider Dr. Antionette Kirks       Encounter Date: 01/26/2024  Check In:  Session Check In - 01/26/24 1717       Check-In   Supervising physician immediately available to respond to emergencies PAD/SET Supervising Physician    Physician(s) Dr. Peggi Bowels and [facility]    Location [facility]-Cardiac & Pulmonary Rehab    Staff Present Sue Em RN,BSN;Joseph [facility] BS, ACSM CEP, Exercise Physiologist    Virtual Visit No    Medication changes reported     No    Fall or balance concerns reported    No    Tobacco Cessation No Change    Current number of cigarettes/nicotine per day     4    Warm-up and Cool-down Performed on first and last piece of equipment    Resistance Training Performed Yes    VAD Patient? No    PAD/SET Patient? Yes      PAD/SET Patient   Completed foot check today? Yes    Open wounds to report? No      Pain Assessment   Currently in Pain? No/denies                Social History   Tobacco Use  Smoking Status Every Day   Current packs/day: 0.00   Average packs/day: 0.2 packs/day for 55.4 years (11.1 ttl pk-yrs)   Types: Pipe, Cigarettes   Start date: 09/04/1966   Last attempt to quit: 02/08/2022   Years since quitting: 1.9  Smokeless Tobacco Never  Tobacco Comments   Smokes 7 cigarettes in a week.     Goals Met:  Independence with exercise equipment Exercise tolerated well No report of concerns or symptoms today Strength training completed today  Goals Unmet:  Not Applicable  Comments: Pt able to follow exercise prescription today without complaint.  Will continue to monitor for progression.    Dr. Firman Hughes is Medical Director for [facility] Cardiac Rehabilitation.  Dr. Fuad Aleskerov is Medical Director for  [facility] Pulmonary Rehabilitation.

## 2024-01-28 ENCOUNTER — Encounter

## 2024-01-29 ENCOUNTER — Encounter: Attending: Internal Medicine | Admitting: *Deleted

## 2024-01-29 DIAGNOSIS — I70213 Atherosclerosis of native arteries of extremities with intermittent claudication, bilateral legs: Secondary | ICD-10-CM | POA: Diagnosis present

## 2024-01-29 DIAGNOSIS — I739 Peripheral vascular disease, unspecified: Secondary | ICD-10-CM

## 2024-01-29 NOTE — Progress Notes (Signed)
 Daily Session Note  Patient Details  Name: Daniel Fuentes MRN: [id number] Date of Birth: 12-02-1950 Referring Provider:   Flowsheet Row Cardiac Rehab from 12/01/2023 in [facility] Cardiac and Pulmonary Rehab  Referring Provider Dr. Antionette Kirks       Encounter Date: 01/29/2024  Check In:  Session Check In - 01/29/24 1700       Check-In   Supervising physician immediately available to respond to emergencies PAD/SET Supervising Physician    Physician(s) Dr. August Leavens and Hendrick Locke    Location [facility]-Cardiac & Pulmonary Rehab    Staff Present Sue Em RN,BSN;Joseph [facility] BS, Exercise Physiologist    Virtual Visit No    Medication changes reported     No    Warm-up and Cool-down Performed on first and last piece of equipment    Resistance Training Performed Yes    VAD Patient? No    PAD/SET Patient? Yes      PAD/SET Patient   Completed foot check today? Yes    Open wounds to report? No      Pain Assessment   Currently in Pain? No/denies                Social History   Tobacco Use  Smoking Status Every Day   Current packs/day: 0.00   Average packs/day: 0.2 packs/day for 55.4 years (11.1 ttl pk-yrs)   Types: Pipe, Cigarettes   Start date: 09/04/1966   Last attempt to quit: 02/08/2022   Years since quitting: 1.9  Smokeless Tobacco Never  Tobacco Comments   Smokes 7 cigarettes in a week.     Goals Met:  Independence with exercise equipment Exercise tolerated well No report of concerns or symptoms today Strength training completed today  Goals Unmet:  Not Applicable  Comments: Pt able to follow exercise prescription today without complaint.  Will continue to monitor for progression.    Dr. Firman Hughes is Medical Director for [facility] Cardiac Rehabilitation.  Dr. Fuad Aleskerov is Medical Director for [facility] Pulmonary Rehabilitation.

## 2024-02-02 ENCOUNTER — Encounter: Admitting: *Deleted

## 2024-02-02 DIAGNOSIS — I70213 Atherosclerosis of native arteries of extremities with intermittent claudication, bilateral legs: Secondary | ICD-10-CM | POA: Diagnosis not present

## 2024-02-02 DIAGNOSIS — I739 Peripheral vascular disease, unspecified: Secondary | ICD-10-CM

## 2024-02-02 NOTE — Progress Notes (Signed)
 Daily Session Note  Patient Details  Name: Daniel Fuentes MRN: [id number] Date of Birth: Oct 31, 1950 Referring Provider:   Flowsheet Row Cardiac Rehab from 12/01/2023 in [facility] Cardiac and Pulmonary Rehab  Referring Provider Dr. Antionette Kirks       Encounter Date: 02/02/2024  Check In:  Session Check In - 02/02/24 1748       Check-In   Supervising physician immediately available to respond to emergencies See telemetry face sheet for immediately available ER MD    Location [facility]-Cardiac & Pulmonary Rehab    Staff Present Maud Sorenson, RN, BSN, CCRP;Joseph Hood RCP,RRT,BSRT;Kelly Kemp BS, ACSM CEP, Exercise Physiologist    Virtual Visit No    Medication changes reported     No    Fall or balance concerns reported    No    Tobacco Cessation Use Increase    Warm-up and Cool-down Performed on first and last piece of equipment    Resistance Training Performed Yes    VAD Patient? No    PAD/SET Patient? Yes      PAD/SET Patient   Completed foot check today? Yes    Open wounds to report? Yes      Pain Assessment   Currently in Pain? No/denies                Social History   Tobacco Use  Smoking Status Every Day   Current packs/day: 0.00   Average packs/day: 0.2 packs/day for 55.4 years (11.1 ttl pk-yrs)   Types: Pipe, Cigarettes   Start date: 09/04/1966   Last attempt to quit: 02/08/2022   Years since quitting: 1.9  Smokeless Tobacco Never  Tobacco Comments   Smokes 7 cigarettes in a week.     Goals Met:  Independence with exercise equipment Changing diet to healthy choices, watching portion sizes No report of concerns or symptoms today  Goals Unmet:  Not Applicable  Comments: Pt able to follow exercise prescription today without complaint.  Will continue to monitor for progression.    Dr. Firman Hughes is Medical Director for [facility] Cardiac Rehabilitation.  Dr. Fuad Aleskerov is Medical Director for [facility] Pulmonary Rehabilitation.

## 2024-02-04 ENCOUNTER — Encounter: Admitting: *Deleted

## 2024-02-04 DIAGNOSIS — I739 Peripheral vascular disease, unspecified: Secondary | ICD-10-CM

## 2024-02-04 DIAGNOSIS — I70213 Atherosclerosis of native arteries of extremities with intermittent claudication, bilateral legs: Secondary | ICD-10-CM | POA: Diagnosis not present

## 2024-02-04 NOTE — Progress Notes (Signed)
 Daily Session Note  Patient Details  Name: Daniel Fuentes MRN: [id number] Date of Birth: 04/02/1951 Referring Provider:   Flowsheet Row Cardiac Rehab from 12/01/2023 in [facility] Cardiac and Pulmonary Rehab  Referring Provider Dr. Antionette Kirks       Encounter Date: 02/04/2024  Check In:  Session Check In - 02/04/24 1727       Check-In   Supervising physician immediately available to respond to emergencies PAD/SET Supervising Physician    Physician(s) Dr. Felipe Horton and Karlynn Oyster    Location [facility]-Cardiac & Pulmonary Rehab    Staff Present Sue Em RN,BSN;Joseph [facility] BS, ACSM CEP, Exercise Physiologist    Virtual Visit No    Medication changes reported     No    Fall or balance concerns reported    No    Tobacco Cessation No Change    Current number of cigarettes/nicotine per day     4    Warm-up and Cool-down Performed on first and last piece of equipment    Resistance Training Performed Yes    VAD Patient? No    PAD/SET Patient? Yes      PAD/SET Patient   Completed foot check today? Yes    Open wounds to report? No      Pain Assessment   Currently in Pain? No/denies                Social History   Tobacco Use  Smoking Status Every Day   Current packs/day: 0.00   Average packs/day: 0.2 packs/day for 55.4 years (11.1 ttl pk-yrs)   Types: Pipe, Cigarettes   Start date: 09/04/1966   Last attempt to quit: 02/08/2022   Years since quitting: 1.9  Smokeless Tobacco Never  Tobacco Comments   Smokes 7 cigarettes in a week.     Goals Met:  Independence with exercise equipment  Goals Unmet:  BP- elevated and had to reduce treadmill incline and speed   Comments: Pt able to follow exercise prescription today without complaint.  Will continue to monitor for progression.    Dr. Firman Hughes is Medical Director for [facility] Cardiac Rehabilitation.  Dr. Fuad Aleskerov is Medical Director for [facility] Pulmonary Rehabilitation.

## 2024-02-04 NOTE — Progress Notes (Signed)
 PAD/SET Plan of Care Updates  Patient Details  Name: Daniel Fuentes MRN: [id number] Date of Birth: 1951-02-27  Entry Date:  Flowsheet Row Cardiac Rehab from 12/01/2023 in [facility] Cardiac and Pulmonary Rehab  Date 12/01/23      Expected Discharge Date:   Medical Diagnosis: PAD (peripheral artery disease) (HCC)  Encounter Date: 02/04/24  Exercise Target Goals: Exercise Program Goal: Individual exercise prescription set with THRR, safety & activity barriers. Participant demonstrates ability to understand and report RPE using RPE 6-20 scale, to self-measure pulse accurately, and to acknowledge the importance of the exercise prescription.  Exercise Prescription Goal: Use initial exercise assessment to set exercise prescription to improve time and distance to onset of claudication pain. Provide education to aid in steps toward risk factor modification, improve quality of life with claudication, and utilize THRR and exercise prescription for best results for decreasing symptoms of PAD. Prevent injury to bone, joints, and skin integrity during the exercise through checks of each system during session check in. Following physician guidelines for any contraindications to specific exercises.  Activity Barriers:  Activity Barriers & Cardiac Risk Stratification - 12/01/23 1550       Activity Barriers & Cardiac Risk Stratification   Activity Barriers Other (comment)    Comments AAA BP restrictions >140/90 unless MD provides other restrictions             Initial Exercise Prescription:  Initial Exercise Prescription - 12/01/23 1500       Date of Initial Exercise RX and Referring Provider   Date 12/01/23    Referring Provider Dr. Antionette Kirks      Oxygen   Maintain Oxygen Saturation 88% or higher      Treadmill   MPH 2    Grade 2    Minutes 15    METs 3.08      NuStep   Level 3    SPM 80    Minutes 15    METs 3      Biostep-RELP   Level 3    SPM 50    Minutes 15    METs 3       Prescription Details   Frequency (times per week) 3    Duration Progress to 30 minutes of continuous aerobic without signs/symptoms of physical distress      Intensity   THRR 40-80% of Max Heartrate 108-133    Ratings of Perceived Exertion 11-13    Perceived Dyspnea 0-4      Progression   Progression Continue to follow PAD protocol      Resistance Training   Training Prescription Yes    Weight 4    Reps 10-15             Perform Capillary Blood Glucose checks as needed.  Current Exercise Prescription:   Exercise Prescription Changes     Row Name 12/01/23 1500 12/11/23 1500 12/25/23 0700 01/08/24 0800 01/21/24 1700     Response to Exercise   Blood Pressure (Admit) 122/80 132/64 138/68 136/68 --   Blood Pressure (Exercise) 168/86 -- 158/80 164/82 --   Blood Pressure (Exit) 128/82 134/68 128/70 102/60 --   Heart Rate (Admit) 83 bpm 70 bpm 63 bpm 83 bpm --   Heart Rate (Exercise) 108 bpm 100 bpm 109 bpm 118 bpm --   Heart Rate (Exit) 74 bpm 93 bpm 70 bpm 92 bpm --   Oxygen Saturation (Admit) 98 % 95 % 98 % 97 % --   Oxygen Saturation (Exercise) 97 %  92 % 91 % 91 % --   Oxygen Saturation (Exit) 97 % 95 % 91 % 96 % --   Rating of Perceived Exertion (Exercise) 15 12 13 13  --   Perceived Dyspnea (Exercise) -- -- 0 0 --   Symptoms PAD pain 4/5 PAD pain 4/5 on cps PAD pain 4/5 on CPS PAD pain 4/5 on CPS --   Comments PAD walk test First day of exercise -- -- --   Duration -- Progress to 30 minutes of  aerobic without signs/symptoms of physical distress Progress to 30 minutes of  aerobic without signs/symptoms of physical distress Continue with 30 min of aerobic exercise without signs/symptoms of physical distress. Continue with 30 min of aerobic exercise without signs/symptoms of physical distress.   Intensity -- THRR unchanged THRR unchanged THRR unchanged THRR unchanged     Progression   Progression -- Continue to progress workloads to maintain intensity without  signs/symptoms of physical distress. Continue to progress workloads to maintain intensity without signs/symptoms of physical distress. Continue to progress workloads to maintain intensity without signs/symptoms of physical distress. Continue to progress workloads to maintain intensity without signs/symptoms of physical distress.   Average METs -- 3.08 3.92 5.02 5.02     Resistance Training   Training Prescription -- Yes Yes Yes Yes   Weight -- 4 lb 4 lb 8 lb 8 lb   Reps -- 10-15 10-15 10-15 10-15     Interval Training   Interval Training -- No No No No     Oxygen   Oxygen -- Continuous -- -- --     Treadmill   MPH -- 2 2.2 2.2 2.2   Grade -- 2 6 8 8    Minutes -- 15 15 15 15    METs -- 3.08 4.51 5.11 5.11     Home Exercise Plan   Plans to continue exercise at -- -- -- -- [employer] (comment)  [facility]   Frequency -- -- -- -- Add 2 additional days to program exercise sessions.   Initial Home Exercises Provided -- -- -- -- 01/21/24     Oxygen   Maintain Oxygen Saturation -- 88% or higher 88% or higher 88% or higher 88% or higher    Row Name 01/22/24 1700 02/03/24 1400           Response to Exercise   Blood Pressure (Admit) 134/70 112/60      Blood Pressure (Exercise) 178/76 162/78      Blood Pressure (Exit) 128/68 114/64      Heart Rate (Admit) 70 bpm 71 bpm      Heart Rate (Exercise) 114 bpm 117 bpm      Heart Rate (Exit) 93 bpm 79 bpm      Oxygen Saturation (Admit) 98 % 96 %      Oxygen Saturation (Exercise) 95 % 94 %      Oxygen Saturation (Exit) 97 % 98 %      Rating of Perceived Exertion (Exercise) 13 15      Perceived Dyspnea (Exercise) 0 --      Symptoms PAD pain 3/5 on CPS PAD pain 2/5 on CPS      Duration Continue with 30 min of aerobic exercise without signs/symptoms of physical distress. Continue with 30 min of aerobic exercise without signs/symptoms of physical distress.      Intensity THRR unchanged THRR unchanged        Progression    Progression Continue to progress workloads to  maintain intensity without signs/symptoms of physical distress. Continue to progress workloads to maintain intensity without signs/symptoms of physical distress.      Average METs 4.96 5.19        Resistance Training   Training Prescription Yes Yes      Weight 8 lb 8 lb      Reps 10-15 10-15        Interval Training   Interval Training No No        Treadmill   MPH 2.2 2.2      Grade 8.5 9      Minutes 15 15      METs 5.26 5.41        NuStep   Level 5 --      Minutes 15 --      METs 4 --        Home Exercise Plan   Plans to continue exercise at [employer] (comment)  senior center/ [facility] [employer] (comment[facility]  sen[facility] center[facility]      Frequency Add 2 additional days to program exercise sessions. Add 2 additional days to program exercise sessions.      Initial Home Exercises Provided 01/21/24 01/21/24        Oxygen   Maintain Oxygen Saturation 88% or higher 88% or higher               Exercise Comments:   Exercise Comments     Row Name 12/04/23 1751           Exercise Comments First full day of exercise!  Patient was oriented to gym and equipment including functions, settings, policies, and procedures.  Patient's individual exercise prescription and treatment plan were reviewed.  All starting workloads were established based on the results of the 6 minute walk test done at initial orientation visit.  The plan for exercise progression was also introduced and progression will be customized based on patient's performance and goals.                Program and Patient Goals Exercise Goals and Review:   Exercise Goals     Row Name 12/01/23 1556             Exercise Goals   Increase Physical Activity Yes       Intervention Provide advice, education, support and counseling about physical activity/exercise needs.;Develop an individualized exercise prescription for aerobic and resistive  training based on initial evaluation findings, risk stratification, comorbidities and participant's personal goals.       Expected Outcomes Short Term: Attend rehab on a regular basis to increase amount of physical activity.;Long Term: Add in home exercise to make exercise part of routine and to increase amount of physical activity.;Long Term: Exercising regularly at least 3-5 days a week.       Increase Strength and Stamina Yes       Intervention Provide advice, education, support and counseling about physical activity/exercise needs.;Develop an individualized exercise prescription for aerobic and resistive training based on initial evaluation findings, risk stratification, comorbidities and participant's personal goals.       Expected Outcomes Short Term: Increase workloads from initial exercise prescription for resistance, speed, and METs.;Short Term: Perform resistance training exercises routinely during rehab and add in resistance training at home;Long Term: Improve cardiorespiratory fitness, muscular endurance and strength as measured by increased METs and functional capacity ( )       Able to understand and use rate of perceived exertion (RPE) scale Yes  Intervention Provide education and explanation on how to use RPE scale       Expected Outcomes Short Term: Able to use RPE daily in rehab to express subjective intensity level;Long Term:  Able to use RPE to guide intensity level when exercising independently       Able to understand and use Dyspnea scale Yes       Intervention Provide education and explanation on how to use Dyspnea scale       Expected Outcomes Short Term: Able to use Dyspnea scale daily in rehab to express subjective sense of shortness of breath during exertion;Long Term: Able to use Dyspnea scale to guide intensity level when exercising independently       Knowledge and understanding of Target Heart Rate Range (THRR) Yes       Intervention Provide education and  explanation of THRR including how the numbers were predicted and where they are located for reference       Expected Outcomes Short Term: Able to state/look up THRR;Long Term: Able to use THRR to govern intensity when exercising independently;Short Term: Able to use daily as guideline for intensity in rehab       Able to check pulse independently Yes       Intervention Review the importance of being able to check your own pulse for safety during independent exercise;Provide education and demonstration on how to check pulse in carotid and radial arteries.       Expected Outcomes Short Term: Able to explain why pulse checking is important during independent exercise;Long Term: Able to check pulse independently and accurately       Understanding of Exercise Prescription Yes       Intervention Provide education, explanation, and written materials on patient's individual exercise prescription       Expected Outcomes Short Term: Able to explain program exercise prescription;Long Term: Able to explain home exercise prescription to exercise independently       Improve claudication pain toleration; Improve walking ability Yes       Intervention Participate in PAD/SET Rehab 2-3 days a week, walking at home as part of exercise prescription;Attend education sessions to aid in risk factor modification and understanding of disease process       Expected Outcomes Short Term: Improve walking distance/time to onset of claudication pain;Long Term: Improve score of PAD questionnaires                Nutrition:  Target Goals: Understanding of nutrition guidelines, daily intake of sodium 1500mg , cholesterol 200mg , calories 30% from fat and 7% or less from saturated fats, daily to have 5 or more servings of fruits and vegetables.  Biometrics:  Pre Biometrics - 12/01/23 1557       Pre Biometrics   Height 5' 7.75" (1.721 m)    Weight 173 lb 8 oz (78.7 kg)    Waist Circumference 37 inches    Hip Circumference  38 inches    Waist to Hip Ratio 0.97 %    BMI (Calculated) 26.57    Single Leg Stand 30 seconds              Nutrition Therapy Plan and Nutrition Goals:  Nutrition Therapy & Goals - 12/01/23 1558       Nutrition Therapy   RD appointment deferred Yes             Core Components/Risk Factors/Patient Goals at Admission:  Personal Goals and Risk Factors at Admission - 12/01/23 1558  Core Components/Risk Factors/Patient Goals on Admission    Weight Management Yes    Intervention Weight Management: Develop a combined nutrition and exercise program designed to reach desired caloric intake, while maintaining appropriate intake of nutrient and fiber, sodium and fats, and appropriate energy expenditure required for the weight goal.;Weight Management: Provide education and appropriate resources to help participant work on and attain dietary goals.;Weight Management/Obesity: Establish reasonable short term and long term weight goals.    Admit Weight 173 lb 8 oz (78.7 kg)    Goal Weight: Short Term 173 lb 8 oz (78.7 kg)    Goal Weight: Long Term 173 lb 8 oz (78.7 kg)    Expected Outcomes Short Term: Continue to assess and modify interventions until short term weight is achieved;Weight Maintenance: Understanding of the daily nutrition guidelines, which includes 25-35% calories from fat, 7% or less cal from saturated fats, less than 200mg  cholesterol, less than 1.5gm of sodium, & 5 or more servings of fruits and vegetables daily;Understanding recommendations for meals to include 15-35% energy as protein, 25-35% energy from fat, 35-60% energy from carbohydrates, less than 200mg  of dietary cholesterol, 20-35 gm of total fiber daily;Understanding of distribution of calorie intake throughout the day with the consumption of 4-5 meals/snacks    Tobacco Cessation Yes    Intervention Assist the participant in steps to quit. Provide individualized education and counseling about committing to  Tobacco Cessation, relapse prevention, and pharmacological support that can be provided by physician.;Education officer, environmental, assist with locating and accessing local/national Quit Smoking programs, and support quit date choice.    Expected Outcomes Short Term: Will demonstrate readiness to quit, by selecting a quit date.;Short Term: Will quit all tobacco product use, adhering to prevention of relapse plan.;Long Term: Complete abstinence from all tobacco products for at least 12 months from quit date.    Diabetes Yes    Intervention Provide education about signs/symptoms and action to take for hypo/hyperglycemia.;Provide education about proper nutrition, including hydration, and aerobic/resistive exercise prescription along with prescribed medications to achieve blood glucose in normal ranges: Fasting glucose 65-99 mg/dL    Expected Outcomes Short Term: Participant verbalizes understanding of the signs/symptoms and immediate care of hyper/hypoglycemia, proper foot care and importance of medication, aerobic/resistive exercise and nutrition plan for blood glucose control.;Long Term: Attainment of HbA1C < 7%.    Hypertension Yes    Intervention Provide education on lifestyle modifcations including regular physical activity/exercise, weight management, moderate sodium restriction and increased consumption of fresh fruit, vegetables, and low fat dairy, alcohol moderation, and smoking cessation.;Monitor prescription use compliance.    Expected Outcomes Short Term: Continued assessment and intervention until BP is < 140/104mm HG in hypertensive participants. < 130/67mm HG in hypertensive participants with diabetes, heart failure or chronic kidney disease.;Long Term: Maintenance of blood pressure at goal levels.    Lipids Yes    Intervention Provide education and support for participant on nutrition & aerobic/resistive exercise along with prescribed medications to achieve LDL 70mg , HDL >40mg .    Expected  Outcomes Short Term: Participant states understanding of desired cholesterol values and is compliant with medications prescribed. Participant is following exercise prescription and nutrition guidelines.;Long Term: Cholesterol controlled with medications as prescribed, with individualized exercise RX and with personalized nutrition plan. Value goals: LDL < 70mg , HDL > 40 mg.             ITP Comments:  ITP Comments     Row Name 12/01/23 [phone] 12/01/23 1541 12/04/23 1750 12/10/23 0850 01/07/24 1129  ITP Comments Virtual Visit completed. Patient informed on EP and RD appointment and 6 Minute walk test. Patient also informed of patient health questionnaires on My Chart. Patient Verbalizes understanding. Visit diagnosis can be found in [facility] 11/14/2023. Completed and gym orientation. Initial ITP created and sent for review to Dr. Firman Hughes, Medical Director. Ron is a current tobacco user. Intervention for tobacco cessation was provided at the initial medical review.He was asked about readiness to quit and reported he doesn't smoke that much, just occationally. Patient was advised and educated about tobacco cessation using combination therapy, tobacco cessation classes, quit line, and quit smoking apps. Patient demonstrated understanding of this material. Staff will continue to provide encouragement and follow up with the patient throughout the program. First full day of exercise!  Patient was oriented to gym and equipment including functions, settings, policies, and procedures.  Patient's individual exercise prescription and treatment plan were reviewed.  All starting workloads were established based on the results of the 6 minute walk test done at initial orientation visit.  The plan for exercise progression was also introduced and progression will be customized based on patient's performance and goals. 30 Day review completed. Medical Director ITP review done, changes made as directed, and signed  approval by Medical Director. New to program. 30 Day review completed. Medical Director ITP review done, changes made as directed, and signed approval by Medical Director.    Row Name 02/04/24 1307           ITP Comments 30 Day review completed. Medical Director ITP review done, changes made as directed, and signed approval by Medical Director.                Comments: 30 day review   Physician Documentation: Your signature/cosign is required to indicate approval of the Care Plan as stated above. By signing this report, you are approving the Plan of Care. Please sign and either send electronically or print and fax the signed copy to the number below. Please indicate any changes or limitations in the space provided.   ____________________________________________________________________________________________________________________________________________  Physician Signature: _____________________________________________________ Print Physician Name: ____________________________________________________ Date: ___________________________ Time: _________________________________  [facility] PAD/SET Program [address] [place], [place] [address] Phone: [phone] Fax: [phone]

## 2024-02-04 NOTE — Progress Notes (Signed)
 30 Day review completed. Medical Director ITP review done, changes made as directed, and signed approval by Medical Director. ? ?

## 2024-02-05 ENCOUNTER — Encounter: Admitting: *Deleted

## 2024-02-05 DIAGNOSIS — I70213 Atherosclerosis of native arteries of extremities with intermittent claudication, bilateral legs: Secondary | ICD-10-CM | POA: Diagnosis not present

## 2024-02-05 DIAGNOSIS — I739 Peripheral vascular disease, unspecified: Secondary | ICD-10-CM

## 2024-02-05 NOTE — Progress Notes (Signed)
 Daily Session Note  Patient Details  Name: Daniel Fuentes MRN: [id number] Date of Birth: 1951/06/26 Referring Provider:   Flowsheet Row Cardiac Rehab from 12/01/2023 in [facility] Cardiac and Pulmonary Rehab  Referring Provider Dr. Antionette Kirks       Encounter Date: 02/05/2024  Check In:  Session Check In - 02/05/24 1731       Check-In   Supervising physician immediately available to respond to emergencies PAD/SET Supervising Physician    Physician(s) Dr. Peggi Bowels and Karlynn Oyster    Location [facility]-Cardiac & Pulmonary Rehab    Staff Present Sue Em RN,BSN;Maxon Conetta BS, Exercise Physiologist;Joseph Lacinda Pica RCP,RRT,BSRT    Virtual Visit No    Medication changes reported     No    Fall or balance concerns reported    No    Tobacco Cessation No Change    Current number of cigarettes/nicotine per day     4    Warm-up and Cool-down Performed on first and last piece of equipment    Resistance Training Performed Yes    VAD Patient? No    PAD/SET Patient? Yes      PAD/SET Patient   Completed foot check today? Yes    Open wounds to report? No      Pain Assessment   Currently in Pain? No/denies                Social History   Tobacco Use  Smoking Status Every Day   Current packs/day: 0.00   Average packs/day: 0.2 packs/day for 55.4 years (11.1 ttl pk-yrs)   Types: Pipe, Cigarettes   Start date: 09/04/1966   Last attempt to quit: 02/08/2022   Years since quitting: 1.9  Smokeless Tobacco Never  Tobacco Comments   Smokes 7 cigarettes in a week.     Goals Met:  Independence with exercise equipment Exercise tolerated well No report of concerns or symptoms today Strength training completed today  Goals Unmet:  Not Applicable  Comments: Pt able to follow exercise prescription today without complaint.  Will continue to monitor for progression.    Dr. Firman Hughes is Medical Director for [facility] Cardiac Rehabilitation.  Dr. Fuad Aleskerov is Medical Director for [facility]  Pulmonary Rehabilitation.

## 2024-02-09 ENCOUNTER — Encounter: Admitting: *Deleted

## 2024-02-09 DIAGNOSIS — I70213 Atherosclerosis of native arteries of extremities with intermittent claudication, bilateral legs: Secondary | ICD-10-CM | POA: Diagnosis not present

## 2024-02-09 DIAGNOSIS — I739 Peripheral vascular disease, unspecified: Secondary | ICD-10-CM

## 2024-02-09 NOTE — Progress Notes (Signed)
 Daily Session Note  Patient Details  Name: Daniel Fuentes MRN: [id number] Date of Birth: 24-Mar-1951 Referring Provider:   Flowsheet Row Cardiac Rehab from 12/01/2023 in [facility] Cardiac and Pulmonary Rehab  Referring Provider Dr. Antionette Kirks       Encounter Date: 02/09/2024  Check In:  Session Check In - 02/09/24 1717       Check-In   Supervising physician immediately available to respond to emergencies PAD/SET Supervising Physician    Physician(s) Dr. Karlynn Oyster and Ray    Location [facility]-Cardiac & Pulmonary Rehab    Staff Present Sue Em RN,BSN;Joseph [facility] BS, ACSM CEP, Exercise Physiologist    Virtual Visit No    Medication changes reported     No    Fall or balance concerns reported    No    Tobacco Cessation No Change    Current number of cigarettes/nicotine per day     4    Warm-up and Cool-down Performed on first and last piece of equipment    Resistance Training Performed Yes    VAD Patient? No    PAD/SET Patient? Yes      PAD/SET Patient   Completed foot check today? Yes    Open wounds to report? No      Pain Assessment   Currently in Pain? No/denies                Social History   Tobacco Use  Smoking Status Every Day   Current packs/day: 0.00   Average packs/day: 0.2 packs/day for 55.4 years (11.1 ttl pk-yrs)   Types: Pipe, Cigarettes   Start date: 09/04/1966   Last attempt to quit: 02/08/2022   Years since quitting: 2.0  Smokeless Tobacco Never  Tobacco Comments   Smokes 7 cigarettes in a week.     Goals Met:  Independence with exercise equipment Exercise tolerated well No report of concerns or symptoms today Strength training completed today  Goals Unmet:  Not Applicable  Comments: Pt able to follow exercise prescription today without complaint.  Will continue to monitor for progression.    Dr. Firman Hughes is Medical Director for [facility] Cardiac Rehabilitation.  Dr. Fuad Aleskerov is Medical Director for  [facility] Pulmonary Rehabilitation.

## 2024-02-11 ENCOUNTER — Encounter: Admitting: *Deleted

## 2024-02-11 DIAGNOSIS — I739 Peripheral vascular disease, unspecified: Secondary | ICD-10-CM

## 2024-02-11 DIAGNOSIS — I70213 Atherosclerosis of native arteries of extremities with intermittent claudication, bilateral legs: Secondary | ICD-10-CM | POA: Diagnosis not present

## 2024-02-11 NOTE — Progress Notes (Signed)
 Daily Session Note  Patient Details  Name: Argus Vandecar MRN: [id number] Date of Birth: Jul 28, 1951 Referring Provider:   Flowsheet Row Cardiac Rehab from 12/01/2023 in [facility] Cardiac and Pulmonary Rehab  Referring Provider Dr. Antionette Kirks       Encounter Date: 02/11/2024  Check In:  Session Check In - 02/11/24 1722       Check-In   Supervising physician immediately available to respond to emergencies PAD/SET Supervising Physician    Physician(s) Dr. Martina Sledge and [facility]    Location [facility]-Cardiac & Pulmonary Rehab    Staff Present Sue Em RN,BSN;Kelly Sabra Cramp BS, ACSM CEP, Exercise Physiologist;Joseph Lacinda Pica RCP,RRT,BSRT    Virtual Visit No    Medication changes reported     No    Fall or balance concerns reported    No    Warm-up and Cool-down Performed on first and last piece of equipment    Resistance Training Performed Yes    VAD Patient? No    PAD/SET Patient? Yes      PAD/SET Patient   Completed foot check today? Yes    Open wounds to report? No      Pain Assessment   Currently in Pain? No/denies                Social History   Tobacco Use  Smoking Status Every Day   Current packs/day: 0.00   Average packs/day: 0.2 packs/day for 55.4 years (11.1 ttl pk-yrs)   Types: Pipe, Cigarettes   Start date: 09/04/1966   Last attempt to quit: 02/08/2022   Years since quitting: 2.0  Smokeless Tobacco Never  Tobacco Comments   Smokes 7 cigarettes in a week.     Goals Met:  Independence with exercise equipment Exercise tolerated well No report of concerns or symptoms today Strength training completed today  Goals Unmet:  Not Applicable  Comments: Pt able to follow exercise prescription today without complaint.  Will continue to monitor for progression.    Dr. Firman Hughes is Medical Director for [facility] Cardiac Rehabilitation.  Dr. Fuad Aleskerov is Medical Director for [facility] Pulmonary Rehabilitation.

## 2024-02-12 ENCOUNTER — Encounter: Admitting: *Deleted

## 2024-02-12 DIAGNOSIS — I739 Peripheral vascular disease, unspecified: Secondary | ICD-10-CM

## 2024-02-12 DIAGNOSIS — I70213 Atherosclerosis of native arteries of extremities with intermittent claudication, bilateral legs: Secondary | ICD-10-CM | POA: Diagnosis not present

## 2024-02-12 NOTE — Progress Notes (Signed)
 Daily Session Note  Patient Details  Name: Anthany Polit MRN: [id number] Date of Birth: [birth year] Referring Provider:   Flowsheet Row Cardiac Rehab from 12/01/2023 in [facility] Cardiac and Pulmonary Rehab  Referring Provider Dr. Antionette Kirks       Encounter Date: 02/12/2024  Check In:  Session Check In - 02/12/24 1719       Check-In   Supervising physician immediately available to respond to emergencies PAD/SET Supervising Physician    Physician(s) Dr. Karlynn Oyster and [facility]    Location [facility]-Cardiac & Pulmonary Rehab    Staff Present Sue Em RN,BSN;Maxon Conetta BS, Exercise Physiologist;Joseph Lacinda Pica RCP,RRT,BSRT    Virtual Visit No    Medication changes reported     No    Fall or balance concerns reported    No    Tobacco Cessation No Change    Current number of cigarettes/nicotine per day     4    Warm-up and Cool-down Performed on first and last piece of equipment    Resistance Training Performed Yes    VAD Patient? No    PAD/SET Patient? Yes      PAD/SET Patient   Completed foot check today? Yes    Open wounds to report? No      Pain Assessment   Currently in Pain? No/denies                Social History   Tobacco Use  Smoking Status Every Day   Current packs/day: 0.00   Average packs/day: 0.2 packs/day for 55.4 years (11.1 ttl pk-yrs)   Types: Pipe, Cigarettes   Start date: 09/04/1966   Last attempt to quit: 02/08/2022   Years since quitting: 2.0  Smokeless Tobacco Never  Tobacco Comments   Smokes 7 cigarettes in a week.     Goals Met:  Independence with exercise equipment Exercise tolerated well No report of concerns or symptoms today Strength training completed today  Goals Unmet:  Not Applicable  Comments: Pt able to follow exercise prescription today without complaint.  Will continue to monitor for progression.    Dr. Firman Hughes is Medical Director for [facility] Cardiac Rehabilitation.  Dr. Fuad Aleskerov is Medical Director for  [facility] Pulmonary Rehabilitation.

## 2024-02-16 ENCOUNTER — Encounter: Admitting: *Deleted

## 2024-02-16 VITALS — Ht 67.75 in | Wt 173.9 lb

## 2024-02-16 DIAGNOSIS — I70213 Atherosclerosis of native arteries of extremities with intermittent claudication, bilateral legs: Secondary | ICD-10-CM | POA: Diagnosis not present

## 2024-02-16 DIAGNOSIS — I739 Peripheral vascular disease, unspecified: Secondary | ICD-10-CM

## 2024-02-16 NOTE — Progress Notes (Signed)
 Daily Session Note  Patient Details  Name: Daniel Fuentes MRN: [id number] Date of Birth: 12/07/1950 Referring Provider:   Flowsheet Row Cardiac Rehab from 12/01/2023 in [facility] Cardiac and Pulmonary Rehab  Referring Provider Dr. Antionette Kirks       Encounter Date: 02/16/2024  Check In:  Session Check In - 02/16/24 1724       Check-In   Supervising physician immediately available to respond to emergencies PAD/SET Supervising Physician    Physician(s) Dr. Alejo Amsler and [facility]    Location [facility]-Cardiac & Pulmonary Rehab    Staff Present Freddrick Jaffe BS, ACSM CEP, Exercise Physiologist;[facility] RN,BSN;Margaret Best, MS, Exercise Physiologist    Virtual Visit No    Medication changes reported     No    Fall or balance concerns reported    No    Tobacco Cessation No Change    Current number of cigarettes/nicotine per day     4    Warm-up and Cool-down Performed on first and last piece of equipment    Resistance Training Performed Yes    PAD/SET Patient? Yes      PAD/SET Patient   Completed foot check today? Yes    Open wounds to report? No      Pain Assessment   Currently in Pain? No/denies                Social History   Tobacco Use  Smoking Status Every Day   Current packs/day: 0.00   Average packs/day: 0.2 packs/day for 55.4 years (11.1 ttl pk-yrs)   Types: Pipe, Cigarettes   Start date: 09/04/1966   Last attempt to quit: 02/08/2022   Years since quitting: 2.0  Smokeless Tobacco Never  Tobacco Comments   Smokes 7 cigarettes in a week.     Goals Met:  Independence with exercise equipment Exercise tolerated well No report of concerns or symptoms today Strength training completed today  Goals Unmet:  Not Applicable  Comments: Pt able to follow exercise prescription today without complaint.  Will continue to monitor for progression.    Dr. Firman Hughes is Medical Director for [facility] Cardiac Rehabilitation.  Dr. Fuad Aleskerov is Medical Director for  [facility] Pulmonary Rehabilitation.

## 2024-02-18 ENCOUNTER — Encounter: Admitting: *Deleted

## 2024-02-18 DIAGNOSIS — I739 Peripheral vascular disease, unspecified: Secondary | ICD-10-CM

## 2024-02-18 DIAGNOSIS — I70213 Atherosclerosis of native arteries of extremities with intermittent claudication, bilateral legs: Secondary | ICD-10-CM | POA: Diagnosis not present

## 2024-02-18 NOTE — Progress Notes (Signed)
 Daily Session Note  Patient Details  Name: Dwight Adamczak MRN: [id number] Date of Birth: [birth year] Referring Provider:   Flowsheet Row Cardiac Rehab from 12/01/2023 in [facility] Cardiac and Pulmonary Rehab  Referring Provider Dr. Antionette Kirks       Encounter Date: 02/18/2024  Check In:  Session Check In - 02/18/24 1724       Check-In   Supervising physician immediately available to respond to emergencies See telemetry face sheet for immediately available ER MD    Location [facility]-Cardiac & Pulmonary Rehab    Staff Present Lyell Samuel, MS, Exercise Physiologist;Kelly Dawne Euler, ACSM CEP, Exercise Physiologist;Dawaun Brancato, RN, BSN, CCRP    Virtual Visit No    Medication changes reported     No    Fall or balance concerns reported    No    Warm-up and Cool-down Performed on first and last piece of equipment    Resistance Training Performed Yes    PAD/SET Patient? No      PAD/SET Patient   Completed foot check today? Yes    Open wounds to report? No      Pain Assessment   Currently in Pain? No/denies                Social History   Tobacco Use  Smoking Status Every Day   Current packs/day: 0.00   Average packs/day: 0.2 packs/day for 55.4 years (11.1 ttl pk-yrs)   Types: Pipe, Cigarettes   Start date: 09/04/1966   Last attempt to quit: 02/08/2022   Years since quitting: 2.0  Smokeless Tobacco Never  Tobacco Comments   Smokes 7 cigarettes in a week.     Goals Met:  Independence with exercise equipment Exercise tolerated well No report of concerns or symptoms today  Goals Unmet:  Not Applicable  Comments: Pt able to follow exercise prescription today without complaint.  Will continue to monitor for progression.    Dr. Firman Hughes is Medical Director for [facility] Cardiac Rehabilitation.  Dr. Fuad Aleskerov is Medical Director for [facility] Pulmonary Rehabilitation.

## 2024-02-19 ENCOUNTER — Encounter: Admitting: *Deleted

## 2024-02-19 DIAGNOSIS — I70213 Atherosclerosis of native arteries of extremities with intermittent claudication, bilateral legs: Secondary | ICD-10-CM | POA: Diagnosis not present

## 2024-02-19 DIAGNOSIS — I739 Peripheral vascular disease, unspecified: Secondary | ICD-10-CM

## 2024-02-19 NOTE — Progress Notes (Signed)
 Daily Session Note  Patient Details  Name: Daniel Fuentes MRN: [id number] Date of Birth: 16-Apr-1951 Referring Provider:   Flowsheet Row Cardiac Rehab from 12/01/2023 in [facility] Cardiac and Pulmonary Rehab  Referring Provider Dr. Antionette Kirks       Encounter Date: 02/19/2024  Check In:  Session Check In - 02/19/24 1727       Check-In   Supervising physician immediately available to respond to emergencies PAD/SET Supervising Physician    Physician(s) Dr. Hendrick Locke and [facility]    Location [facility]-Cardiac & Pulmonary Rehab    Staff Present Maxon Conetta BS, Exercise Physiologist;Joseph Lacinda Pica RCP,RRT,BSRT;Franchon Ketterman Manson Seitz RN,BSN    Virtual Visit No    Medication changes reported     No    Fall or balance concerns reported    No    Warm-up and Cool-down Performed on first and last piece of equipment    Resistance Training Performed Yes    VAD Patient? No    PAD/SET Patient? Yes      PAD/SET Patient   Completed foot check today? Yes    Open wounds to report? No      Pain Assessment   Currently in Pain? No/denies                Social History   Tobacco Use  Smoking Status Every Day   Current packs/day: 0.00   Average packs/day: 0.2 packs/day for 55.4 years (11.1 ttl pk-yrs)   Types: Pipe, Cigarettes   Start date: 09/04/1966   Last attempt to quit: 02/08/2022   Years since quitting: 2.0  Smokeless Tobacco Never  Tobacco Comments   Smokes 7 cigarettes in a week.     Goals Met:  Independence with exercise equipment Exercise tolerated well No report of concerns or symptoms today Strength training completed today  Goals Unmet:  Not Applicable  Comments:  Daniel Fuentes graduated today from  rehab with 34 sessions completed.  Details of the patient's exercise prescription and what He needs to do in order to continue the prescription and progress were discussed with patient.  Patient was given a copy of prescription and goals.  Patient verbalized understanding. Daniel Fuentes plans to  continue to exercise by walking.    Dr. Firman Hughes is Medical Director for [facility] Cardiac Rehabilitation.  Dr. Fuad Aleskerov is Medical Director for [facility] Pulmonary Rehabilitation.

## 2024-02-19 NOTE — Progress Notes (Signed)
 PAD/SET  Plan of Care  Patient Details  Name: Daniel Fuentes MRN: [id number] Date of Birth: 10-24-1950 Referring Provider:   Flowsheet Row Cardiac Rehab from 12/01/2023 in [facility] Cardiac and Pulmonary Rehab  Referring Provider Dr. Antionette Kirks       Initial Encounter Date:  Flowsheet Row Cardiac Rehab from 12/01/2023 in [facility] Cardiac and Pulmonary Rehab  Date 12/01/23       Visit Diagnosis: PAD (peripheral artery disease) (HCC)  Patient's Home Medications on Admission:  Current Outpatient Medications:    amLODipine  (NORVASC ) 10 MG tablet, Take 1 tablet by mouth once daily, Disp: 90 tablet, Rfl: 0   aspirin  EC 81 MG tablet, Take 1 tablet (81 mg total) by mouth daily., Disp: 30 tablet, Rfl: 0   carvedilol  (COREG ) 6.25 MG tablet, TAKE 1 TABLET BY MOUTH TWICE DAILY WITH A MEAL, Disp: 180 tablet, Rfl: 3   Cholecalciferol (VITAMIN D3) 2000 units capsule, Take 2,000 Units by mouth daily., Disp: , Rfl:    clopidogrel  (PLAVIX ) 75 MG tablet, Take 1 tablet by mouth once daily with breakfast, Disp: 90 tablet, Rfl: 3   Evolocumab  (REPATHA  SURECLICK) 140 MG/ML SOAJ, Inject 140 mg into the skin every 14 (fourteen) days., Disp: 2 mL, Rfl: 11   losartan  (COZAAR ) 25 MG tablet, Take 1 tablet by mouth once daily, Disp: 90 tablet, Rfl: 3   metFORMIN  (GLUCOPHAGE ) 1000 MG tablet, Take 1 tablet (1,000 mg total) by mouth daily. Start tomorrow, Disp: , Rfl:    Multiple Vitamins-Minerals (MULTIVITAMIN WITH MINERALS) tablet, Take 1 tablet by mouth daily., Disp: , Rfl:    nitroGLYCERIN  (NITROSTAT ) 0.4 MG SL tablet, Place 1 tablet (0.4 mg total) under the tongue every 5 (five) minutes as needed for chest pain., Disp: 30 tablet, Rfl: 0   solifenacin (VESICARE) 10 MG tablet, Take 10 mg by mouth daily., Disp: , Rfl:    tamsulosin  (FLOMAX ) 0.4 MG CAPS capsule, Take 0.4 mg by mouth daily., Disp: , Rfl:    vitamin C (ASCORBIC ACID) 500 MG tablet, Take 500 mg by mouth., Disp: , Rfl:   Past Medical History: Past Medical  History:  Diagnosis Date   CAD (coronary artery disease)    a. 01/2022 NSTEMI/PCI: LM nl, LAD 61m, D1 50, D3 90, LCX 109m, 95/60d, OM2 80, RCA 100p/m, 10m, 70d (3.5x12, 3.0x38, & 3.5x8 Onyx Frontier DES), RPAV1 90 (2.0x12 Onyx Frontier DES), RPAV2 99, RPL1 50, RPL2 99. EF 50-55%; b. 01/2022 Staged PCI: LCX (3.5x18 & 3.0x38 Onyx Frontier DES), OM2 (3.0x22 Onyx Frontier DES).   CKD (chronic kidney disease), stage II    Diabetes mellitus without complication (HCC)    Hyperlipidemia    Hypertension    Ischemic cardiomyopathy    a. 01/2022 Echo: EF 50-55%, mod LVH, GRI DD, mild basal-mid inf/infsept HK. Mildly reduced RV fxn, Mildly dil RA. Mild MR. MIld-mod TR. AoV sclerosis w/o stenosis.    Tobacco Use: Social History   Tobacco Use  Smoking Status Every Day   Current packs/day: 0.00   Average packs/day: 0.2 packs/day for 55.4 years (11.1 ttl pk-yrs)   Types: Pipe, Cigarettes   Start date: 09/04/1966   Last attempt to quit: 02/08/2022   Years since quitting: 2.0  Smokeless Tobacco Never  Tobacco Comments   Smokes 7 cigarettes in a week.     Labs: Review Flowsheet  More data exists      Latest Ref Rng & Units 05/27/2017 12/26/2017 05/14/2018 02/08/2022 03/13/2022  Labs for ITP Cardiac and Pulmonary Rehab  Cholestrol  0 - 200 mg/dL 093  818  - 299  371   LDL (calc) 0 - 99 mg/dL 696  789  - 381  82   HDL-C >40 mg/dL 40  37  - 48  49   Trlycerides <150 mg/dL 017  510  - 258  53   Hemoglobin A1c 4.8 - 5.6 % - 8.4  7.1  5.9  -     Exercise Target Goals: Exercise Program Goal: Individual exercise prescription set with THRR, safety & activity barriers. Participant demonstrates ability to understand and report RPE using RPE 6-20 scale, to self-measure pulse accurately, and to acknowledge the importance of the exercise prescription.  Exercise Prescription Goal: Use initial exercise assessment to set exercise prescription to improve time and distance to onset of claudication pain. Provide  education to aid in steps toward risk factor modification, improve quality of life with claudication, and utilize THRR and exercise prescription for best results for decreasing symptoms of PAD. Prevent injury to bone, joints, and skin integrity during the exercise through checks of each system during session check in. Following physician guidelines for any contraindications to specific exercises.  Activity Barriers:  Activity Barriers & Cardiac Risk Stratification - 12/01/23 1550       Activity Barriers & Cardiac Risk Stratification   Activity Barriers Other (comment)    Comments AAA BP restrictions >140/90 unless MD provides other restrictions             [facility] Treadmill Test Assessment:  Treadmill Test     Row Name 12/01/23 1546 02/16/24 1754     Treadmill Test   Phase Pre Post   Time to Claudication Onset 4 minutes 14 minutes   Total Time to 3-4/5 Claudication Pain 8 minutes 14 minutes  Test was stopped due to AAA BP restriction not leg pain     Heart Rate   Rest 83 bpm 77 bpm   0% Grade 92 bpm 89 bpm   2% Grade 96 bpm 94 bpm   4% Grade 99 bpm 96 bpm   6% Grade 108 bpm 99 bpm   8% Grade -- 102 bpm   10% Grade -- 105 bpm   12% Grade -- 109 bpm   2 Min Post 74 bpm 80 bpm     Blood Pressure   Rest 122/80 120/80   0% 136/80 140/80   2% Grade 158/80 150/84   4% Grade 164/84 154/70   6% Grade 168/86 158/80   8% Grade -- 160/86   10% Grade -- 160/86   12% Grade -- 180/80   2 Min Post 128/82 132/80     Claudication Score   Rest 1  numbness 1   0% 1 1   2% Grade 2 1   4% Grade 2 1   6% Grade 4 1   8% Grade -- 1   10% Grade -- 1   12% Grade -- 2   2 Min Post 1 1     RPE   0% 13 9   2% Grade 13 11   4% Grade 13 11   6% Grade 15 12   8% Grade -- 12   10% Grade -- 12   12% Grade -- 13     Time in Stage   0% 2 minutes 2 minutes   2% Grade 2 minutes 2 minutes   4% Grade 2 minutes 2 minutes   6% Grade 2 minutes 2 minutes   8% Grade -- 2  minutes   10%  Grade -- 2 minutes   12% Grade -- 2 minutes            Walking Impairment Questionnaire:   Expectation is that scores will be within the SD range. Distance Pre % Score Mean: 38% (SD 12% - 0.64%)   Post % Score Mean: 55% (SD 26% - 84%)   Mean % Change: 18% (SD (-10%) - (46%))  Speed Pre % Score Mean: 41% (SD 19% - 63%)   Post % Score Mean: 52% (SD 30% - 74%)   Mean % Change: 11% (SD (-9%) - (31%))  Stairs Pre % Score Mean: 55% (SD 23% - 87%)   Post % Score Mean: 68% (SD 39% - 97%)   Mean % Change: 14% (SD (-15%) - (43%))  Total Score Pre % Score Mean: 45% (SD 23% - 67%   Post % Score Mean: 58% (SD 36% - 80%)   Mean % Change: 14% (SD (-5%) - (43%))   Oxygen Initial Assessment:   Oxygen Re-Evaluation:   Oxygen Discharge (Final Oxygen Re-Evaluation):   Initial Exercise Prescription:  Initial Exercise Prescription - 12/01/23 1500       Date of Initial Exercise RX and Referring Provider   Date 12/01/23    Referring Provider Dr. Antionette Kirks      Oxygen   Maintain Oxygen Saturation 88% or higher      Treadmill   MPH 2    Grade 2    Minutes 15    METs 3.08      NuStep   Level 3    SPM 80    Minutes 15    METs 3      Biostep-RELP   Level 3    SPM 50    Minutes 15    METs 3      Prescription Details   Frequency (times per week) 3    Duration Progress to 30 minutes of continuous aerobic without signs/symptoms of physical distress      Intensity   THRR 40-80% of Max Heartrate 108-133    Ratings of Perceived Exertion 11-13    Perceived Dyspnea 0-4      Progression   Progression Continue to follow PAD protocol      Resistance Training   Training Prescription Yes    Weight 4    Reps 10-15             Perform Capillary Blood Glucose checks as needed.  Exercise Prescription Changes:   Exercise Prescription Changes     Row Name 12/01/23 1500 12/11/23 1500 12/25/23 0700 01/08/24 0800 01/21/24 1700     Response to Exercise   Blood Pressure  (Admit) 122/80 132/64 138/68 136/68 --   Blood Pressure (Exercise) 168/86 -- 158/80 164/82 --   Blood Pressure (Exit) 128/82 134/68 128/70 102/60 --   Heart Rate (Admit) 83 bpm 70 bpm 63 bpm 83 bpm --   Heart Rate (Exercise) 108 bpm 100 bpm 109 bpm 118 bpm --   Heart Rate (Exit) 74 bpm 93 bpm 70 bpm 92 bpm --   Oxygen Saturation (Admit) 98 % 95 % 98 % 97 % --   Oxygen Saturation (Exercise) 97 % 92 % 91 % 91 % --   Oxygen Saturation (Exit) 97 % 95 % 91 % 96 % --   Rating of Perceived Exertion (Exercise) 15 12 13 13  --   Perceived Dyspnea (Exercise) -- -- 0 0 --   Symptoms PAD pain  4/5 PAD pain 4/5 on cps PAD pain 4/5 on CPS PAD pain 4/5 on CPS --   Comments PAD walk test First day of exercise -- -- --   Duration -- Progress to 30 minutes of  aerobic without signs/symptoms of physical distress Progress to 30 minutes of  aerobic without signs/symptoms of physical distress Continue with 30 min of aerobic exercise without signs/symptoms of physical distress. Continue with 30 min of aerobic exercise without signs/symptoms of physical distress.   Intensity -- THRR unchanged THRR unchanged THRR unchanged THRR unchanged     Progression   Progression -- Continue to progress workloads to maintain intensity without signs/symptoms of physical distress. Continue to progress workloads to maintain intensity without signs/symptoms of physical distress. Continue to progress workloads to maintain intensity without signs/symptoms of physical distress. Continue to progress workloads to maintain intensity without signs/symptoms of physical distress.   Average METs -- 3.08 3.92 5.02 5.02     Resistance Training   Training Prescription -- Yes Yes Yes Yes   Weight -- 4 lb 4 lb 8 lb 8 lb   Reps -- 10-15 10-15 10-15 10-15     Interval Training   Interval Training -- No No No No     Oxygen   Oxygen -- Continuous -- -- --     Treadmill   MPH -- 2 2.2 2.2 2.2   Grade -- 2 6 8 8    Minutes -- 15 15 15 15    METs  -- 3.08 4.51 5.11 5.11     Home Exercise Plan   Plans to continue exercise at -- -- -- -- [employer] (comment)  [facility] center[facility]   Frequency -- -- -- -- Add 2 additional days to program exercise sessions.   Initial Home Exercises Provided -- -- -- -- 01/21/24     Oxygen   Maintain Oxygen Saturation -- 88% or higher 88% or higher 88% or higher 88% or higher    Row Name 01/22/24 1700 02/03/24 1400           Response to Exercise   Blood Pressure (Admit) 134/70 112/60      Blood Pressure (Exercise) 178/76 162/78      Blood Pressure (Exit) 128/68 114/64      Heart Rate (Admit) 70 bpm 71 bpm      Heart Rate (Exercise) 114 bpm 117 bpm      Heart Rate (Exit) 93 bpm 79 bpm      Oxygen Saturation (Admit) 98 % 96 %      Oxygen Saturation (Exercise) 95 % 94 %      Oxygen Saturation (Exit) 97 % 98 %      Rating of Perceived Exertion (Exercise) 13 15      Perceived Dyspnea (Exercise) 0 --      Symptoms PAD pain 3/5 on CPS PAD pain 2/5 on CPS      Duration Continue with 30 min of aerobic exercise without signs/symptoms of physical distress. Continue with 30 min of aerobic exercise without signs/symptoms of physical distress.      Intensity THRR unchanged THRR unchanged        Progression   Progression Continue to progress workloads to maintain intensity without signs/symptoms of physical distress. Continue to progress workloads to maintain intensity without signs/symptoms of physical distress.      Average METs 4.96 5.19        Resistance Training   Training Prescription Yes Yes      Weight  8 lb 8 lb      Reps 10-15 10-15        Interval Training   Interval Training No No        Treadmill   MPH 2.2 2.2      Grade 8.5 9      Minutes 15 15      METs 5.26 5.41        NuStep   Level 5 --      Minutes 15 --      METs 4 --        Home Exercise Plan   Plans to continue exercise at [employer] (comment)  sen[facility] center[facility] (comment[facility]   [facility]      Frequency Add 2 additional days to program exercise sessions. Add 2 additional days to program exercise sessions.      Initial Home Exercises Provided 01/21/24 01/21/24        Oxygen   Maintain Oxygen Saturation 88% or higher 88% or higher               Exercise Comments:   Exercise Comments     Row Name 12/04/23 1751           Exercise Comments First full day of exercise!  Patient was oriented to gym and equipment including functions, settings, policies, and procedures.  Patient's individual exercise prescription and treatment plan were reviewed.  All starting workloads were established based on the results of the 6 minute walk test done at initial orientation visit.  The plan for exercise progression was also introduced and progression will be customized based on patient's performance and goals.                Exercise Goals and Review:   Exercise Goals     Row Name 12/01/23 1556             Exercise Goals   Increase Physical Activity Yes       Intervention Provide advice, education, support and counseling about physical activity/exercise needs.;Develop an individualized exercise prescription for aerobic and resistive training based on initial evaluation findings, risk stratification, comorbidities and participant's personal goals.       Expected Outcomes Short Term: Attend rehab on a regular basis to increase amount of physical activity.;Long Term: Add in home exercise to make exercise part of routine and to increase amount of physical activity.;Long Term: Exercising regularly at least 3-5 days a week.       Increase Strength and Stamina Yes       Intervention Provide advice, education, support and counseling about physical activity/exercise needs.;Develop an individualized exercise prescription for aerobic and resistive training based on initial evaluation findings, risk stratification, comorbidities and participant's personal goals.        Expected Outcomes Short Term: Increase workloads from initial exercise prescription for resistance, speed, and METs.;Short Term: Perform resistance training exercises routinely during rehab and add in resistance training at home;Long Term: Improve cardiorespiratory fitness, muscular endurance and strength as measured by increased METs and functional capacity ( )       Able to understand and use rate of perceived exertion (RPE) scale Yes       Intervention Provide education and explanation on how to use RPE scale       Expected Outcomes Short Term: Able to use RPE daily in rehab to express subjective intensity level;Long Term:  Able to use RPE to guide intensity level when exercising independently  Able to understand and use Dyspnea scale Yes       Intervention Provide education and explanation on how to use Dyspnea scale       Expected Outcomes Short Term: Able to use Dyspnea scale daily in rehab to express subjective sense of shortness of breath during exertion;Long Term: Able to use Dyspnea scale to guide intensity level when exercising independently       Knowledge and understanding of Target Heart Rate Range (THRR) Yes       Intervention Provide education and explanation of THRR including how the numbers were predicted and where they are located for reference       Expected Outcomes Short Term: Able to state/look up THRR;Long Term: Able to use THRR to govern intensity when exercising independently;Short Term: Able to use daily as guideline for intensity in rehab       Able to check pulse independently Yes       Intervention Review the importance of being able to check your own pulse for safety during independent exercise;Provide education and demonstration on how to check pulse in carotid and radial arteries.       Expected Outcomes Short Term: Able to explain why pulse checking is important during independent exercise;Long Term: Able to check pulse independently and accurately        Understanding of Exercise Prescription Yes       Intervention Provide education, explanation, and written materials on patient's individual exercise prescription       Expected Outcomes Short Term: Able to explain program exercise prescription;Long Term: Able to explain home exercise prescription to exercise independently       Improve claudication pain toleration; Improve walking ability Yes       Intervention Participate in PAD/SET Rehab 2-3 days a week, walking at home as part of exercise prescription;Attend education sessions to aid in risk factor modification and understanding of disease process       Expected Outcomes Short Term: Improve walking distance/time to onset of claudication pain;Long Term: Improve score of PAD questionnaires                Exercise Goals Re-Evaluation :  Exercise Goals Re-Evaluation     Row Name 12/04/23 1751 12/11/23 1507 12/25/23 0758 01/08/24 0807 01/21/24 1751     Exercise Goal Re-Evaluation   Exercise Goals Review Increase Physical Activity;Able to understand and use rate of perceived exertion (RPE) scale;Knowledge and understanding of Target Heart Rate Range (THRR);Understanding of Exercise Prescription;Increase Strength and Stamina;Improve claudication pain tolerance and improve walking ability;Able to check pulse independently Increase Physical Activity;Increase Strength and Stamina;Understanding of Exercise Prescription Increase Physical Activity;Increase Strength and Stamina;Understanding of Exercise Prescription Increase Physical Activity;Increase Strength and Stamina;Understanding of Exercise Prescription Increase Physical Activity;Increase Strength and Stamina;Able to understand and use rate of perceived exertion (RPE) scale;Able to understand and use Dyspnea scale;Knowledge and understanding of Target Heart Rate Range (THRR);Able to check pulse independently;Understanding of Exercise Prescription   Comments Reviewed RPE and dyspnea scale, THR and  program prescription with pt today.  Pt voiced understanding and was given a copy of goals to take home. Ron is off to a good start in the program. He has been doing well walking the treadmill as tolerated by claudication pain due to PAD. He has done well walking at 2 mph with an incline of 2% on the treadmill with occasional pauses for symptom relief. We will continue to monitor his progress in the program. Ron is doing well in rehab. He  has been able to progress to an incline of 6% while stating a CPS of 4/5. We will continue to increase incline, and monitor his progress in the program. Ron continues to do well in rehab. He has progressed to an incline of 8% on the treadmill while maintaining a speed of 2.2 mph, while reporting claudication at 4/5 CPS. He also increased to 8 lb hand weights for resistance training. We will continue to monitor his progress in the program. Reviewed home exercise with pt today.  Pt plans to walking and [facility] for exercise.  Reviewed THR, pulse, RPE, sign and symptoms, pulse oximetery and when to call 911 or MD.  Also discussed weather considerations and indoor options.  Pt voiced understanding.   Expected Outcomes Short: Use RPE daily to regulate intensity.  Long: Follow program prescription in THR. Short: Continue to follow current exercise prescription. Long: Continue exercise to improve strength and stamina. Short: Continue to increase incline with respect to PAD. Long: Continue exercise to improve strength and stamina. Short: Continue to increase incline on treadmill as tolerated by PAD symptoms. Long: Continue exercise to improve strength and stamina. Short: add 1-2 days a week of exercise at home on off days of rehab. Long: maintain independent exercise routine upon graduation from rehab.    Row Name 01/22/24 1732 02/03/24 1457 02/11/24 1737         Exercise Goal Re-Evaluation   Exercise Goals Review Increase Physical Activity;Understanding of  Exercise Prescription;Increase Strength and Stamina Increase Physical Activity;Understanding of Exercise Prescription;Increase Strength and Stamina Increase Strength and Stamina;Increase Physical Activity     Comments Ron continues to do well in rehab. He has progressed to an incline of 8.5% on the treadmill while maintaining a speed of 2.2 mph, while reporting claudication at 3/5 CPS. He did do the T4 nustep at level 5 in one session due to knee pain after 20 min on treadmill. We will continue to monitor his progress in the program Ron continues to do well in rehab. He has progressed to an incline of 9% on the treadmill while maintaining a speed of 2.2 mph, while reporting claudication at 2/5 CPS. We will continue to monitor his progress in the program Ron states he is going to go to the senior center. He has a membership currently and knows what to do to keep his heart rate up. He has no questions about his exercise routine at this time.     Expected Outcomes Short: Continue to increase incline on treadmill as tolerated by PAD symptoms. Long: Continue exercise to improve strength and stamina Short: Continue to increase incline on treadmill as tolerated by PAD symptoms. Long: Continue exercise to improve strength and stamina Short: graduate [facility]. Long: maintain a workout routine independently.              Discharge Exercise Prescription (Final Exercise Prescription Changes):  Exercise Prescription Changes - 02/03/24 1400       Response to Exercise   Blood Pressure (Admit) 112/60    Blood Pressure (Exercise) 162/78    Blood Pressure (Exit) 114/64    Heart Rate (Admit) 71 bpm    Heart Rate (Exercise) 117 bpm    Heart Rate (Exit) 79 bpm    Oxygen Saturation (Admit) 96 %    Oxygen Saturation (Exercise) 94 %    Oxygen Saturation (Exit) 98 %    Rating of Perceived Exertion (Exercise) 15    Symptoms PAD pain 2/5 on CPS    Duration  Continue with 30 min of aerobic exercise without  signs/symptoms of physical distress.    Intensity THRR unchanged      Progression   Progression Continue to progress workloads to maintain intensity without signs/symptoms of physical distress.    Average METs 5.19      Resistance Training   Training Prescription Yes    Weight 8 lb    Reps 10-15      Interval Training   Interval Training No      Treadmill   MPH 2.2    Grade 9    Minutes 15    METs 5.41      Home Exercise Plan   Plans to continue exercise at [employer] [employer] (comment)   [facility]   Frequency Add 2 additional days to program exercise sessions.    Initial Home Exercises Provided 01/21/24      Oxygen   Maintain Oxygen Saturation 88% or higher             Nutrition:  Target Goals: Understanding of nutrition guidelines, daily intake of sodium 1500mg , cholesterol 200mg , calories 30% from fat and 7% or less from saturated fats, daily to have 5 or more servings of fruits and vegetables.  Biometrics:  Pre Biometrics - 12/01/23 1557       Pre Biometrics   Height 5' 7.75" (1.721 m)    Weight 173 lb 8 oz (78.7 kg)    Waist Circumference 37 inches    Hip Circumference 38 inches    Waist to Hip Ratio 0.97 %    BMI (Calculated) 26.57    Single Leg Stand 30 seconds             Post Biometrics - 02/16/24 1819        Post  Biometrics   Height 5' 7.75" (1.721 m)    Weight 173 lb 14.4 oz (78.9 kg)    Waist Circumference 37 inches    Hip Circumference 38 inches    Waist to Hip Ratio 0.97 %    BMI (Calculated) 26.63    Single Leg Stand 30 seconds             Nutrition Therapy Plan and Nutrition Goals:  Nutrition Therapy & Goals - 12/01/23 1558       Nutrition Therapy   RD appointment deferred Yes             Nutrition Discharge: Rate Your Plate Scores:   Nutrition Goals Re-Evaluation:  Nutrition Goals Re-Evaluation     Row Name 12/29/23 1741 02/11/24 1731           Goals   Comment Patient deferred RD  appontment. Patient deferred RD appontment.               Nutrition Goals Discharge (Final Nutrition Goals Re-Evaluation):  Nutrition Goals Re-Evaluation - 02/11/24 1731       Goals   Comment Patient deferred RD appontment.             Psychosocial: Target Goals: Acknowledge presence or absence of significant depression and/or stress, maximize coping skills, provide positive support system. Participant is able to verbalize types and ability to use techniques and skills needed for reducing stress and depression.   Initial Review & Psychosocial Screening:  Initial Psych Review & Screening - 12/01/23 0938       Initial Review   Current issues with None Identified      Family Dynamics   Good Support System? Yes  Comments Casey can look to his wife who is a Engineer, civil (consulting) for his main support. He does not take anything for his mood.      Barriers   Psychosocial barriers to participate in program The patient should benefit from training in stress management and relaxation.;There are no identifiable barriers or psychosocial needs.      Screening Interventions   Interventions Encouraged to exercise;Program counselor consult;To provide support and resources with identified psychosocial needs;Provide feedback about the scores to participant    Expected Outcomes Short Term goal: Utilizing psychosocial counselor, staff and physician to assist with identification of specific Stressors or current issues interfering with healing process. Setting desired goal for each stressor or current issue identified.;Long Term Goal: Stressors or current issues are controlled or eliminated.;Short Term goal: Identification and review with participant of any Quality of Life or Depression concerns found by scoring the questionnaire.;Long Term goal: The participant improves quality of Life and PHQ9 Scores as seen by post scores and/or verbalization of changes             Quality of Life Scores:    Scores  should be at or above lower SD. Change of 5 points or better indicates improvement in first 5 Factors. Maximum score for last three Factors is 6.  Score Interpretation Lower SD  Social Relationships and Interactions 23.99  Self-Concept and Feelings 17.79  Symptoms and Limitations 11.75  Fear and Uncertainty 8.96  Positive Adaptation 22.29  Job 2.08  Sexual Function 1.43  Intimate Mean 2.08   PHQ-9: Review Flowsheet  More data exists      12/01/2023 06/25/2022 03/04/2022 05/14/2018 01/06/2017  Depression screen PHQ 2/9  Decreased Interest 0 0 0 0 0  Down, Depressed, Hopeless 0 0 0 0 0  PHQ - 2 Score 0 0 0 0 0  Altered sleeping 0 1 1 0 -  Tired, decreased energy 2 1 1  0 -  Change in appetite 0 1 0 0 -  Feeling bad or failure about yourself  0 0 0 0 -  Trouble concentrating 0 1 1 0 -  Moving slowly or fidgety/restless 0 0 0 0 -  Suicidal thoughts 0 0 0 0 -  PHQ-9 Score 2 4 3  0 -  Difficult doing work/chores Not difficult at all Not difficult at all Somewhat difficult Not difficult at all -   Interpretation of Total Score  Total Score Depression Severity:  1-4 = Minimal depression, 5-9 = Mild depression, 10-14 = Moderate depression, 15-19 = Moderately severe depression, 20-27 = Severe depression   Psychosocial Evaluation and Intervention:  Psychosocial Evaluation - 12/01/23 0939       Psychosocial Evaluation & Interventions   Interventions Therapist referral;Physician referral;Stress management education;Encouraged to exercise with the program and follow exercise prescription    Comments Wolf can look to his wife who is a Engineer, civil (consulting) for his main support. He does not take anything for his mood.    Expected Outcomes Short: Attend [facility] stress management education to decrease stress. Long: Maintain exercise Post [facility] to keep stress at a minimum.    Continue Psychosocial Services  Follow up required by staff             Psychosocial Re-Evaluation:  Psychosocial  Re-Evaluation     Row Name 12/29/23 1742 02/11/24 1734           Psychosocial Re-Evaluation   Current issues with None Identified None Identified      Comments Patient reports no issues with their  current mental states, sleep, stress, depression or anxiety. Will follow up with patient in a few weeks for any changes. Eulon states that he is not feeling depressed and feels healthier than when he started. He states no mental instability and is not taking anything for his mood.      Expected Outcomes Short: Continue to exercise regularly to support mental health and notify staff of any changes. Long: maintain mental health and well being through teaching of rehab or prescribed medications independently. Short: continue with a healthy mental state. Long: maintain a healthy mental state independently.      Interventions Encouraged to attend Cardiac Rehabilitation for the exercise Encouraged to attend Cardiac Rehabilitation for the exercise      Continue Psychosocial Services  Follow up required by staff Follow up required by staff               Psychosocial Discharge (Final Psychosocial Re-Evaluation):  Psychosocial Re-Evaluation - 02/11/24 1734       Psychosocial Re-Evaluation   Current issues with None Identified    Comments Tajon states that he is not feeling depressed and feels healthier than when he started. He states no mental instability and is not taking anything for his mood.    Expected Outcomes Short: continue with a healthy mental state. Long: maintain a healthy mental state independently.    Interventions Encouraged to attend Cardiac Rehabilitation for the exercise    Continue Psychosocial Services  Follow up required by staff             Vocational Rehabilitation: Provide vocational rehab assistance to qualifying candidates.   Vocational Rehab Evaluation & Intervention:   Education: Education Goals: Education classes will be provided on a variety of topics geared  toward better understanding of heart and vascular health and risk factor modification. Participant will state understanding/return demonstration of topics presented as noted by education test scores.  Learning Barriers/Preferences:  Learning Barriers/Preferences - 12/01/23 0936       Learning Barriers/Preferences   Learning Barriers Sight    Learning Preferences None             Education Topics: General Nutrition Guidelines/Fats and Fiber: -Group instruction provided by verbal, written material, models and posters to present the general guidelines for heart healthy nutrition. Gives an explanation and review of dietary fats and fiber. Flowsheet Row Cardiac Rehab from 06/12/2022 in [facility] Cardiac and Pulmonary Rehab  Education need identified 03/04/22       Controlling Sodium/Reading Food Labels: -Group verbal and written material supporting the discussion of sodium use in heart healthy nutrition. Review and explanation with models, verbal and written materials for utilization of the food label.   Exercise Physiology & Risk Factors: - Group verbal and written instruction with models to review the exercise physiology of the cardiovascular system and associated critical values. Details cardiovascular disease risk factors and the goals associated with each risk factor. Flowsheet Row Cardiac Rehab from 06/12/2022 in [facility] Cardiac and Pulmonary Rehab  Education need identified 03/04/22       Aerobic Exercise & Resistance Training: - Gives group verbal and written discussion on the health impact of inactivity. On the components of aerobic and resistive training programs and the benefits of this training and how to safely progress through these programs.   Flexibility, Balance, General Exercise Guidelines: - Provides group verbal and written instruction on the benefits of flexibility and balance training programs. Provides general exercise guidelines with specific guidelines to those  with heart or  lung disease. Demonstration and skill practice provided. Flowsheet Row Cardiac Rehab from 06/12/2022 in [facility] Cardiac and Pulmonary Rehab  Date 06/12/22  Educator SB  Instruction Review Code 1- Verbalizes Understanding       Stress Management: - Provides group verbal and written instruction about the health risks of elevated stress, cause of high stress, and healthy ways to reduce stress.   Depression: - Provides group verbal and written instruction on the correlation between heart/lung disease and depressed mood, treatment options, and the stigmas associated with seeking treatment.   Anatomy & Physiology of the Heart: - Group verbal and written instruction and models provide basic cardiac anatomy and physiology, with the coronary electrical and arterial systems. Review of: AMI, Angina, Valve disease, Heart Failure, Cardiac Arrhythmia, Pacemakers, and the ICD. Flowsheet Row Cardiac Rehab from 06/12/2022 in [facility] Cardiac and Pulmonary Rehab  Education need identified 03/04/22       Cardiac Procedures: - Group verbal and written instruction to review commonly prescribed medications for heart disease. Reviews the medication, class of the drug, and side effects. Includes the steps to properly store meds and maintain the prescription regimen. (beta blockers and nitrates)   Cardiac Medications I: - Group verbal and written instruction to review commonly prescribed medications for heart disease. Reviews the medication, class of the drug, and side effects. Includes the steps to properly store meds and maintain the prescription regimen.   Cardiac Medications II: -Group verbal and written instruction to review commonly prescribed medications for heart disease. Reviews the medication, class of the drug, and side effects. (all other drug classes)    Go Sex-Intimacy & Heart Disease, Get SMART - Goal Setting: - Group verbal and written instruction through game format to discuss  heart disease and the return to sexual intimacy. Provides group verbal and written material to discuss and apply goal setting through the application of the S.M.A.R.T. Method.   Other Matters of the Heart: - Provides group verbal, written materials and models to describe Heart Failure, Angina, Valve Disease, Peripheral Artery Disease, and Diabetes in the realm of heart disease. Includes description of the disease process and treatment options available to the cardiac patient.   Exercise & Equipment Safety: - Individual verbal instruction and demonstration of equipment use and safety with use of the equipment. Flowsheet Row Cardiac Rehab from 12/01/2023 in [facility] Cardiac and Pulmonary Rehab  Date 12/01/23  Educator [facility]  Instruction Review Code 1- Verbalizes Understanding       Infection Prevention: - Provides verbal and written material to individual with discussion of infection control including proper hand washing and proper equipment cleaning during exercise session. Flowsheet Row Cardiac Rehab from 12/01/2023 in [facility] Cardiac and Pulmonary Rehab  Date 12/01/23  Educator [facility]  Instruction Review Code 1- Verbalizes Understanding       Falls Prevention: - Provides verbal and written material to individual with discussion of falls prevention and safety. Flowsheet Row Cardiac Rehab from 12/01/2023 in [facility] Cardiac and Pulmonary Rehab  Date 12/01/23  Educator [facility]  Instruction Review Code 1- Verbalizes Understanding       Diabetes: - Individual verbal and written instruction to review signs/symptoms of diabetes, desired ranges of glucose level fasting, after meals and with exercise. Acknowledge that pre and post exercise glucose checks will be done for 3 sessions at entry of program. Flowsheet Row Cardiac Rehab from 12/01/2023 in [facility] Cardiac and Pulmonary Rehab  Date 12/01/23  Educator [facility]  Instruction Review Code 1- Verbalizes Understanding  Other: -Provides group and verbal  instruction on various topics (see comments)    Knowledge Questionnaire Score:   Core Components/Risk Factors/Patient Goals at Admission:  Personal Goals and Risk Factors at Admission - 12/01/23 1558       Core Components/Risk Factors/Patient Goals on Admission    Weight Management Yes    Intervention Weight Management: Develop a combined nutrition and exercise program designed to reach desired caloric intake, while maintaining appropriate intake of nutrient and fiber, sodium and fats, and appropriate energy expenditure required for the weight goal.;Weight Management: Provide education and appropriate resources to help participant work on and attain dietary goals.;Weight Management/Obesity: Establish reasonable short term and long term weight goals.    Admit Weight 173 lb 8 oz (78.7 kg)    Goal Weight: Short Term 173 lb 8 oz (78.7 kg)    Goal Weight: Long Term 173 lb 8 oz (78.7 kg)    Expected Outcomes Short Term: Continue to assess and modify interventions until short term weight is achieved;Weight Maintenance: Understanding of the daily nutrition guidelines, which includes 25-35% calories from fat, 7% or less cal from saturated fats, less than 200mg  cholesterol, less than 1.5gm of sodium, & 5 or more servings of fruits and vegetables daily;Understanding recommendations for meals to include 15-35% energy as protein, 25-35% energy from fat, 35-60% energy from carbohydrates, less than 200mg  of dietary cholesterol, 20-35 gm of total fiber daily;Understanding of distribution of calorie intake throughout the day with the consumption of 4-5 meals/snacks    Tobacco Cessation Yes    Intervention Assist the participant in steps to quit. Provide individualized education and counseling about committing to Tobacco Cessation, relapse prevention, and pharmacological support that can be provided by physician.;Education officer, environmental, assist with locating and accessing local/national Quit Smoking programs,  and support quit date choice.    Expected Outcomes Short Term: Will demonstrate readiness to quit, by selecting a quit date.;Short Term: Will quit all tobacco product use, adhering to prevention of relapse plan.;Long Term: Complete abstinence from all tobacco products for at least 12 months from quit date.    Diabetes Yes    Intervention Provide education about signs/symptoms and action to take for hypo/hyperglycemia.;Provide education about proper nutrition, including hydration, and aerobic/resistive exercise prescription along with prescribed medications to achieve blood glucose in normal ranges: Fasting glucose 65-99 mg/dL    Expected Outcomes Short Term: Participant verbalizes understanding of the signs/symptoms and immediate care of hyper/hypoglycemia, proper foot care and importance of medication, aerobic/resistive exercise and nutrition plan for blood glucose control.;Long Term: Attainment of HbA1C < 7%.    Hypertension Yes    Intervention Provide education on lifestyle modifcations including regular physical activity/exercise, weight management, moderate sodium restriction and increased consumption of fresh fruit, vegetables, and low fat dairy, alcohol moderation, and smoking cessation.;Monitor prescription use compliance.    Expected Outcomes Short Term: Continued assessment and intervention until BP is < 140/77mm HG in hypertensive participants. < 130/93mm HG in hypertensive participants with diabetes, heart failure or chronic kidney disease.;Long Term: Maintenance of blood pressure at goal levels.    Lipids Yes    Intervention Provide education and support for participant on nutrition & aerobic/resistive exercise along with prescribed medications to achieve LDL 70mg , HDL >40mg .    Expected Outcomes Short Term: Participant states understanding of desired cholesterol values and is compliant with medications prescribed. Participant is following exercise prescription and nutrition guidelines.;Long  Term: Cholesterol controlled with medications as prescribed, with individualized exercise RX and with personalized nutrition plan.  Value goals: LDL < 70mg , HDL > 40 mg.             Core Components/Risk Factors/Patient Goals Review:   Goals and Risk Factor Review     Row Name 12/29/23 1751 02/11/24 1731           Core Components/Risk Factors/Patient Goals Review   Personal Goals Review Tobacco Cessation Tobacco Cessation;Hypertension      Review Ron is doing well in the PAD program and is improving his stamina. He wants to try to quit smoking but is up to 4 cigarettes a day. He states he has the material to help him quit and is going to try to cut back. Rons blood pressure seems to be doing better than the last few weeks. His blood pressure was a little higher than it has normally been while on the treadmill. He does not complain of leg pain and can do the treadmill the whole duration of class unless he has to use the restroom. He states that he has about three cigarettes per day. He is trying to quit the habbit and has cut back alot. Ron states he is going to continue to work toward quitting.      Expected Outcomes Short: decrease tobacco use. Long: quit smoking. Short: reduce tobacco intake.Long: Quit smoking.               Core Components/Risk Factors/Patient Goals at Discharge (Final Review):   Goals and Risk Factor Review - 02/11/24 1731       Core Components/Risk Factors/Patient Goals Review   Personal Goals Review Tobacco Cessation;Hypertension    Review Rons blood pressure seems to be doing better than the last few weeks. His blood pressure was a little higher than it has normally been while on the treadmill. He does not complain of leg pain and can do the treadmill the whole duration of class unless he has to use the restroom. He states that he has about three cigarettes per day. He is trying to quit the habbit and has cut back alot. Ron states he is going to continue to work  toward quitting.    Expected Outcomes Short: reduce tobacco intake.Long: Quit smoking.             ITP Comments:  ITP Comments     Row Name 12/01/23 1610 12/01/23 1541 12/04/23 1750 12/10/23 0850 01/07/24 1129   ITP Comments Virtual Visit completed. Patient informed on EP and RD appointment and 6 Minute walk test. Patient also informed of patient health questionnaires on My Chart. Patient Verbalizes understanding. Visit diagnosis can be found in [facility] 11/14/2023. Completed and gym orientation. Initial ITP created and sent for review to Dr. Firman Hughes, Medical Director. Ron is a current tobacco user. Intervention for tobacco cessation was provided at the initial medical review.He was asked about readiness to quit and reported he doesn't smoke that much, just occationally. Patient was advised and educated about tobacco cessation using combination therapy, tobacco cessation classes, quit line, and quit smoking apps. Patient demonstrated understanding of this material. Staff will continue to provide encouragement and follow up with the patient throughout the program. First full day of exercise!  Patient was oriented to gym and equipment including functions, settings, policies, and procedures.  Patient's individual exercise prescription and treatment plan were reviewed.  All starting workloads were established based on the results of the 6 minute walk test done at initial orientation visit.  The plan for exercise progression was also introduced  and progression will be customized based on patient's performance and goals. 30 Day review completed. Medical Director ITP review done, changes made as directed, and signed approval by Medical Director. New to program. 30 Day review completed. Medical Director ITP review done, changes made as directed, and signed approval by Medical Director.    Row Name 02/04/24 1307 02/19/24 1728         ITP Comments 30 Day review completed. Medical Director ITP review  done, changes made as directed, and signed approval by Medical Director. Andrius graduated today from  rehab with 34 sessions completed.  Details of the patient's exercise prescription and what He needs to do in order to continue the prescription and progress were discussed with patient.  Patient was given a copy of prescription and goals.  Patient verbalized understanding. Clive plans to continue to exercise by walking.               Comments: Discharge ITP

## 2024-02-25 ENCOUNTER — Encounter

## 2024-02-26 ENCOUNTER — Encounter

## 2024-03-01 ENCOUNTER — Encounter

## 2024-03-03 ENCOUNTER — Encounter

## 2024-03-03 ENCOUNTER — Telehealth: Payer: Self-pay | Admitting: Cardiovascular Disease

## 2024-03-03 NOTE — Telephone Encounter (Signed)
 Pt c/o medication issue:  1. Name of Medication: Evolocumab  (REPATHA  SURECLICK) 140 MG/ML SOAJ   2. How are you currently taking this medication (dosage and times per day)? As written   3. Are you having a reaction (difficulty breathing--STAT)? No   4. What is your medication issue? Pt called in stating this medication is around $100 for every refill. He asked if he could switch to Ezetimibe  or any other cholesterol medication that would be cheaper. Please advise.

## 2024-03-05 NOTE — Telephone Encounter (Signed)
 Ezetimibe  is not going to be strong enough to lower his cholesterol.  Is he willing to try a different statin?  Does he qualify for assistance on Repatha ?

## 2024-03-05 NOTE — Telephone Encounter (Signed)
 Left a message for the patient to call back.

## 2024-03-11 NOTE — Telephone Encounter (Signed)
 Left a message for the patient to call back.

## 2024-03-19 NOTE — Telephone Encounter (Signed)
 Spoke with the patient. He stated that he has assistance for Repatha  for the next six months. He will call back when it is closer to running out.

## 2024-04-22 ENCOUNTER — Other Ambulatory Visit: Payer: Self-pay | Admitting: Nurse Practitioner

## 2024-05-10 ENCOUNTER — Other Ambulatory Visit: Payer: Self-pay | Admitting: Nurse Practitioner

## 2024-08-10 ENCOUNTER — Other Ambulatory Visit: Payer: Self-pay | Admitting: Nurse Practitioner

## 2024-08-11 NOTE — Telephone Encounter (Signed)
 Please contact pt for future appointment. Pt overdue for 6 month f/u.

## 2024-09-03 ENCOUNTER — Encounter: Payer: Self-pay | Admitting: Nurse Practitioner

## 2024-09-03 ENCOUNTER — Ambulatory Visit: Admitting: Nurse Practitioner

## 2024-09-03 NOTE — Progress Notes (Deleted)
 Office Visit    Patient Name: Daniel Fuentes Date of Encounter: 09/03/2024  Primary Care Provider:  Camelia Sherwood BIRCH, FNP Primary Cardiologist:  Evalene Lunger, MD  PV Cardiologist:  Deatrice Cage, MD   Chief Complaint    73 y.o. male   Past Medical History   Subjective   Past Medical History:  Diagnosis Date   CAD (coronary artery disease)    a. 01/2022 NSTEMI/PCI: LM nl, LAD 24m, D1 50, D3 90, LCX 24m, 95/60d, OM2 80, RCA 100p/m, 68m, 70d (3.5x12, 3.0x38, & 3.5x8 Onyx Frontier DES), RPAV1 90 (2.0x12 Onyx Frontier DES), RPAV2 99, RPL1 50, RPL2 99. EF 50-55%; b. 01/2022 Staged PCI: LCX (3.5x18 & 3.0x38 Onyx Frontier DES), OM2 (3.0x22 Onyx Frontier DES).   CKD (chronic kidney disease), stage II    Diabetes mellitus without complication (HCC)    Hyperlipidemia    Hypertension    Ischemic cardiomyopathy    a. 01/2022 Echo: EF 50-55%, mod LVH, GRI DD, mild basal-mid inf/infsept HK. Mildly reduced RV fxn, Mildly dil RA. Mild MR. MIld-mod TR. AoV sclerosis w/o stenosis.   Past Surgical History:  Procedure Laterality Date   BRAIN SURGERY  2017   Subdural Hematoma   COLONOSCOPY WITH PROPOFOL  N/A 05/30/2021   Procedure: COLONOSCOPY WITH PROPOFOL ;  Surgeon: Unk Corinn Skiff, MD;  Location: [facility] ENDOSCOPY;  Service: Gastroenterology;  Laterality: N/A;   CORONARY STENT INTERVENTION N/A 02/08/2022   Procedure: CORONARY STENT INTERVENTION;  Surgeon: Mady Bruckner, MD;  Location: [facility] INVASIVE CV LAB;  Service: Cardiovascular;  Laterality: N/A;   CORONARY STENT INTERVENTION N/A 02/11/2022   Procedure: CORONARY STENT INTERVENTION;  Surgeon: Cage Deatrice LABOR, MD;  Location: [facility] INVASIVE CV LAB;  Service: Cardiovascular;  Laterality: N/A;   LEFT HEART CATH AND CORONARY ANGIOGRAPHY N/A 02/08/2022   Procedure: LEFT HEART CATH AND CORONARY ANGIOGRAPHY;  Surgeon: Mady Bruckner, MD;  Location: [facility] INVASIVE CV LAB;  Service: Cardiovascular;  Laterality: N/A;    Allergies  Allergies   Allergen Reactions   Simvastatin Other (See Comments)    Muscle aches   Statins Other (See Comments)    Joint pain       History of Present Illness      73 y.o. y/o male with a h/o hypertension, hyperlipidemia, diabetes, stage II chronic kidney disease, subdural hematoma in the setting of head trauma status post evacuation, abdominal aortic aneurysm, tobacco abuse, and coronary artery disease. He presented to [facility] on Feb 08, 2022 with complaints of presyncope, diaphoresis, dyspnea, and chest pain. He was relatively hypotensive. Troponin was 966 and subsequently elevated to 18,478. Echo showed an EF of 50 to 55% with mild basal-mid inferior and inferoseptal hypokinesis. Catheterization showed an occluded right coronary artery with severe left circumflex and obtuse marginal disease. The RCA was felt to be the infarct vessel and this was successfully treated with 3 drug-eluting stents. The RPAV was also treated with a drug-eluting stent. He underwent staged PCI during the same hospitalization of the left circumflex/OM 2, with 2 stents placed in the left circumflex and 1 in the obtuse marginal. He was discharged home on aspirin , beta-blocker, Plavix , ARB, and Repatha  therapy (statin intolerant).      1.  CAD: s/p NSTEMI in 5/23 with finding of occluded RCA status post 3 drug-eluting stents to the RCA and DES to the RPAV as well.  Subsequently underwent staged intervention during the same hospitalization for severe left circumflex and obtuse marginal disease with an additional total of 3 drug-eluting  stents placed.  2.  Primary hypertension:  3.  Hyperlipidemia: Statin intolerant and managed with Repatha   4.  Ischemic cardiomyopathy: EF 50-55% with mild basal to mid inferior and inferoseptal hypokinesis at the time of his event in 2023.  5.  Type 2 diabetes mellitus: A1c Objective   Home Medications    Current Outpatient Medications  Medication Sig Dispense Refill   amLODipine   (NORVASC ) 10 MG tablet Take 1 tablet by mouth once daily 90 tablet 0   aspirin  EC 81 MG tablet Take 1 tablet (81 mg total) by mouth daily. 30 tablet 0   carvedilol  (COREG ) 6.25 MG tablet TAKE 1 TABLET BY MOUTH TWICE DAILY WITH A MEAL 60 tablet 0   Cholecalciferol (VITAMIN D3) 2000 units capsule Take 2,000 Units by mouth daily.     clopidogrel  (PLAVIX ) 75 MG tablet Take 1 tablet by mouth once daily with breakfast 90 tablet 3   Evolocumab  (REPATHA  SURECLICK) 140 MG/ML SOAJ Inject 140 mg into the skin every 14 (fourteen) days. 2 mL 11   losartan  (COZAAR ) 25 MG tablet Take 1 tablet by mouth once daily 90 tablet 3   metFORMIN  (GLUCOPHAGE ) 1000 MG tablet Take 1 tablet (1,000 mg total) by mouth daily. Start tomorrow     Multiple Vitamins-Minerals (MULTIVITAMIN WITH MINERALS) tablet Take 1 tablet by mouth daily.     nitroGLYCERIN  (NITROSTAT ) 0.4 MG SL tablet Place 1 tablet (0.4 mg total) under the tongue every 5 (five) minutes as needed for chest pain. 30 tablet 0   solifenacin (VESICARE) 10 MG tablet Take 10 mg by mouth daily.     tamsulosin  (FLOMAX ) 0.4 MG CAPS capsule Take 0.4 mg by mouth daily.     vitamin C (ASCORBIC ACID) 500 MG tablet Take 500 mg by mouth.     No current facility-administered medications for this visit.     Physical Exam    VS:  There were no vitals taken for this visit. , BMI There is no height or weight on file to calculate BMI.          GEN: Well nourished, well developed, in no acute distress. HEENT: normal. Neck: Supple, no JVD, carotid bruits, or masses. Cardiac: RRR, no murmurs, rubs, or gallops. No clubbing, cyanosis, edema.  Radials 2+/PT 2+ and equal bilaterally.  Respiratory:  Respirations regular and unlabored, clear to auscultation bilaterally. GI: Soft, nontender, nondistended, BS + x 4. MS: no deformity or atrophy. Skin: warm and dry, no rash. Neuro:  Strength and sensation are intact. Psych: Normal affect.  Accessory Clinical Findings    ECG  personally reviewed by me today -    *** - no acute changes.  Lab Results  Component Value Date   WBC 6.4 02/12/2022   HGB 12.4 (L) 02/12/2022   HCT 37.3 (L) 02/12/2022   MCV 87.8 02/12/2022   PLT 248 02/12/2022   Lab Results  Component Value Date   CREATININE 1.07 02/12/2022   BUN 16 02/12/2022   NA 135 02/12/2022   K 3.7 02/12/2022   CL 106 02/12/2022   CO2 22 02/12/2022   Lab Results  Component Value Date   ALT 11 03/13/2022   AST 29 03/13/2022   ALKPHOS 56 03/13/2022   BILITOT 0.9 03/13/2022   Lab Results  Component Value Date   CHOL 142 03/13/2022   HDL 49 03/13/2022   LDLCALC 82 03/13/2022   TRIG 53 03/13/2022   CHOLHDL 2.9 03/13/2022    Lab Results  Component Value Date  HGBA1C 5.9 (H) 02/08/2022   Lab Results  Component Value Date   TSH 0.983 08/21/2015       Assessment & Plan    1.  ***  Lonni Meager, NP 09/03/2024, 1:25 PM

## 2024-09-10 ENCOUNTER — Encounter: Payer: Self-pay | Admitting: Nurse Practitioner

## 2024-09-10 ENCOUNTER — Ambulatory Visit: Attending: Nurse Practitioner | Admitting: Nurse Practitioner

## 2024-09-10 VITALS — BP 100/60 | HR 59 | Ht 69.0 in | Wt 180.5 lb

## 2024-09-10 DIAGNOSIS — I10 Essential (primary) hypertension: Secondary | ICD-10-CM | POA: Diagnosis not present

## 2024-09-10 DIAGNOSIS — I714 Abdominal aortic aneurysm, without rupture, unspecified: Secondary | ICD-10-CM | POA: Diagnosis not present

## 2024-09-10 DIAGNOSIS — I739 Peripheral vascular disease, unspecified: Secondary | ICD-10-CM | POA: Diagnosis not present

## 2024-09-10 DIAGNOSIS — I255 Ischemic cardiomyopathy: Secondary | ICD-10-CM

## 2024-09-10 DIAGNOSIS — Z720 Tobacco use: Secondary | ICD-10-CM

## 2024-09-10 DIAGNOSIS — I2510 Atherosclerotic heart disease of native coronary artery without angina pectoris: Secondary | ICD-10-CM

## 2024-09-10 DIAGNOSIS — E119 Type 2 diabetes mellitus without complications: Secondary | ICD-10-CM

## 2024-09-10 DIAGNOSIS — E782 Mixed hyperlipidemia: Secondary | ICD-10-CM | POA: Diagnosis not present

## 2024-09-10 DIAGNOSIS — I2584 Coronary atherosclerosis due to calcified coronary lesion: Secondary | ICD-10-CM

## 2024-09-10 NOTE — Patient Instructions (Signed)
 Medication Instructions:  Your physician recommends that you continue on your current medications as directed. Please refer to the Current Medication list given to you today.   *If you need a refill on your cardiac medications before your next appointment, please call your pharmacy*  Lab Work: None ordered at this time   Follow-Up: At [facility], you and your health needs are our priority.  As part of our continuing mission to provide you with exceptional heart care, our providers are all part of one team.  This team includes your primary Cardiologist (physician) and Advanced Practice Providers or APPs (Physician Assistants and Nurse Practitioners) who all work together to provide you with the care you need, when you need it.  Your next appointment:   6 month(s)  Provider:   You may see Deatrice Cage, MD for PV schedule  We recommend signing up for the patient portal called MyChart.  Sign up information is provided on this After Visit Summary.  MyChart is used to connect with patients for Virtual Visits (Telemedicine).  Patients are able to view lab/test results, encounter notes, upcoming appointments, etc.  Non-urgent messages can be sent to your provider as well.   To learn more about what you can do with MyChart, go to [email or web address].

## 2024-09-10 NOTE — Progress Notes (Addendum)
 Office Visit    Patient Name: Daniel Fuentes Date of Encounter: 09/10/2024  Primary Care Provider:  [facility] [place] Primary Primary Cardiologist:  Daniel Cage, MD  PV Cardiologist:  Daniel Cage, MD   Chief Complaint    73 y.o. male with a h/o hypertension, hyperlipidemia, diabetes, stage II chronic kidney disease, subdural hematoma in the setting of head trauma status post evacuation, abdominal aortic aneurysm, PAD, tobacco abuse, and coronary artery disease, who presents for CAD and PAD follow-up.  Past Medical History   Subjective   Past Medical History:  Diagnosis Date   AAA (abdominal aortic aneurysm)    a. 12/2023 U/S: 3.2 cm distal AAA.   Adrenal nodule (left)    Bilateral renal masses    a. 06/2024 noted on MRI Abd-->ongoing surveillance [facility]).   CAD (coronary artery disease)    a. 01/2022 NSTEMI/PCI: LM nl, LAD 44m, D1 50, D3 90, LCX 30m, 95/60d, OM2 80, RCA 100p/m, 52m, 70d (3.5x12, 3.0x38, & 3.5x8 Onyx Frontier DES), RPAV1 90 (2.0x12 Onyx Frontier DES), RPAV2 99, RPL1 50, RPL2 99. EF 50-55%; b. 01/2022 Staged PCI: LCX (3.5x18 & 3.0x38 Onyx Frontier DES), OM2 (3.0x22 Onyx Frontier DES).   CKD (chronic kidney disease), stage II    Diabetes mellitus without complication (HCC)    Elevated PSA    Hyperlipidemia    Hypertension    Ischemic cardiomyopathy    a. 01/2022 Echo: EF 50-55%, mod LVH, GRI DD, mild basal-mid inf/infsept HK. Mildly reduced RV fxn, Mildly dil RA. Mild MR. MIld-mod TR. AoV sclerosis w/o stenosis.   PAD (peripheral artery disease)    a. 05/2022 ABI/LE US : R 0.6 - occluded R SFA/popliteal, L 0.61 - 30-40% stenosis L SFA; b. 12/2023 Abd U/S: RCIA/LEIA >50% stenoses.   Past Surgical History:  Procedure Laterality Date   BRAIN SURGERY  2017   Subdural Hematoma   COLONOSCOPY WITH PROPOFOL  N/A 05/30/2021   Procedure: COLONOSCOPY WITH PROPOFOL ;  Surgeon: Daniel Corinn Skiff, MD;  Location: [facility] ENDOSCOPY;  Service: Gastroenterology;  Laterality:  N/A;   CORONARY STENT INTERVENTION N/A 02/08/2022   Procedure: CORONARY STENT INTERVENTION;  Surgeon: Daniel Bruckner, MD;  Location: [facility] INVASIVE CV LAB;  Service: Cardiovascular;  Laterality: N/A;   CORONARY STENT INTERVENTION N/A 02/11/2022   Procedure: CORONARY STENT INTERVENTION;  Surgeon: Daniel Daniel LABOR, MD;  Location: [facility] INVASIVE CV LAB;  Service: Cardiovascular;  Laterality: N/A;   LEFT HEART CATH AND CORONARY ANGIOGRAPHY N/A 02/08/2022   Procedure: LEFT HEART CATH AND CORONARY ANGIOGRAPHY;  Surgeon: Daniel Bruckner, MD;  Location: [facility] INVASIVE CV LAB;  Service: Cardiovascular;  Laterality: N/A;    Allergies  Allergies  Allergen Reactions   Simvastatin Other (See Comments)    Muscle aches   Statins Other (See Comments)    Joint pain       History of Present Illness      73 y.o. y/o male with a h/o hypertension, hyperlipidemia, diabetes, stage II chronic kidney disease, subdural hematoma in the setting of head trauma status post evacuation, abdominal aortic aneurysm, PAD, tobacco abuse, and coronary artery disease. He presented to [facility] on Feb 08, 2022 with complaints of presyncope, diaphoresis, dyspnea, and chest pain. He was relatively hypotensive. Troponin was 966 and subsequently elevated to 18,478. Echo showed an EF of 50 to 55% with mild basal-mid inferior and inferoseptal hypokinesis. Catheterization showed an occluded right coronary artery with severe left circumflex and obtuse marginal disease. The RCA was felt to be the infarct vessel  and this was successfully treated with 3 drug-eluting stents. The RPAV was also treated with a drug-eluting stent. He underwent staged PCI during the same hospitalization of the left circumflex/OM 2, with 2 stents placed in the left circumflex and 1 in the obtuse marginal. He was discharged home on aspirin , beta-blocker, Plavix , ARB, and Repatha  therapy (statin intolerant).   Aortic and lower extremity arterial duplex performed  in August 2023 showed 3.1 cm abdominal aortic aneurysm and greater than 50% bilateral common iliac artery stenoses.  ABIs in 05/2022 were abnl bilat @ 0.6.  He has since been seen by Dr. Darron, last seen in 11/2023, at which time he reported some worsening of left lower extremity claudication with recommendation for structured exercise program.  Follow-up abdominal ultrasound in February 2025 showed stable abdominal aortic aneurysm measuring 3.2 cm with persistent greater than 50% bilateral common iliac artery stenoses.   Following his visit in February 2025, Daniel Fuentes joined a gym and now has Geophysicist/field seismologist.  That said, he has not gone to the gym in about 3 months.  He does walk outside of his home and says that he can usually walk about a quarter of a mile prior to noticing some left calf discomfort but as he continues, by a third of a mile, he does not notice it anymore and he will continue on for a total of about 1 mile most days of the week.  He does not have any rest discomfort.  He denies chest pain, palpitations, dyspnea, pnd, orthopnea, n, v, dizziness, syncope, edema, weight gain, or early satiety.  Objective   Home Medications    Current Outpatient Medications  Medication Sig Dispense Refill   amLODipine  (NORVASC ) 10 MG tablet Take 1 tablet by mouth once daily 90 tablet 0   aspirin  EC 81 MG tablet Take 1 tablet (81 mg total) by mouth daily. 30 tablet 0   carvedilol  (COREG ) 6.25 MG tablet TAKE 1 TABLET BY MOUTH TWICE DAILY WITH A MEAL 60 tablet 0   Cholecalciferol (VITAMIN D3) 2000 units capsule Take 2,000 Units by mouth daily.     clopidogrel  (PLAVIX ) 75 MG tablet Take 1 tablet by mouth once daily with breakfast 90 tablet 3   Evolocumab  (REPATHA  SURECLICK) 140 MG/ML SOAJ Inject 140 mg into the skin every 14 (fourteen) days. 2 mL 11   losartan  (COZAAR ) 25 MG tablet Take 1 tablet by mouth once daily 90 tablet 3   metFORMIN  (GLUCOPHAGE ) 1000 MG tablet Take 1 tablet (1,000 mg total) by mouth  daily. Start tomorrow     Multiple Vitamins-Minerals (MULTIVITAMIN WITH MINERALS) tablet Take 1 tablet by mouth daily.     nitroGLYCERIN  (NITROSTAT ) 0.4 MG SL tablet Place 1 tablet (0.4 mg total) under the tongue every 5 (five) minutes as needed for chest pain. 30 tablet 0   solifenacin (VESICARE) 10 MG tablet Take 10 mg by mouth daily.     tamsulosin  (FLOMAX ) 0.4 MG CAPS capsule Take 0.4 mg by mouth daily.     vitamin C (ASCORBIC ACID) 500 MG tablet Take 500 mg by mouth.     No current facility-administered medications for this visit.     Physical Exam    VS:  BP 100/60 (BP Location: Left Arm, Patient Position: Sitting, Cuff Size: Normal)   Pulse (!) 59   Ht 5' 9 (1.753 m)   Wt 180 lb 8 oz (81.9 kg)   SpO2 98%   BMI 26.66 kg/m  , BMI Body mass index is  26.66 kg/m.          GEN: Well nourished, well developed, in no acute distress. HEENT: normal. Neck: Supple, no JVD, carotid bruits, or masses. Cardiac: RRR, no murmurs, rubs, or gallops. No clubbing, cyanosis, edema.  Radials 2+/PT 1+ and equal bilaterally.  Respiratory:  Respirations regular and unlabored, clear to auscultation bilaterally. GI: Soft, nontender, nondistended, BS + x 4. MS: no deformity or atrophy. Skin: warm and dry, no rash. Neuro:  Strength and sensation are intact. Psych: Normal affect.  Accessory Clinical Findings    ECG personally reviewed by me today - EKG Interpretation Date/Time:  Friday September 10 2024 15:30:13 EST Ventricular Rate:  59 PR Interval:  174 QRS Duration:  158 QT Interval:  480 QTC Calculation: 475 R Axis:   -43  Text Interpretation: Sinus bradycardia Left axis deviation Right bundle branch block Confirmed by Vivienne Fuentes [phone]) on 09/10/2024 3:36:54 PM  - no acute changes.  Labs dated August 13, 2024 from [facility]:  Sodium 141, potassium 3.7, chloride 102, CO2 27, BUN 13, creatinine 0.91, glucose 140  Labs dated July 30, 2024 from [facility]:  Total  bilirubin 0.7, alkaline phosphatase 83, AST 36, ALT 12 Total protein 7.4, albumin 3.8, calcium  10.2 Hemoglobin 13.6, hematocrit 40.9, WBC 4.0, platelets 330 Hemoglobin A1c 5.8  Labs dated April 23, 2024 [facility]:   Total cholesterol 163, triglycerides 170, HDL 53, LDL 84 TSH 1.509    Assessment & Plan    1.  .  CAD: s/p NSTEMI in 01/2022 w/ finding of occluded RCA status post 3 drug-eluting stents to the RCA and DES to the RPA V as well.  Subsequently underwent staged intervention during the same hospitalization for severe left circumflex and obtuse marginal disease with an additional total of 3 drug-eluting stents placed.  He has remained relatively active without chest pain or dyspnea.  Continue aspirin , beta-blocker, ARB, and PCSK9 inhibitor (statin intolerant).  2.  Primary hypertension: Stable and actually soft today but asymptomatic.  He says historically he runs higher.  Continue carvedilol , amlodipine , and losartan .  3.  Hyperlipidemia: Statin intolerant and managed with Repatha .  LDL of 84 in July 2025 with normal LFTs in October.  Encouraged regular exercise and more plant-based diet.  4.  Ischemic cardiomyopathy: EF 50-55% with mild basal to mid inferior and inferoseptal hypokinesis at the time of his event in 2023.  Euvolemic on examination and asymptomatic.  He remains on beta-blocker and ARB therapy.  5.  Type 2 diabetes mellitus: A1c of 5.8 in October.  He is on metformin , ARB, and Repatha .  6.  Bilateral renal masses/L adrenal mass: Recent incidental findings on MRI.  Followed by urology and endocrinology at [facility] with plan for follow-up imaging in the future.  7.  Peripheral arterial disease/left lower extremity claudication: Prior ABI of 0.6 on the left with bilateral common iliac stenoses greater than 50% on duplex.  Overall, he notes stable left calf claudication that comes on after walking about 1/4 mile but then settles out and or disappears by a third of a mile.  He  has been walking a mile several days a week and does not feel particularly limited in his activity.  Continue medical therapy including dual antiplatelet therapy and Repatha .  8.  Abdominal aortic aneurysm: Stable at 3.2 cm by duplex in February 2025.  Will be due for follow-up early next year.  9.  Tobacco abuse: Continues to smoke a few cigarettes a day.  Complete cessation  advised.  10.  Disposition: Follow-up with Dr. Darron in 6 months or sooner if necessary.  Lonni Meager, NP 09/10/2024, 4:55 PM

## 2024-10-04 ENCOUNTER — Other Ambulatory Visit: Payer: Self-pay | Admitting: Cardiovascular Disease

## 2024-10-06 MED ORDER — CARVEDILOL 6.25 MG PO TABS: 6.2500 mg | ORAL_TABLET | Freq: Two times a day (BID) | ORAL | 0 refills | Status: AC

## 2024-10-21 ENCOUNTER — Telehealth: Payer: Self-pay | Admitting: *Deleted

## 2024-10-21 NOTE — Telephone Encounter (Signed)
 Left a message for the patient to call back concerning a form that he filled out. This form was not completed and will need the back filled out.

## 2024-10-27 NOTE — Telephone Encounter (Signed)
 Patient filled out a daisy award application but did not fill out the whole form. The form will be mailed to him for completion.

## 2024-11-24 ENCOUNTER — Ambulatory Visit: Admitting: Cardiovascular Disease
